# Patient Record
Sex: Male | Born: 1970 | Hispanic: No | Marital: Married | Smoking: Never smoker
Health system: Southern US, Community
[De-identification: ages and names within clinical notes are randomized; demographics above are authoritative.]

## PROBLEM LIST (undated history)

## (undated) DIAGNOSIS — I219 Acute myocardial infarction, unspecified: Secondary | ICD-10-CM

## (undated) DIAGNOSIS — E785 Hyperlipidemia, unspecified: Secondary | ICD-10-CM

## (undated) DIAGNOSIS — Z9289 Personal history of other medical treatment: Secondary | ICD-10-CM

## (undated) DIAGNOSIS — S93699A Other sprain of unspecified foot, initial encounter: Secondary | ICD-10-CM

## (undated) DIAGNOSIS — I10 Essential (primary) hypertension: Secondary | ICD-10-CM

## (undated) DIAGNOSIS — M109 Gout, unspecified: Secondary | ICD-10-CM

## (undated) DIAGNOSIS — I2510 Atherosclerotic heart disease of native coronary artery without angina pectoris: Secondary | ICD-10-CM

## (undated) DIAGNOSIS — Z720 Tobacco use: Secondary | ICD-10-CM

## (undated) HISTORY — PX: ORIF ELBOW FRACTURE: SUR928

## (undated) HISTORY — DX: Hyperlipidemia, unspecified: E78.5

## (undated) HISTORY — DX: Essential (primary) hypertension: I10

## (undated) HISTORY — PX: REPLACEMENT TOTAL KNEE: SUR1224

## (undated) HISTORY — DX: Atherosclerotic heart disease of native coronary artery without angina pectoris: I25.10

## (undated) HISTORY — PX: KNEE ARTHROSCOPY: SUR90

## (undated) HISTORY — DX: Personal history of other medical treatment: Z92.89

## (undated) HISTORY — PX: ORIF FEMUR FRACTURE: SHX2119

## (undated) HISTORY — DX: Other sprain of unspecified foot, initial encounter: S93.699A

## (undated) HISTORY — DX: Acute myocardial infarction, unspecified: I21.9

---

## 2001-01-26 HISTORY — PX: SHOULDER SURGERY: SHX246

## 2004-06-21 ENCOUNTER — Emergency Department: Payer: Self-pay | Admitting: Emergency Medicine

## 2004-07-10 ENCOUNTER — Ambulatory Visit: Payer: Self-pay | Admitting: Family Medicine

## 2004-09-16 ENCOUNTER — Inpatient Hospital Stay (HOSPITAL_COMMUNITY): Admission: RE | Admit: 2004-09-16 | Discharge: 2004-09-22 | Payer: Self-pay | Admitting: Specialist

## 2004-09-16 ENCOUNTER — Ambulatory Visit: Payer: Self-pay | Admitting: Internal Medicine

## 2005-11-08 ENCOUNTER — Emergency Department: Payer: Self-pay | Admitting: Emergency Medicine

## 2005-11-18 ENCOUNTER — Ambulatory Visit: Payer: Self-pay | Admitting: Family Medicine

## 2005-12-26 DIAGNOSIS — I10 Essential (primary) hypertension: Secondary | ICD-10-CM

## 2005-12-26 HISTORY — DX: Essential (primary) hypertension: I10

## 2006-01-11 ENCOUNTER — Ambulatory Visit: Payer: Self-pay | Admitting: Family Medicine

## 2006-02-15 ENCOUNTER — Ambulatory Visit: Payer: Self-pay | Admitting: Family Medicine

## 2006-02-15 LAB — CONVERTED CEMR LAB
BUN: 9 mg/dL (ref 6–23)
CO2: 28 meq/L (ref 19–32)
Calcium: 9.5 mg/dL (ref 8.4–10.5)
Chloride: 106 meq/L (ref 96–112)
Creatinine, Ser: 1.1 mg/dL (ref 0.4–1.5)
GFR calc Af Amer: 97 mL/min
GFR calc non Af Amer: 80 mL/min
Glucose, Bld: 103 mg/dL — ABNORMAL HIGH (ref 70–99)
Potassium: 4.9 meq/L (ref 3.5–5.1)
Sodium: 141 meq/L (ref 135–145)

## 2006-02-22 ENCOUNTER — Ambulatory Visit: Payer: Self-pay | Admitting: Family Medicine

## 2006-05-06 ENCOUNTER — Encounter: Payer: Self-pay | Admitting: Family Medicine

## 2006-05-06 DIAGNOSIS — R7303 Prediabetes: Secondary | ICD-10-CM | POA: Insufficient documentation

## 2006-05-06 DIAGNOSIS — I10 Essential (primary) hypertension: Secondary | ICD-10-CM | POA: Insufficient documentation

## 2006-05-17 ENCOUNTER — Ambulatory Visit: Payer: Self-pay | Admitting: Family Medicine

## 2006-05-17 LAB — CONVERTED CEMR LAB
Glucose, Bld: 90 mg/dL (ref 70–99)
Potassium: 4.3 meq/L (ref 3.5–5.1)

## 2006-05-25 ENCOUNTER — Ambulatory Visit: Payer: Self-pay | Admitting: Family Medicine

## 2006-06-01 ENCOUNTER — Telehealth (INDEPENDENT_AMBULATORY_CARE_PROVIDER_SITE_OTHER): Payer: Self-pay | Admitting: *Deleted

## 2006-06-11 ENCOUNTER — Inpatient Hospital Stay (HOSPITAL_COMMUNITY): Admission: RE | Admit: 2006-06-11 | Discharge: 2006-06-15 | Payer: Self-pay | Admitting: Specialist

## 2006-07-07 ENCOUNTER — Encounter: Payer: Self-pay | Admitting: Specialist

## 2006-07-27 ENCOUNTER — Encounter: Payer: Self-pay | Admitting: Specialist
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
## 2008-03-17 ENCOUNTER — Emergency Department: Payer: Self-pay | Admitting: Emergency Medicine

## 2008-03-17 ENCOUNTER — Encounter: Payer: Self-pay | Admitting: Family Medicine

## 2008-03-21 ENCOUNTER — Ambulatory Visit: Payer: Self-pay | Admitting: Family Medicine

## 2008-03-21 DIAGNOSIS — K219 Gastro-esophageal reflux disease without esophagitis: Secondary | ICD-10-CM | POA: Insufficient documentation

## 2008-03-29 IMAGING — CR DG CHEST 2V
2 series · 2 of 2 positions shown · non-contrast
Comparison: 09/11/04.

CLINICAL DATA: Knee osteoarthritis.  Hypertension.   Preoperative respiratory exam for knee joint replacement. 
 CHEST - 2 VIEW:

[view not recorded (1 of 2)]
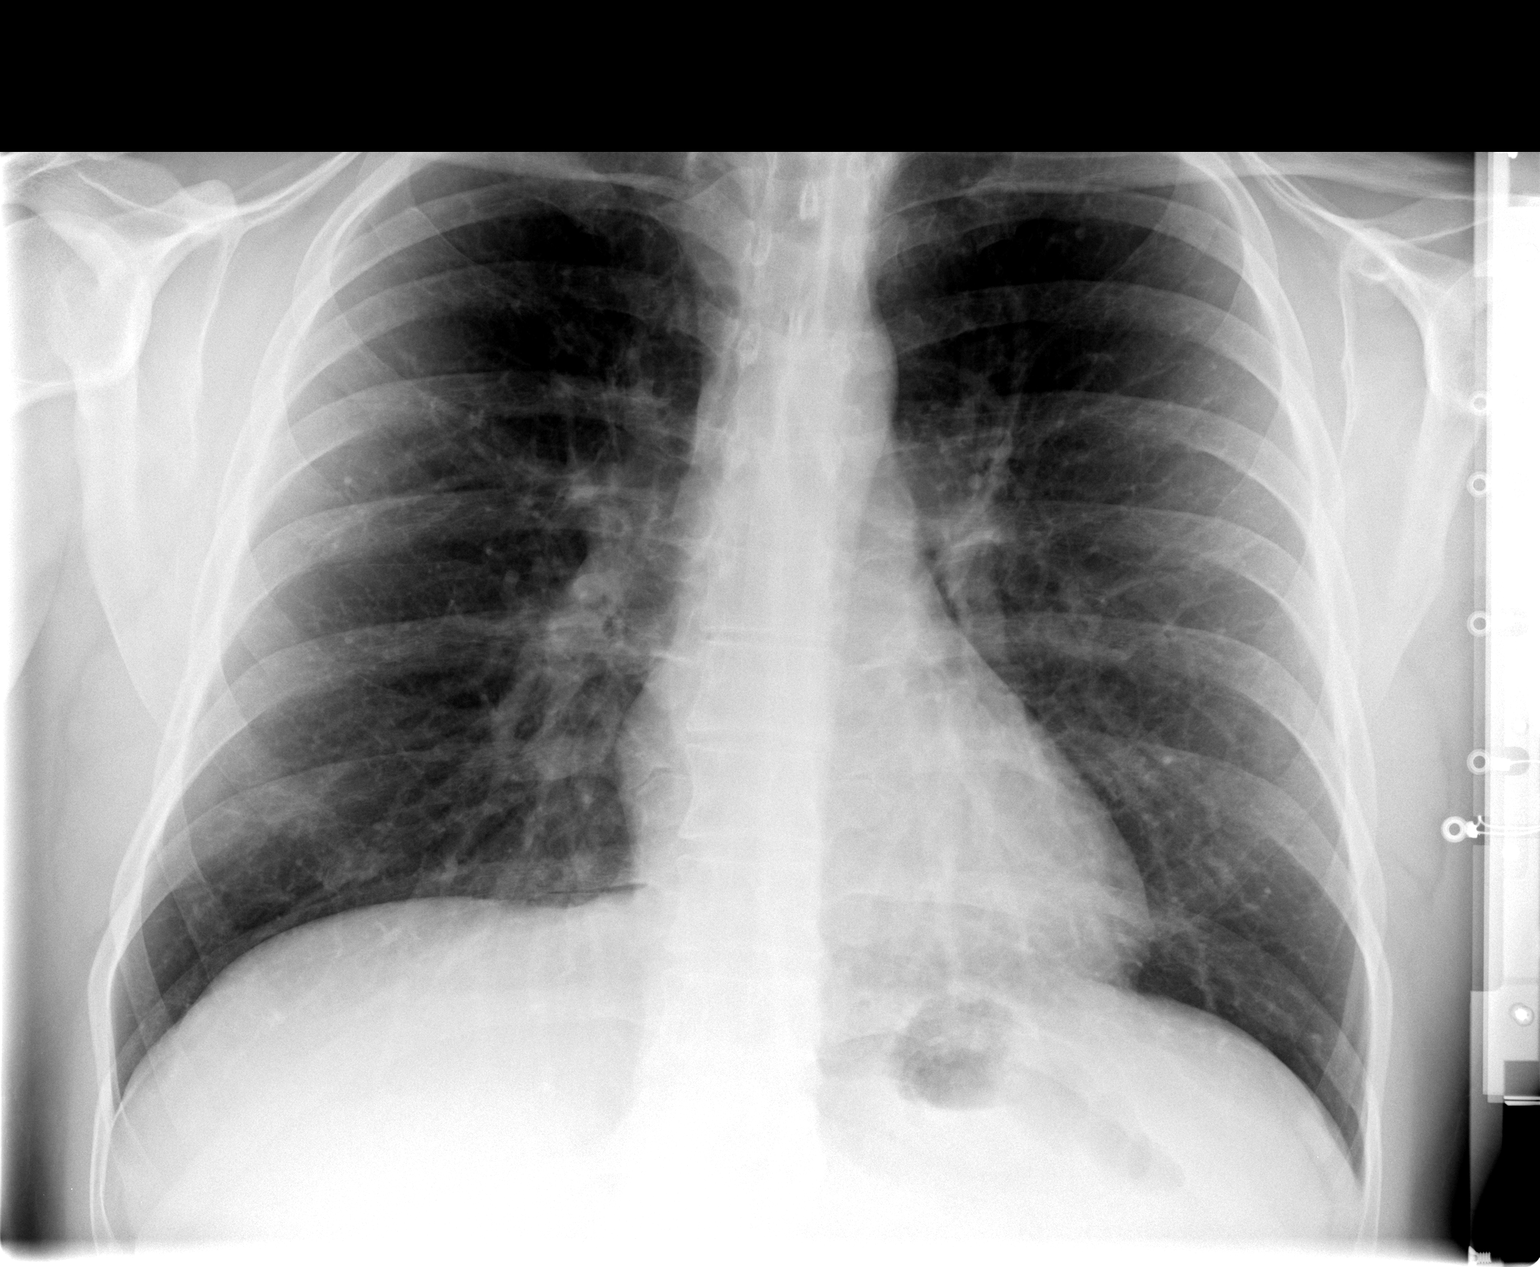

[view not recorded (2 of 2)]
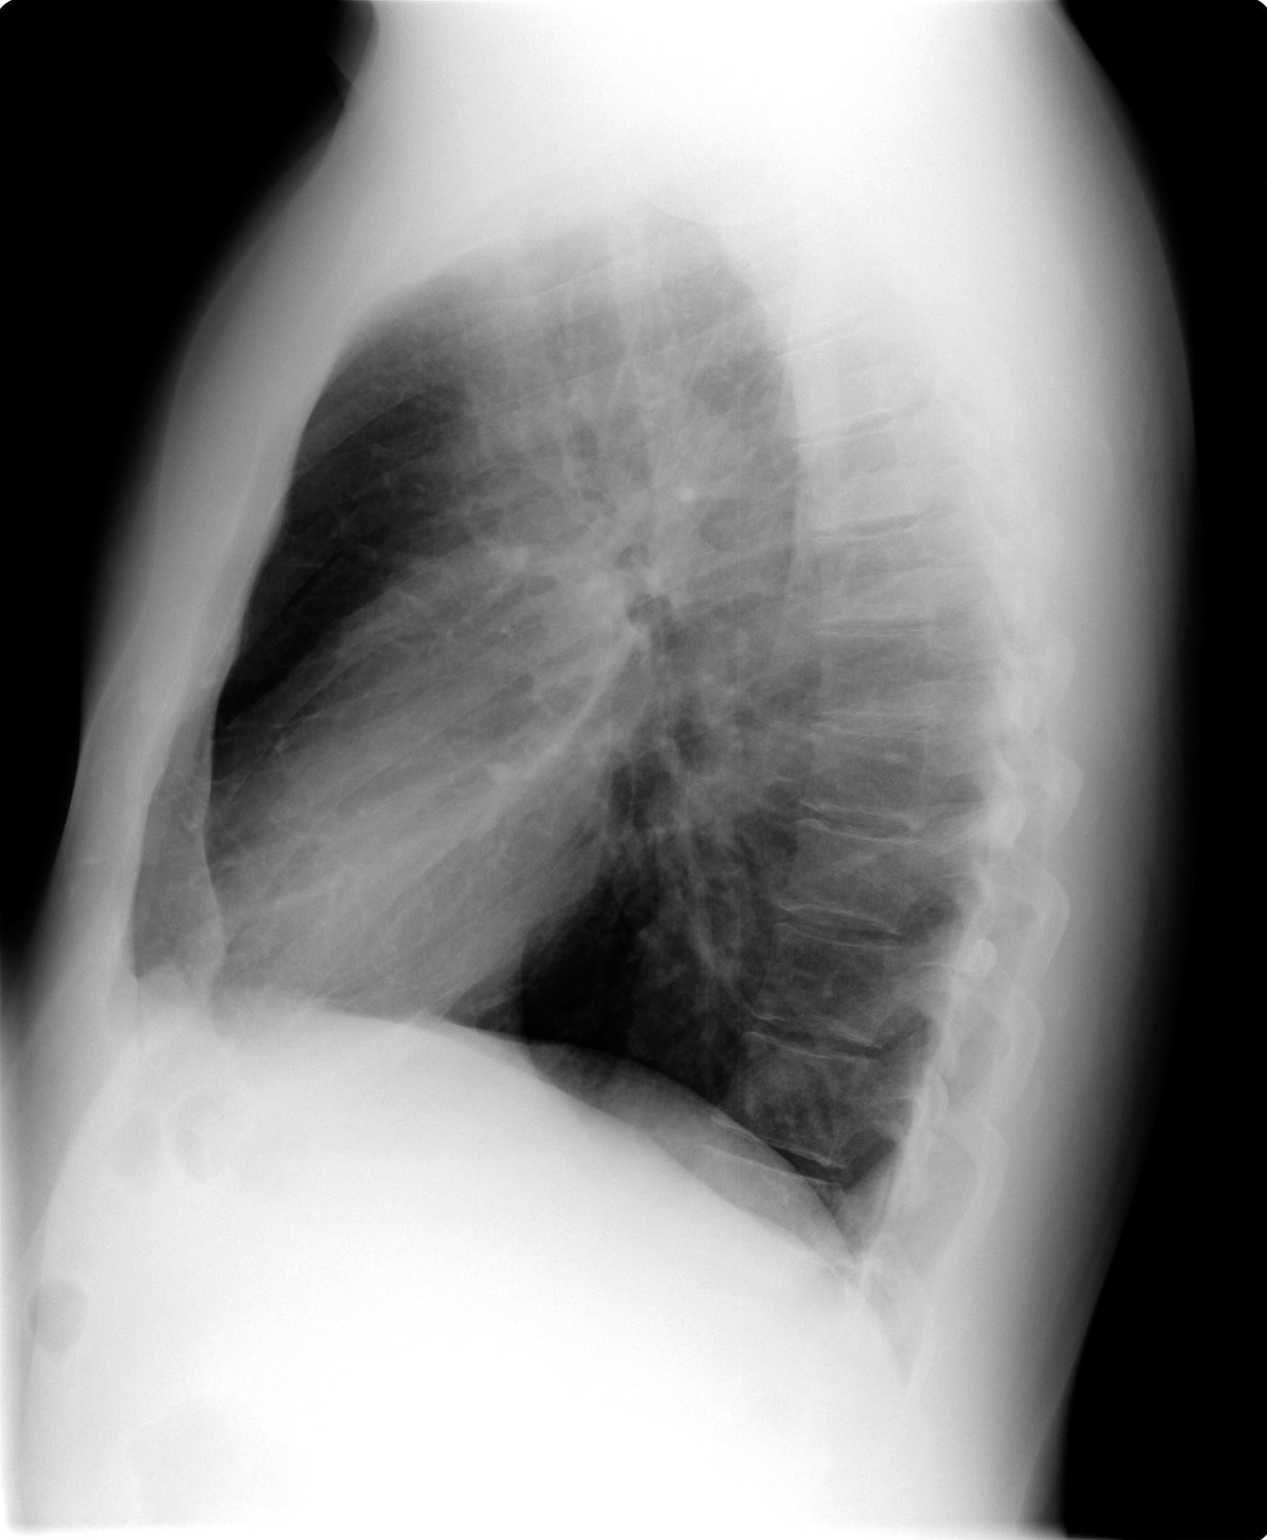

[2 of 2 positions shown; findings below may reference images not displayed]

FINDINGS: The heart size and mediastinal contours are within normal limits.  Both lungs are clear.  The visualized skeletal structures are unremarkable.
IMPRESSION: No active cardiopulmonary disease.

## 2008-12-31 ENCOUNTER — Ambulatory Visit: Payer: Self-pay | Admitting: Family Medicine

## 2009-07-01 ENCOUNTER — Ambulatory Visit: Payer: Self-pay | Admitting: Family Medicine

## 2009-07-01 LAB — CONVERTED CEMR LAB
ALT: 29 units/L (ref 0–53)
AST: 32 units/L (ref 0–37)
Albumin: 4.1 g/dL (ref 3.5–5.2)
Alkaline Phosphatase: 72 units/L (ref 39–117)
BUN: 14 mg/dL (ref 6–23)
Bilirubin, Direct: 0.1 mg/dL (ref 0.0–0.3)
CO2: 30 meq/L (ref 19–32)
Calcium: 9.1 mg/dL (ref 8.4–10.5)
Chloride: 106 meq/L (ref 96–112)
Cholesterol: 168 mg/dL (ref 0–200)
Creatinine, Ser: 0.9 mg/dL (ref 0.4–1.5)
GFR calc non Af Amer: 97.63 mL/min (ref >60)
Glucose, Bld: 87 mg/dL (ref 70–99)
HDL: 37.9 mg/dL — ABNORMAL LOW (ref >39.00)
LDL Cholesterol: 103 mg/dL — ABNORMAL HIGH (ref 0–99)
Potassium: 4.8 meq/L (ref 3.5–5.1)
Sodium: 141 meq/L (ref 135–145)
TSH: 1.4 microintl units/mL (ref 0.35–5.50)
Total Bilirubin: 0.6 mg/dL (ref 0.3–1.2)
Total CHOL/HDL Ratio: 4
Total Protein: 6.9 g/dL (ref 6.0–8.3)
Triglycerides: 138 mg/dL (ref 0.0–149.0)
VLDL: 27.6 mg/dL (ref 0.0–40.0)

## 2009-07-04 ENCOUNTER — Ambulatory Visit: Payer: Self-pay | Admitting: Family Medicine

## 2009-07-12 ENCOUNTER — Encounter: Payer: Self-pay | Admitting: Family Medicine

## 2009-08-28 ENCOUNTER — Encounter (INDEPENDENT_AMBULATORY_CARE_PROVIDER_SITE_OTHER): Payer: Self-pay | Admitting: *Deleted

## 2009-09-17 ENCOUNTER — Encounter: Payer: Self-pay | Admitting: Family Medicine

## 2009-11-12 ENCOUNTER — Encounter: Payer: Self-pay | Admitting: Family Medicine
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
## 2010-02-25 NOTE — Letter (Signed)
Summary: [facility]  [facility]   Imported By: Lanelle Bal 07/30/2009 12:12:05  _____________________________________________________________________  External Attachment:    Type:   Image     Comment:   External Document  Appended Document: [facility] Plantar fasciitis per [facility]

## 2010-02-25 NOTE — Letter (Signed)
Summary: [facility]  [facility]   Imported By: Lanelle Bal 11/25/2009 11:18:10  _____________________________________________________________________  External Attachment:    Type:   Image     Comment:   External Document
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
## 2010-02-25 NOTE — Letter (Signed)
Summary: Nadara Eaton letter  [facility]  [address] [place], [place] [address]   Phone: [phone]  Fax: [phone]       08/28/2009 MRN: [id number]  EDNA GROVER [address] [place], [place]  [address]  Dear Mr. [facility] [place], and [place] announce the retirement of Arta Silence, M.D., from full-time practice at the [facility] office effective July 25, 2009 and his plans of returning part-time.  It is important to Dr. Hetty Ely and to our practice that you understand that [facility] has seven physicians in our office for your health care needs.  We will continue to offer the same exceptional care that you have today.    Dr. Hetty Ely has spoken to many of you about his plans for retirement and returning part-time in the fall.   We will continue to work with you through the transition to schedule appointments for you in the office and meet the high standards that [place] is committed to.   Again, it is with great pleasure that we share the news that Dr. Hetty Ely will return to [facility] at [facility] in October of 2011 with a reduced schedule.    If you have any questions, or would like to request an appointment with one of our physicians, please call us at [phone] and press the option for Scheduling an appointment.  We take pleasure in providing you with excellent patient care and look forward to seeing you at your next office visit.  Our [facility] Physicians are:  Tillman Abide, M.D. Laurita Quint, M.D. Roxy Manns, M.D. Kerby Nora, M.D. Hannah Beat, M.D. Ruthe Mannan, M.D. We proudly welcomed Raechel Ache, M.D. and Eustaquio Boyden, M.D. to the practice in July/August 2011.  Sincerely,  [place] [facility]

## 2010-02-25 NOTE — Assessment & Plan Note (Signed)
Summary: EAR/CLE   Vital Signs:  Patient profile:   40 year old male Height:      69 inches Weight:      215.75 pounds BMI:     31.98 Temp:     97.8 degrees F oral Pulse rate:   76 / minute Pulse rhythm:   regular BP sitting:   140 / 90  (left arm) Cuff size:   large  Vitals Entered By: Sydell Axon LPN (December 31, 2008 2:46 PM)  History of Present Illness: Pt here for ears feeling congested and popping. He is gettting irritated with it and wants something to help. He wennt to [facility] 2 mos ago for right ear pain that was diagnosed with MJ on the right and left ear infection for which he got Abs. He denies fever or chills, headache, ear pain but is sounds occas like in a tunnel, lloud noise bothers him. He has minimal rhinitis, no ST, and no cough, no SOB, no N/V. He has taken nothing.  Problems Prior to Update: 1)  Gerd  (ICD-530.81) 2)  Chest Pain, Atypical  (ICD-786.59) 3)  Abnormal Glucose Nec  (ICD-790.29) 4)  Hypertension  (ICD-401.9)  Medications Prior to Update: 1)  None  Allergies: No Known Drug Allergies  Physical Exam  General:  Well-developed,well-nourished,in no acute distress; alert,appropriate and cooperative throughout examination, nontoxic appearing. Head:  Normocephalic and atraumatic without obvious abnormalities. No apparent alopecia or balding. Sinuses NT. Eyes:  Conjunctiva clear bilaterally.  Ears:  External ear exam shows no significant lesions or deformities.  Otoscopic examination reveals clear canals, tympanic membranes are intact bilaterally without bulging, retraction, inflammation or discharge. Hearing is grossly normal bilaterally. L TM mildly dull, mobility decreased on right. Nose:  External nasal examination shows no deformity or inflammation. Nasal mucosa are pink and moist without lesions or exudates. Mouth:  Oral mucosa and oropharynx without lesions or exudates.  Teeth in good repair. Mild white PND. Neck:  No deformities, masses,  or tenderness noted. Lungs:  Normal respiratory effort, chest expands symmetrically. Lungs are clear to auscultation, no crackles or wheezes. Heart:  Normal rate and regular rhythm. S1 and S2 normal without gallop, murmur, click, rub or other extra sounds.   Impression & Recommendations:  Problem # 1:  URI (ICD-465.9) Assessment New See instructions.  Problem # 2:  DYSFUNCTION OF EUSTACHIAN TUBE (ICD-381.81) Assessment: New See instructions.  Patient Instructions: 1)  Take Guaifenesin by going to [facility], [facility] or [facility] and getting MUCOUS RELIEF EXPECTORANT (400mg ), take 11/2 tabs by mouth AM and NOON. 2)  Drink lots of fluids anytime taking Guaifenesin.  3)  Gargle  4)  Take IBP 600mg  three times a day after meals. 5)  Afrin 3 squirts one minute apart three times a day for three days.  Prior Medications (reviewed today): None Current Allergies (reviewed today): No known allergies   Appended Document: EAR/CLE Make appt for Comp Exam in 6 mos, labs prior.
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
## 2010-02-25 NOTE — Letter (Signed)
Summary: [facility]  [facility]   Imported By: Lanelle Bal 10/09/2009 08:47:07  _____________________________________________________________________  External Attachment:    Type:   Image     Comment:   External Document

## 2010-06-13 NOTE — Discharge Summary (Signed)
Eric Huff, Eric Huff             ACCOUNT NO.:  [id number]   MEDICAL RECORD NO.:  [id number]          PATIENT TYPE:  INP   LOCATION:  1605                         FACILITY:  [facility]   PHYSICIAN:  Erasmo Leventhal, M.D.DATE OF BIRTH:  [birth year]   DATE OF ADMISSION:  06/11/2006  DATE OF DISCHARGE:  06/15/2006                               DISCHARGE SUMMARY   ADMISSION DIAGNOSIS:  End-stage osteoarthritis, right knee.   DISCHARGE DIAGNOSIS:  End-stage osteoarthritis, right knee.   OPERATION/PROCEDURE:  Total knee arthroplasty, right knee.   BRIEF HISTORY:  This is a 40 year old gentleman with a history of  traumatic arthritis of the right knee.  Has failed to respond to  conservative measures to alleviate his pain despite his young age.  He  is now a candidate for total knee arthroplasty as he has failed all  other treatment options.  We discussed treatment options, benefits and  risks with the patient extensively.  Questions were invited and  answered.  Surgery to go ahead as scheduled.  He has had a problem with  staph infections in the past and will cover with vancomycin both  preoperatively and postoperatively.   LABORATORY VALUES:  Admission CBC within normal limits.  He ran mildly  elevated white count at admission high of 13.1, back down to 11.7 at  discharge.  Hemoglobin/hematocrit reached a low of 11.5 and 33.3 on May  19,  were up to 11.06 and 34.1 on the May 20.  PT/PTT within normal  limits.  Admission CMET within normal limits with the exception of  slightly elevated glucose at 109.  He had mildly low sodium on the May  17 and May 18 at 134 and 133; was back up to normal on the May 19.  Ran  mildly high glucose through the admission in the 130-140 range.  Urinalysis within normal limits.   COURSE IN THE HOSPITAL:  The patient tolerated the operative seizure  well.  First postoperative day, vital signs stable.  Neurovascular  status intact.  Hemovac drain was  removed.  Dressing was dry and the  patient was started on total knee protocol.  Second postoperative day,  vital signs stable, afebrile.  Hemoglobin 12.1.  Dressing was changed.  Wound was healing well without redness.  No calf tenderness, cords or  signs of DVT and he was continued in total knee protocol.  Third  postoperative day, vital signs stable.  He was afebrile.  Temperature  max the night before 101.3.  WBC 13.1, hemoglobin 11.5, hematocrit 33.3.  BMET within normal limits with the exception of glucose high at 141.  Lungs clear with decreased sounds at the right base.  Bowel sounds  active.  Heart sounds normal.  Dressing changed.  Wound benign.  Calves  negative.  He was started on incentive spirometry.  Cough and deep  breathe q. 30 minutes and CBC was rechecked in the a.m..  And on the  fourth postoperative day, vital signs were stable.  Temperature 99.2,  max 100 the previous evening.  Oxygen saturation 97% on room air.  White  count down to 11.7,  hematocrit and hemoglobin stable.  Lungs clear with  good sounds in all fields.  Bowel sounds active.  Heart sounds normal.  Calves negative.  Wound benign.  The patient was subsequently discharged  home for followup in the office.   CONDITION ON DISCHARGE:  Improved.   DISCHARGE MEDICATIONS:  1. Percocet 5/325 one to two q.6 h p.r.n. pain.  2. Robaxin 500 one p.o. daily p.r.n. spasm.  3. Lovenox 30 mg one shot each day at 6 a.m. and 6 p.m.  4. Trinsicon one twice a day for anemia.  5. Bactrim DS 1 pill twice a day as prophylaxis against infection.  6. Lisinopril 5 mg daily.   DISCHARGE INSTRUCTIONS:  He will return to the office in two weeks.  He  is instructed to do his home exercise.  Keep his wound clean and dry and  call if any problems or questions arise.      Jaquelyn Bitter. Chabon, P.A.    ______________________________  Erasmo Leventhal, M.D.    SJC/MEDQ  D:  07/20/2006  T:  07/21/2006  Job:  [id number]

## 2010-06-13 NOTE — Op Note (Signed)
NAMEJAWANZA, Eric Huff             ACCOUNT NO.:  [id number]   MEDICAL RECORD NO.:  [id number]          PATIENT TYPE:  INP   LOCATION:  0007                         FACILITY:  [facility]   PHYSICIAN:  Erasmo Leventhal, M.D.DATE OF BIRTH:  1970/08/15   DATE OF PROCEDURE:  09/16/2004  DATE OF DISCHARGE:                                 OPERATIVE REPORT   PREOPERATIVE DIAGNOSES:  Severe left knee post-traumatic arthritis with  valgus malalignment and retained hardware deep, tibia and femur.   POSTOPERATIVE DIAGNOSES:  Severe left knee post-traumatic arthritis with  valgus malalignment and retained hardware deep, tibia and femur.   PROCEDURE:  Left total knee arthroplasty, hardware removal of fibula and  femur, hardware removal of tibia, both deep.   SURGEON:  Erasmo Leventhal, M.D.   FIRST ASSISTANT:  Madlyn Frankel. Charlann Boxer, M.D.   SECOND ASSISTANT:  Jaquelyn Bitter. Chabon, P.A.-C.   COMPLICATIONS:  Nerve block with general.   ESTIMATED BLOOD LOSS:  Approximately 200 mL.   DRAINS:  Two medium Hemovac.   COMPLICATIONS:  None.   TOURNIQUET TIME:  Initial tourniquet time was 2 hours at 350 mmHg. The  tourniquet was then deflated, lower extremity was reperfused for 12 minutes  and then the tourniquet was reinflated for another 30 minutes.   COMPLICATIONS:  None.   DISPOSITION:  To PACU stable.   OPERATIVE IMPLANTS:  All components were cemented. DePuy, [employer]. PC3 size 4 femur, 8 mm lateral augmentation, size 4 MTB tibia, 4 x  12.5 mm rotating platform tibial insert. 35 mm all polyethylene patella.   DESCRIPTION OF PROCEDURE:  The patient was counseled in the holding area,  correct side was identified, IV started, antibiotics were given. Femoral  nerve block was administered. He was then taken to the operating room,  placed in supine position, general anesthesia, Foley catheter was placed  utilizing sterile technique by the OR circulating nurse. All extremities  were  well padded and bumped, left knee was examined. He had 5 degrees of  recurvatum. He seemed to be stable to varus stress and full extension and 30  degrees of flexion with slight laxity to valgus stress at full extension, 30  degrees of flexion. However, felt there was no significant posterolateral  instability and he had good collateral ligament stability. Old scars were  identified and marked. The lower extremity was elevated, exsanguinated with  esmarch, extremity was inflated to 350 mmHg. An old anterior midline  incision made through the skin and subcutaneous tissue keeping the skin  flaps well vascularized. Medial and lateral soft tissue flaps were  developed. The patient had a 21 degree valgus malalignment preoperatively.  Based upon that, we then did a lateral approach with a lateral parapatellar  arthrotomy releasing the soft tissue subperiosteally laterally including the  IT band and the lateral capsule. At this point in time as one would  anticipate, we encountered quite a stiff knee, we are doing soft tissue  releases there periodically. The patella was then displaced medially, the  knee was flexed, end-stage arthritic change, large osteophytes present.  There was no ACL  graft visible.   The tibial screw was then identified and it was then removed with a  screwdriver.   The femoral screw was now identified and it was removed with a screwdriver.   The posterior cruciate ligament was resected. A starting hold made in the  distal femur, canal was irrigated and intramedullary rod was gently placed.  He had a degenerative worn out lateral femoral condyle. With a 5 degree  valgus cut, we took a minimal resection off the medial side which was 4 mm  and then on the lateral side we noted that we had a deficient lateral  femoral condyle and ended up going back to take a skim cut at 8 mm with  lateral augmentation wedge. Anterior and posterior rotation marks were made  for 30 degrees of  external rotation of the femoral component and anterior  and posterior cuts were made in the distal femur.   Attention was directed to the proximal tibia, again quite a bit of soft  tissue work was done, medial and lateral meniscal remnants were removed.  Posterior neurovascular structures were thought of and protected throughout  the entire case. The popliteus was found to be extremely tight and was  released from the lateral side. The fibular collateral ligament was palpated  and protected throughout the entire case as was the medial collateral  ligament. Posteromedial and posterior femoral osteophytes were removed under  direct visualization to fit the posterior capsule. Tibial eminence and  spines were markedly hypoplastic and this was resected. He was found to have  a more deficient lateral side. At this point, we took an 8 mm cut based upon  the medial side which was least deficient. After marking the center of the  tibia, making a starting hole with an intramedullary rod in place. This was  done in a zero degree slope. At this time, we then checked with balancing  blocks and noted that we were tight in extension and took another 2 mm off  the proximal tibia. At this time, we had a 12.5 insert and we felt we had  good balance and extension.   Again the tibia was found to be a size #4. The box cut was then made as was  anterior and posterior chamfers. We initially felt we were going to have to  stem the implant, we reamed up and then after several manipulations opted to  go to a TC3 femoral component with an 8 mm lateral wedge realizing excellent  stability and fixation and did not require a femoral stem. At this time with  a size 4 femur with a 12.5 insert, we had good range of motion and soft  tissue balance, patella tracking was anatomic. Rotation marks were made, the  knee was then flexed and the tibia was profoundly prepared with a reamer and a punch. The patella was resected to  the appropriate level leaving 15 mm of  thickness found to be a size 35 and the insertion holes were then made. At  this point in time, we were two hours into the tourniquet, tourniquet was  deflated, we allowed the lower extremity to reperfuse for the next 12  minutes and as hemostasis was obtained also soft tissue was irrigated.  During this time, the components were set up on the back table and cement  was mixed appropriately utilizing modern cement technique. After 12 minutes  of reperfusion, the lower extremity was elevated, re-exsanguinated with an  Esmarch, and re-irrigated. At this  time, we cemented in all components  utilizing modern cement technique. The size #4 tibia, size 4 TC3 femoral  component with an 8 mm lateral augmentation wedge and with a 12.5 tibial  insert. We had excellent valgus alignment, we had excellent balance and  flexion extension with varus and valgus stability and flexion extension gaps  well balanced. After the cement had cured, we removed the 12.5 trial, excess  cement was removed, hemostasis obtained and a size 4, 12.5 mm tibial insert  was implanted. The knee was again checked with excellent alignment in the AP  planes, varus and valgus stress was found to be excellent as was flexion  extension gaps. The wounds were copiously irrigated. Two medium Hemovac  drains were placed. The knee was irrigated during the closure with normal  saline. Sequential closure of layers was done, the lateral arthrotomy closed  with Vicryl.  Again, anatomic patellofemoral tracking well balanced medial  and lateral. The subcu closed with Vicryl. Due to the fact that he had old  scars, we went ahead and used staples on the skin, sterile dressing applied,  drains hooked to suction. Another gram of Ancef given intravenously at the  end of the case. Excellent circulation in the foot and ankle at the end of  the case. A sterile dressing was applied to the knee, lower extremity was   elevated. Ice pack and knee immobilizer applied in full extension. The  patient was then awakened. Sponge and needle count were correct. There were  no complications or problems. Following the surgical procedure when the  patient was awake enough, his lower extremity neurovascular examination was  checked making sure that we had dorsiflexion of the foot and ankle  confirming perineal nerve was functioning and it was. Following this, the  patient was recuperated. Sponge and needle counts correct, no complications  or problems.   To decrease surgical time and help with decision making throughout the  entire difficult procedure, Dr. Molli Hazard Olin's assistance was needed as was  the assistance of Mr. Leilani Able, P.A.-C.           ______________________________  Erasmo Leventhal, M.D.     RAC/MEDQ  D:  09/16/2004  T:  09/16/2004  Job:  [id number]

## 2010-06-13 NOTE — Op Note (Signed)
NAMETRUTH, BAROT             ACCOUNT NO.:  [id number]   MEDICAL RECORD NO.:  [id number]          PATIENT TYPE:  INP   LOCATION:  X005                         [facility]ACILITY:  [facility]   PHYSICIAN:  Erasmo Leventhal, M.D.DATE OF BIRTH:  1970-11-29   DATE OF PROCEDURE:  06/11/2006  DATE OF DISCHARGE:                               OPERATIVE REPORT   PREOPERATIVE DIAGNOSIS:  Right knee retained hardware, end-stage  arthritis.   POSTOPERATIVE DIAGNOSIS:  Right knee retained hardware, end-stage  arthritis.   PROCEDURE:  Right knee removal of hardware, total knee arthroplasty.   SURGEON:  Erasmo Leventhal, M.D.   ASSISTANT:  Jaquelyn Bitter. Chabon, PA-C.   ANESTHESIA:  Spinal with monitored anesthesia care.   ESTIMATED BLOOD LOSS:  Less than 50 cc.   DRAINS:  Hemovac x1.   COMPLICATIONS:  None.   TOURNIQUET TIME:  One hour and 50 minutes with 300 mmHg.   DISPOSITION:  To PACU stable.   OPERATIVE INDICATIONS:  DePuy [employer], size 4 TC3 femur, size  4 MBT revision tray, size 4 TC3 17.5 mm insert, a 38 mm patella, all  cemented.   OPERATIVE DETAILS:  Patient counseled in the holding area.  The correct  side is identified.  An IV started.  Taken to the operating room with IV  vancomycin administered due to history of previous staph infections in  the past.  Spinal anesthetic was administered.  A Port-A-Cath was  placed, utilizing a sterile technique by the OR circulating nurse.  All  extremities were well padded and bumped.  The right knee was examined.  He had full extension.  He had 5 degrees of recurvatum.  He was stable  to varus stress from 0-30 degrees of flexion.  With the valgus stress,  he had 2+ opening at full extension and 3+ at 30 degrees of flexion.  He  was elevated, prepped with DuraPrep, and draped in a sterile fashion.  Exsanguinated with Esmarch.  The tourniquet was inflated to 300 mmHg.  He was prepped in the midline.  An incision was made  through the skin  and subcutaneous tissue, incorporating one of his previous incisions.  Mediolateral soft tissue flaps were developed at the appropriate level.  A medial parapatellar arthrotomy was performed.  A proximal medial soft  tissue release was done due to his varus malalignment.  Large  osteophytes were removed from the proximal medial tibia.  He had end-  stage arthritis changes, bone against bone.  The previous table staple  was removed from the lateral aspect of the distal femur.   The previous interference screw was removed from the proximal tibia.  A  starting hole was made in the distal femur.  The canal was irrigated.  The effluent was clear.  Intramedullary rod was gently placed.  I chose  a 5 degree valgus cut and took a 10 mm cut off the distal femur.  The  distal femur was found to be a size #5.  Rotation marks were set and cut  to fit a size #5.  The mediolateral menisci removed.  Geniculate vessels  were coagulated.  Posterior neurovascular structures were tied off and  protected throughout the entire case.  The tibial eminence was resected.  The proximal tibia was found to be a size 4.  The central aspect was  noted.  Reamer, step reamer, and irrigated.  The intramedullary rod was  gently placed.  I chose a 10 mm cut off the least deficient side, which  was the lateral side at a 0 degree slope.   Posterior medial and posterolateral femoral osteophytes were removed  under direct visualization.  At this time, with flexion and extension  blocks and full extension, we had varus opening in full extension with a  15 mm insert, consistent with chronic LCL deficiency.   Based upon this, it was opted to go to an MTB revision tray and a TC3  femur.   We were tight in flexion but okay in extension.  Therefore, the femur  was now downsized to a size #4.  We did prepare the proximal tibia for a  size #4 MTB revision with a reamer, followed by a punch.   The femoral box  cut was then prepared in a standard fashion for a TC3  femur.  At this time, with a size 4 MBT revision tray, size 4 TC3 femur,  15 insert, we had a good range of motion with soft tissue balance.  The  patella was found to be a size 38. The appropriate amount of bones were  set, the locking holes were made.  At this point in time, all components  were cemented into place after pulsatile lavage.  Incised, utilizing  modern cement technique.  A size #4 MTB revision tibial tray, size #4  TC3 femur with a 38 patella.  After the cement had cured, we did trials,  and we initially felt a 15 mm insert was appropriate.  The 15 mm insert  was implanted.  At this time, though, he had 5 degrees of recurvatum,  flexion to 120, limited by the drapes.  Varus full extension.  He was  stable at 30 degrees of flexion.  At 60 degrees of flexion, he had trace  to 1+ opening.  At this time, I felt it would be best to upgrade him to  a 17.5 insert.  At this point in time, the tibial tray was removed.  The  knee was copiously irrigated.  All excess cement was removed, and a  final 7.5 TC3 insert was implanted.  We now had full extension and  flexion __________ .  He was stable to varus and valgus stress at 30,  60, and 90 degrees of flexion.  Patellofemoral tracking was anatomic.  The knee was then copiously irrigated clear.  A medium Hemovac drain was  placed.  Sequential closure of the layers was done.  The arthrotomy was  closed at 90 degrees of flexion with subcu Vicryl.  The skin was closed  with staples due to the fact that he had multiple surgical procedures in  the past.  Sterile dressing was applied to the knee.  The drain was  hooked to suction.  The tourniquet was deflated.  He had normal  circulation of the foot and the ankle at the end of the case.  He was  awakened.  He was taken to the recovery room and PACU in stable condition.  Also noted throughout the entire case, as we were checking  him for  varus instability, we were very cautious of his common peroneal  nerve and made sure it was not over-stressed.   To help with surgical technique and decision-making, Mr. Leilani Able,  PA-C's, assistance, was needed throughout the entire case.   Sponge and needle count was correct.  Taken from the operating room to  the PACU in stable condition.           ______________________________  Erasmo Leventhal, M.D.     RAC/MEDQ  D:  06/11/2006  T:  06/11/2006  Job:  [id number]
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
## 2010-06-13 NOTE — H&P (Signed)
Eric Huff, Eric Huff             ACCOUNT NO.:  [id number]   MEDICAL RECORD NO.:  [id number]           PATIENT TYPE:   LOCATION:                                 FACILITY:   PHYSICIAN:  Erasmo Leventhal, M.D.DATE OF BIRTH:  [birth year]   DATE OF ADMISSION:  DATE OF DISCHARGE:                              HISTORY & PHYSICAL   DATE OF SURGERY:  Jun 11, 2006   CHIEF COMPLAINT:  End-stage osteoarthritis right knee.   BRIEF HISTORY:  This is a 40 year old gentleman with a history of  traumatic arthritis of his right knee that has failed to respond to  conservative measures to alleviate his pain.  After discussion of  treatment benefits, risks and options the patient now scheduled for  total knee arthroplasty of his right knee.  He has had a problem with  staph infections in the past and we will cover him with vancomycin  preoperatively and postoperatively.  Today on his H&P he is noted to  have some cellulitis and possible ingrown hair on his right forearm.  This is unroofed.  There is no abscess.  Cultures are obtained and he is  placed on Septra DS one b.i.d.  We will check him again next Wednesday  prior to the surgery, and there is any question surgery will be  postponed until this is clear up, but if it is cleared up then we will  proceed with surgery by covering with the vancomycin preoperatively and  postoperatively.  Otherwise, surgery to go ahead as scheduled.  He has  received medical clearance from his medical doctor, who is at [facility]  [facility], Dr. Laurita Quint.   PAST MEDICAL HISTORY:  Drug allergies:  None.   Current medications:  1. Etodolac 400 mg once a day.  2. Lisinopril 5 mg once a day.   Previous surgery:  1. Left and right ACL reconstruction.  2. Open reduction internal fixation left femur and left elbow.  3. Left total knee arthroplasty.  4. Left shoulder surgery.   Physical exam positive for hypertension.   FAMILY HISTORY:   Negative.   SOCIAL HISTORY:  The patient is married.  He works.  He does not smoke,  does not drink.   REVIEW OF SYSTEMS:  CENTRAL NERVOUS SYSTEM:  Negative for headache,  blurred vision or dizziness.  PULMONARY:  Negative for shortness breath,  PND, orthopnea.  CARDIOVASCULAR:  Positive for hypertension.  Negative  for chest pain or palpitation.  GI:  Negative for ulcers, hepatitis.  GU:  Negative for urinary tract difficulty.  MUSCULOSKELETAL:  Positive  as in HPI.   PHYSICAL EXAMINATION:  VITAL SIGNS:  BP 130/90, respirations 16, pulse  72 and regular.  GENERAL APPEARANCE:  This is a well-nourished gentleman in no acute  distress.  HEENT:  Head normocephalic.  Nose patent.  Ears patent.  Pupils equal,  round and react to light.  Throat without injection.  NECK:  Supple without adenopathy.  Carotids 2+ without bruit.  CHEST:  Clear to auscultation.  No rales or rhonchi.  Respirations 16.  HEART:  Regular rate and  rhythm at 72 beats per minute without murmur.  ABDOMEN:  Soft with active bowel sounds.  No masses or organomegaly.  NEUROLOGIC:  The patient alert and oriented to time, place and person.  Cranial nerves II-XII grossly intact.  EXTREMITIES:  Shows the right knee with a varus deformity, 0-135 degree  range of motion.  Neurovascular status is intact.  Dorsalis pedis and  posterior tibialis pulses are 2+.  His right forearm shows cellulitis,  no abscess.  Cultures are obtained.  He is placed on Bactrim DS and will  see him back next week prior to his surgery to make sure that this is  resolved.   X-rays show end-stage osteoarthritis right knee.   IMPRESSION:  End-stage osteoarthritis right knee.   PLAN:  Total knee arthroplasty right knee.      Jaquelyn Bitter. Chabon, P.A.    ______________________________  Erasmo Leventhal, M.D.    SJC/MEDQ  D:  06/02/2006  T:  06/02/2006  Job:  [id number]

## 2010-08-28 ENCOUNTER — Ambulatory Visit (INDEPENDENT_AMBULATORY_CARE_PROVIDER_SITE_OTHER): Payer: PRIVATE HEALTH INSURANCE | Admitting: Family Medicine

## 2010-08-28 ENCOUNTER — Encounter: Payer: Self-pay | Admitting: Family Medicine

## 2010-08-28 DIAGNOSIS — M629 Disorder of muscle, unspecified: Secondary | ICD-10-CM

## 2010-08-28 DIAGNOSIS — S93699A Other sprain of unspecified foot, initial encounter: Secondary | ICD-10-CM

## 2010-08-28 MED ORDER — TRAMADOL HCL 50 MG PO TABS: 50.0000 mg | ORAL_TABLET | Freq: Four times a day (QID) | ORAL | Status: DC | PRN

## 2010-08-28 NOTE — Patient Instructions (Signed)
F/u 3 weeks with Dr. Regan Llorente 

## 2010-08-28 NOTE — Progress Notes (Signed)
  Subjective:    Patient ID: Eric Huff, male    DOB: Jun 06, 1970, 40 y.o.   MRN: [id number]  HPI  Pleasant gentleman who was having some heel pain on the RIGHT previously, and last Sunday was jumping up and down and running in place, and subsequently later that evening had some significant amount of swelling on the plantar aspect of that foot and primarily along the plantar fascial tissue plane in the midfoot region. He also had relief of his symptoms and his heel. Right now he is significantly limping, having to walk on the outside of his foot. He was able to work for 2 days, but was not able to work today. 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  Has had some PF in the left before, then on Sunday night, started to jog in place.   The PMH, PSH, Social History, Family History, Medications, and allergies have been reviewed in [facility], and have been updated if relevant.  Review of Systems REVIEW OF SYSTEMS  GEN: No fevers, chills. Nontoxic. Primarily MSK c/o today. MSK: Detailed in the HPI GI: tolerating PO intake without difficulty Neuro: No numbness, parasthesias, or tingling associated. Otherwise the pertinent positives of the ROS are noted above.      Objective:   Physical Exam   Physical Exam  Blood pressure 140/80, pulse 59, temperature 98.5 F (36.9 C), temperature source Oral, height 5\' 9"  (1.753 m), weight 233 lb 12.8 oz (106.051 kg), SpO2 99.00%.  GEN: WDWN, NAD, Non-toxic, A & O x 3 HEENT: Atraumatic, Normocephalic. Neck supple. No masses, No LAD. Ears and Nose: No external deformity. EXTR: No c/c/e NEURO Normal gait.  PSYCH: Normally interactive. Conversant. Not depressed or anxious appearing.  Calm demeanor.   The colon RIGHT colon nontender at the calcaneal insertion of the plantar fascia. Nontender calcaneal squeeze. Nontender at the malleoli. Nontender at the peroneal or posterior tibialis tendons. Nontender at the Achilles. Nontender throughout the bony  anatomy of the forefoot and midfoot. There is some significant swelling around the medial aspect of the foot and tenderness notably along the midportion of the plantar fascia.       Assessment & Plan:   1. Plantar fascia rupture     Incomplete rupture of the medial aspect of the plantar fascia. Significant swelling. I am placing the patient in a short Cam Walker boot, and going to make him partial nonweightbearing for the next few weeks. He has crutches at home. Am also going to restrict his driving, and restriction to light duty only without the ability to use or drive his truck at work. Recheck in 3 weeks.

## 2010-08-29 ENCOUNTER — Telehealth: Payer: Self-pay | Admitting: *Deleted

## 2010-08-29 DIAGNOSIS — S93699A Other sprain of unspecified foot, initial encounter: Secondary | ICD-10-CM | POA: Insufficient documentation

## 2010-08-29 HISTORY — DX: Other sprain of unspecified foot, initial encounter: S93.699A

## 2010-08-29 NOTE — Telephone Encounter (Signed)
Pt has dropped off FMLA forms and insurance forms for completion. Forms are on your desk.

## 2010-08-31 NOTE — Telephone Encounter (Signed)
Noted ;will complete

## 2010-09-01 DIAGNOSIS — Z0279 Encounter for issue of other medical certificate: Secondary | ICD-10-CM

## 2010-09-22 ENCOUNTER — Encounter: Payer: Self-pay | Admitting: *Deleted

## 2010-09-22 ENCOUNTER — Telehealth: Payer: Self-pay | Admitting: *Deleted

## 2010-09-22 ENCOUNTER — Encounter: Payer: Self-pay | Admitting: Family Medicine

## 2010-09-22 ENCOUNTER — Ambulatory Visit (INDEPENDENT_AMBULATORY_CARE_PROVIDER_SITE_OTHER): Payer: PRIVATE HEALTH INSURANCE | Admitting: Family Medicine

## 2010-09-22 DIAGNOSIS — S93699A Other sprain of unspecified foot, initial encounter: Secondary | ICD-10-CM

## 2010-09-22 DIAGNOSIS — M629 Disorder of muscle, unspecified: Secondary | ICD-10-CM

## 2010-09-22 NOTE — Progress Notes (Signed)
  Subjective:    Patient ID: Eric Huff, male    DOB: [birth year], 40 y.o.   MRN: [id number]  HPI  Eric Huff, a 40 y.o. male presents today in the office for the following:    F/u incomplete PF rupture:  3 weeks in CAM walker, short, has been off of his foot. 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 Foot feels much better. Walking now without a limp. A little bit of discomfort with on it for a long time out of CAM   Review of Systems REVIEW OF SYSTEMS  GEN: No fevers, chills. Nontoxic. Primarily MSK c/o today. MSK: Detailed in the HPI GI: tolerating PO intake without difficulty Neuro: No numbness, parasthesias, or tingling associated. Otherwise the pertinent positives of the ROS are noted above.      Objective:   Physical Exam   Physical Exam  Blood pressure 150/88, pulse 89, temperature 97.8 F (36.6 C), temperature source Oral, height 5\' 9"  (1.753 m), weight 242 lb (109.77 kg), SpO2 98.00%.  GEN: Well-developed,well-nourished,in no acute distress; alert,appropriate and cooperative throughout examination HEENT: Normocephalic and atraumatic without obvious abnormalities. Ears, externally no deformities PULM: Breathing comfortably in no respiratory distress EXT: No clubbing, cyanosis, or edema PSYCH: Normally interactive. Cooperative during the interview. Pleasant. Friendly and conversant. Not anxious or depressed appearing. Normal, full affect.  Echymosis: no Edema: no ROM: full LE B Gait: heel toe, non-antalgic MT pain: no Callus pattern: none Lateral Mall: NT Medial Mall: NT Talus: NT Navicular: NT Calcaneous: NT Metatarsals: NT 5th MT: NT Phalanges: NT Achilles: NT Plantar Fascia: tender, medial along PF, mild Fat Pad: NT Peroneals: NT Post Tib: NT Great Toe: Nml motion Ant Drawer: neg Sensation: intact       Assessment & Plan:   1. Plantar fascia rupture    Mostly heeled, transition out of CAM CTW Arch strap with  work  Refer to the patient instructions sections for details of plan shared with patient.

## 2010-09-22 NOTE — Patient Instructions (Signed)
Please read handouts on Plantar Fascitis.  STRETCHING and Strengthening program critically important. -start gentle right now  Strengthening on foot and calf muscles as seen in handout. Calf raises, 2 legged, then 1 legged. Foot massage with tennis ball. Ice massage.  Towel Scrunches: get a towel or hand towel, use toes to pick up and scrunch up the towel.  Marble pick-ups, practice picking up marbles with toes and placing into a cup  NEEDS TO BE DONE EVERY DAY  Wear your custom orthotics No easily bendable shoes.

## 2010-09-22 NOTE — Telephone Encounter (Signed)
Jasmine (Conservation officer, nature) with [employer] is doing a return to work physical on the patient today. Jasmine needs a note stating that patient is able to return to work on 09/21/08 without restrictions. Leavy Cella would like this faxed to her attention to [phone]. Call if you have any questions.

## 2010-09-22 NOTE — Telephone Encounter (Signed)
Letter faxed to [facility] for patient

## 2012-12-28 ENCOUNTER — Ambulatory Visit (INDEPENDENT_AMBULATORY_CARE_PROVIDER_SITE_OTHER): Payer: PRIVATE HEALTH INSURANCE | Admitting: Family Medicine

## 2012-12-28 ENCOUNTER — Encounter: Payer: Self-pay | Admitting: Family Medicine

## 2012-12-28 VITALS — BP 158/100 | HR 74 | Temp 98.6°F | Wt 241.5 lb

## 2012-12-28 DIAGNOSIS — J020 Streptococcal pharyngitis: Secondary | ICD-10-CM

## 2012-12-28 DIAGNOSIS — J029 Acute pharyngitis, unspecified: Secondary | ICD-10-CM

## 2012-12-28 LAB — POCT RAPID STREP A (OFFICE): Rapid Strep A Screen: POSITIVE — AB

## 2012-12-28 MED ORDER — AMOXICILLIN 875 MG PO TABS: 875.0000 mg | ORAL_TABLET | Freq: Two times a day (BID) | ORAL | Status: AC

## 2012-12-28 NOTE — Assessment & Plan Note (Addendum)
With ETD.  Anatomy d/w pt.   Gently try to pop ears and use nasal saline.  Start amoxil, supportive tx o/w. Nontoxic. F/u prn.

## 2012-12-28 NOTE — Patient Instructions (Addendum)
If your pressure stays >140/>90 then let us know.  Use nasal saline and gently try to pop your ears.  Start the amoxil today.  Drink plenty of fluids, take tylenol as needed, and gargle with warm salt water for your throat.  This should gradually improve.  Take care.  Let us know if you have other concerns.

## 2012-12-28 NOTE — Progress Notes (Signed)
Pre-visit discussion using our clinic review tool. No additional management support is needed unless otherwise documented below in the visit note.  Sx started with R ear pain, for a few weeks.  Yesterday started with much more ST and R ear pain.  Pain with swallowing.  Can still swallow but with pain.  No FNAVD.  Some chills yesterday.  Not much cough.  No L ear pain usually.    Meds, vitals, and allergies reviewed.   ROS: See HPI.  Otherwise, noncontributory.  GEN: nad, alert and oriented HEENT: mucous membranes moist, tm w/o erythema, nasal exam w/o erythema, clear discharge noted,  OP red but w/o exudates, ETD noted.  Sinuses not ttp NECK: supple w/o LA CV: rrr.   PULM: ctab, no inc wob EXT: no edema SKIN: no acute rash  RST positive.

## 2014-03-22 LAB — LIPID PANEL
Cholesterol: 155 mg/dL (ref 0–200)
HDL: 19 mg/dL — AB (ref 35–70)
LDL Cholesterol: 106 mg/dL
Triglycerides: 146 mg/dL (ref 40–160)

## 2014-03-22 LAB — BASIC METABOLIC PANEL: Glucose: 89 mg/dL

## 2014-03-23 ENCOUNTER — Encounter: Payer: Self-pay | Admitting: Family Medicine

## 2014-03-23 ENCOUNTER — Ambulatory Visit (INDEPENDENT_AMBULATORY_CARE_PROVIDER_SITE_OTHER): Payer: PRIVATE HEALTH INSURANCE | Admitting: Family Medicine

## 2014-03-23 VITALS — BP 170/110 | HR 61 | Temp 97.9°F | Ht 68.0 in | Wt 233.0 lb

## 2014-03-23 DIAGNOSIS — R21 Rash and other nonspecific skin eruption: Secondary | ICD-10-CM

## 2014-03-23 DIAGNOSIS — I10 Essential (primary) hypertension: Secondary | ICD-10-CM

## 2014-03-23 DIAGNOSIS — N529 Male erectile dysfunction, unspecified: Secondary | ICD-10-CM | POA: Insufficient documentation

## 2014-03-23 DIAGNOSIS — E669 Obesity, unspecified: Secondary | ICD-10-CM

## 2014-03-23 LAB — BASIC METABOLIC PANEL
BUN: 12 mg/dL (ref 6–23)
CO2: 29 mEq/L (ref 19–32)
Calcium: 9.2 mg/dL (ref 8.4–10.5)
Chloride: 104 mEq/L (ref 96–112)
Creatinine, Ser: 1.06 mg/dL (ref 0.40–1.50)
GFR: 80.07 mL/min (ref >60.00)
Glucose, Bld: 98 mg/dL (ref 70–99)
Potassium: 3.8 mEq/L (ref 3.5–5.1)
Sodium: 138 mEq/L (ref 135–145)

## 2014-03-23 LAB — TSH: TSH: 1.19 u[IU]/mL (ref 0.35–4.50)

## 2014-03-23 MED ORDER — LISINOPRIL 20 MG PO TABS: 20.0000 mg | ORAL_TABLET | Freq: Every day | ORAL | Status: DC

## 2014-03-23 NOTE — Progress Notes (Signed)
BP 170/110 mmHg  Pulse 61  Temp(Src) 97.9 F (36.6 C) (Oral)  Ht  (1.727 m)  Wt 233 lb (105.688 kg)  BMI 35.44 kg/m2  SpO2 98%   CC: check blood pressure  Subjective:    Patient ID: Eric Huff, male    DOB: 10-18-1970, 44 y.o.   MRN: [id number]  HPI: Eric Huff is a 44 y.o. male presenting on 03/23/2014 for Follow-up   Elevated bp readings at work recently. At bioscreen 198/110 yesterday. Has had off and on elevated bp in the past, never significant issue. Occasional "fuzzy head feeling". No HA, vision changes, CP/tightness, SOB, leg swelling.   Prior on bp med but didn't feel well on this. Doesn't remember this.  Trying to eat healthier - nutrabullet with Kale and spinach. Decreased activity at work, but also less stress.   Quit smoking 5-6 yrs ago. No EtOH.  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  BP Readings from Last 3 Encounters:  03/23/14 170/110  12/28/12 158/100  09/22/10 150/88    Noticing decreased erections.   Noticing rash under arms axillary region bilaterally. Regularly uses old spice deodorant. Tends to be worse in summer.  Relevant past medical, surgical, family and social history reviewed and updated as indicated. Interim medical history since our last visit reviewed. Allergies and medications reviewed and updated. Current Outpatient Prescriptions on File Prior to Visit  Medication Sig  . loratadine (CLARITIN) 10 MG tablet Take 10 mg by mouth daily.  . Naproxen Sodium (ALEVE) 220 MG CAPS Take by mouth as needed.   No current facility-administered medications on file prior to visit.   Past Medical History  Diagnosis Date  . Hypertension 12-2005    Review of Systems Per HPI unless specifically indicated above     Objective:    BP 170/110 mmHg  Pulse 61  Temp(Src) 97.9 F (36.6 C) (Oral)  Ht  (1.727 m)  Wt 233 lb (105.688 kg)  BMI 35.44 kg/m2  SpO2 98%  Wt Readings from Last 3 Encounters:    03/23/14 233 lb (105.688 kg)  12/28/12 241 lb 8 oz (109.544 kg)  09/22/10 242 lb (109.77 kg)    Physical Exam  Constitutional: He appears well-developed and well-nourished. No distress.  HENT:  Mouth/Throat: Oropharynx is clear and moist. No oropharyngeal exudate.  Neck: Normal range of motion. Neck supple. No thyromegaly present.  Cardiovascular: Normal rate, regular rhythm, normal heart sounds and intact distal pulses.   No murmur heard. Pulmonary/Chest: Effort normal and breath sounds normal. No respiratory distress. He has no wheezes. He has no rales.  Musculoskeletal: He exhibits no edema.  Skin: Skin is warm and dry. Rash noted.  Erythematous rash axillary region  Psychiatric: He has a normal mood and affect.  Nursing note and vitals reviewed.       Assessment & Plan:   Problem List Items Addressed This Visit    Skin rash    ?candidal rash - discussed OTC lotrimin use. Update if more irritation or more bothersome.      Obesity    Reviewed with patient healthy diet and lifestyle changes to affect sustainable weight loss. Already has noted some weight loss with healthy changes.      Essential hypertension - Primary    Uncontrolled hypertension today - check baseline EKG again, check BMP and TSH today. Discussed healthy diet and lifestyle changes to affect improvement in bp but he already does many of these.  Provided with mediterranean diet handout. labwork today. Start lisinopril 10mg  daily for 4 days then increase to 20mg  daily. Return 10d after starting med for Cr check EKG today - NSR rate 60, slightly LAD, normal intervals, no acute ST/T changes, ?early repolarization anteriorly      Relevant Medications   lisinopril (PRINIVIL,ZESTRIL) tablet   Other Relevant Orders   EKG 12-Lead (Completed)   TSH   Basic metabolic panel   Creatinine, serum   Erectile dysfunction       Follow up plan: Return in about 4 weeks (around 04/20/2014), or as needed, for follow up  visit.

## 2014-03-23 NOTE — Patient Instructions (Signed)
EKG and blood work today. Start lisinopril 1/2 tablet daily ( ) for 4 days then increase to 1 tablet daily ( ). Sent to pharmacy. Return in 10 days for lab visit only to recheck kidney function. Return in 3-4 weeks for follow up.  Your goal blood pressure is <140/90. Work on low salt/sodium diet - goal <1.5gm (1,500mg ) per day. Eat a diet high in fruits/vegetables and whole grains.  Look into mediterranean and DASH diets. Goal activity is 195min/wk of moderate intensity exercise.  This can be split into 30 minute chunks.  If you are not at this level, you can start with smaller 10-15 min increments and slowly build up activity. - but wait until better control of blood pressure achieved Look at www.heart.org for more resources      Mediterranean Diet  Why follow it? Research shows. . Those who follow the Mediterranean diet have a reduced risk of heart disease  . The diet is associated with a reduced incidence of Parkinson's and Alzheimer's diseases . People following the diet may have longer life expectancies and lower rates of chronic diseases  . The Dietary Guidelines for Americans recommends the Mediterranean diet as an eating plan to promote health and prevent disease  What Is the Mediterranean Diet?  . Healthy eating plan based on typical foods and recipes of Mediterranean-style cooking . The diet is primarily a plant based diet; these foods should make up a majority of meals   Starches - Plant based foods should make up a majority of meals - They are an important sources of vitamins, minerals, energy, antioxidants, and fiber - Choose whole grains, foods high in fiber and minimally processed items  - Typical grain sources include wheat, oats, barley, corn, brown rice, bulgar, farro, millet, polenta, couscous  - Various types of beans include chickpeas, lentils, fava beans, black beans, white beans   Fruits  Veggies - Large quantities of antioxidant rich fruits & veggies; 6 or  more servings  - Vegetables can be eaten raw or lightly drizzled with oil and cooked  - Vegetables common to the traditional Mediterranean Diet include: artichokes, arugula, beets, broccoli, brussel sprouts, cabbage, carrots, celery, collard greens, cucumbers, eggplant, kale, leeks, lemons, lettuce, mushrooms, okra, onions, peas, peppers, potatoes, pumpkin, radishes, rutabaga, shallots, spinach, sweet potatoes, turnips, zucchini - Fruits common to the Mediterranean Diet include: apples, apricots, avocados, cherries, clementines, dates, figs, grapefruits, grapes, melons, nectarines, oranges, peaches, pears, pomegranates, strawberries, tangerines  Fats - Replace butter and margarine with healthy oils, such as olive oil, canola oil, and tahini  - Limit nuts to no more than a handful a day  - Nuts include walnuts, almonds, pecans, pistachios, pine nuts  - Limit or avoid candied, honey roasted or heavily salted nuts - Olives are central to the [employer] - can be eaten whole or used in a variety of dishes   Meats Protein - Limiting red meat: no more than a few times a month - When eating red meat: choose lean cuts and keep the portion to the size of deck of cards - Eggs: approx. 0 to 4 times a week  - Fish and lean poultry: at least 2 a week  - Healthy protein sources include, chicken, [place], lean beef, lamb - Increase intake of seafood such as tuna, salmon, trout, mackerel, shrimp, scallops - Avoid or limit high fat processed meats such as sausage and bacon  Dairy - Include moderate amounts of low fat dairy products  - Focus on healthy dairy  such as fat free yogurt, skim milk, low or reduced fat cheese - Limit dairy products higher in fat such as whole or 2% milk, cheese, ice cream  Alcohol - Moderate amounts of red wine is ok  - No more than 5 oz daily for women (all ages) and men older than age 46  - No more than 10 oz of wine daily for men younger than 3565  Other - Limit sweets and other  desserts  - Use herbs and spices instead of salt to flavor foods  - Herbs and spices common to the traditional Mediterranean Diet include: basil, bay leaves, chives, cloves, cumin, fennel, garlic, lavender, marjoram, mint, oregano, parsley, pepper, rosemary, sage, savory, sumac, tarragon, thyme   It's not just a diet, it's a lifestyle:  . The Mediterranean diet includes lifestyle factors typical of those in the region  . Foods, drinks and meals are best eaten with others and savored . Daily physical activity is important for overall good health . This could be strenuous exercise like running and aerobics . This could also be more leisurely activities such as walking, housework, yard-work, or taking the stairs . Moderation is the key; a balanced and healthy diet accommodates most foods and drinks . Consider portion sizes and frequency of consumption of certain foods   Meal Ideas & Options:  . Breakfast:  o Whole wheat toast or whole wheat English muffins with peanut butter & hard boiled egg o Steel cut oats topped with apples & cinnamon and skim milk  o Fresh fruit: banana, strawberries, melon, berries, peaches  o Smoothies: strawberries, bananas, greek yogurt, peanut butter o Low fat greek yogurt with blueberries and granola  o Egg white omelet with spinach and mushrooms o Breakfast couscous: whole wheat couscous, apricots, skim milk, cranberries  . Sandwiches:  o Hummus and grilled vegetables (peppers, zucchini, squash) on whole wheat bread   o Grilled chicken on whole wheat pita with lettuce, tomatoes, cucumbers or tzatziki  o Tuna salad on whole wheat bread: tuna salad made with greek yogurt, olives, red peppers, capers, green onions o Garlic rosemary lamb pita: lamb sauted with garlic, rosemary, salt & pepper; add lettuce, cucumber, greek yogurt to pita - flavor with lemon juice and black pepper  . Seafood:  o Mediterranean grilled salmon, seasoned with garlic, basil, parsley, lemon  juice and black pepper o Shrimp, lemon, and spinach whole-grain pasta salad made with low fat greek yogurt  o Seared scallops with lemon orzo  o Seared tuna steaks seasoned salt, pepper, coriander topped with tomato mixture of olives, tomatoes, olive oil, minced garlic, parsley, green onions and cappers  . Meats:  o Herbed greek chicken salad with kalamata olives, cucumber, feta  o Red bell peppers stuffed with spinach, bulgur, lean ground beef (or lentils) & topped with feta   o Kebabs: skewers of chicken, tomatoes, onions, zucchini, squash  o [place] burgers: made with red onions, mint, dill, lemon juice, feta cheese topped with roasted red peppers . Vegetarian o Cucumber salad: cucumbers, artichoke hearts, celery, red onion, feta cheese, tossed in olive oil & lemon juice  o Hummus and whole grain pita points with a greek salad (lettuce, tomato, feta, olives, cucumbers, red onion) o Lentil soup with celery, carrots made with vegetable broth, garlic, salt and pepper  o Tabouli salad: parsley, bulgur, mint, scallions, cucumbers, tomato, radishes, lemon juice, olive oil, salt and pepper.

## 2014-03-23 NOTE — Assessment & Plan Note (Signed)
Reviewed with patient healthy diet and lifestyle changes to affect sustainable weight loss. Already has noted some weight loss with healthy changes.

## 2014-03-23 NOTE — Assessment & Plan Note (Signed)
?  candidal rash - discussed OTC lotrimin use. Update if more irritation or more bothersome.

## 2014-03-23 NOTE — Progress Notes (Signed)
Pre visit review using our clinic review tool, if applicable. No additional management support is needed unless otherwise documented below in the visit note. 

## 2014-03-23 NOTE — Assessment & Plan Note (Addendum)
Uncontrolled hypertension today - check baseline EKG again, check BMP and TSH today. Discussed healthy diet and lifestyle changes to affect improvement in bp but he already does many of these. Provided with mediterranean diet handout. labwork today. Start lisinopril 10mg  daily for 4 days then increase to 20mg  daily. Return 10d after starting med for Cr check EKG today - NSR rate 60, slightly LAD, normal intervals, no acute ST/T changes, ?early repolarization anteriorly

## 2014-03-26 ENCOUNTER — Encounter: Payer: Self-pay | Admitting: *Deleted

## 2014-03-27 ENCOUNTER — Telehealth: Payer: Self-pay | Admitting: Family Medicine

## 2014-03-27 NOTE — Telephone Encounter (Signed)
emmi mailed  °

## 2014-04-02 ENCOUNTER — Other Ambulatory Visit (INDEPENDENT_AMBULATORY_CARE_PROVIDER_SITE_OTHER): Payer: PRIVATE HEALTH INSURANCE

## 2014-04-02 ENCOUNTER — Other Ambulatory Visit: Payer: PRIVATE HEALTH INSURANCE

## 2014-04-02 DIAGNOSIS — I10 Essential (primary) hypertension: Secondary | ICD-10-CM

## 2014-04-02 LAB — CREATININE, SERUM: Creatinine, Ser: 1.11 mg/dL (ref 0.40–1.50)

## 2014-04-04 ENCOUNTER — Encounter: Payer: Self-pay | Admitting: *Deleted

## 2014-04-13 ENCOUNTER — Encounter: Payer: Self-pay | Admitting: Family Medicine

## 2014-04-13 ENCOUNTER — Ambulatory Visit (INDEPENDENT_AMBULATORY_CARE_PROVIDER_SITE_OTHER): Payer: PRIVATE HEALTH INSURANCE | Admitting: Family Medicine

## 2014-04-13 VITALS — BP 136/84 | HR 72 | Temp 98.1°F | Wt 233.8 lb

## 2014-04-13 DIAGNOSIS — N529 Male erectile dysfunction, unspecified: Secondary | ICD-10-CM

## 2014-04-13 DIAGNOSIS — I10 Essential (primary) hypertension: Secondary | ICD-10-CM

## 2014-04-13 MED ORDER — SILDENAFIL CITRATE 20 MG PO TABS: 60.0000 mg | ORAL_TABLET | Freq: Every day | ORAL | Status: DC | PRN

## 2014-04-13 NOTE — Patient Instructions (Addendum)
Blood pressure is looking much better. Continue lisinoril 20mg  daily. May try sildenafil for relations 3-4 pills as needed. Start aspirin 81mg  three times a week. Return in 6 mo for fasting labs and after follow up visit/physical

## 2014-04-13 NOTE — Assessment & Plan Note (Addendum)
Marked improvement based on log he brings and in office BP. Tolerating lisinopril, continue 20mg  daily. Start aspirin 81mg  MWF.

## 2014-04-13 NOTE — Progress Notes (Signed)
Pre visit review using our clinic review tool, if applicable. No additional management support is needed unless otherwise documented below in the visit note. 

## 2014-04-13 NOTE — Progress Notes (Signed)
   BP 136/84 mmHg  Pulse 72  Temp(Src) 98.1 F (36.7 C) (Oral)  Wt 233 lb 12 oz (106.028 kg)   CC: 4 wk f/u visit  Subjective:    Patient ID: Eric Huff, male    DOB: 07/16/1970, 44 y.o.   MRN: [id number]  HPI: Eric ScarletJoseph W Stalling is a 44 y.o. male presenting on 04/13/2014 for Follow-up   1 month f/u visit. Last visit noted elevated bp 170-190/100-110. Started on lisinopril 20mg  daily. Also recommended mediterranean diet. Cr stable after starting lisinopril. Brings log of blood pressures showing better control over last several weeks - this week bp 120-140/80-90s. Tolerating lisinopril well. No HA, vision changes, CP/tightness, SOB, leg swelling. Staying well hydrated with water.   Lab Results  Component Value Date   CREATININE 1.11 04/02/2014    ED - wakes up with erection, but notices more trouble with maintaining erection. No chest pain or dyspnea with sex.   Relevant past medical, surgical, family and social history reviewed and updated as indicated. Interim medical history since our last visit reviewed. Allergies and medications reviewed and updated. Current Outpatient Prescriptions on File Prior to Visit  Medication Sig  . lisinopril (PRINIVIL,ZESTRIL) 20 MG tablet Take 1 tablet (20 mg total) by mouth daily. (take 1/2 tablet for first 4 days)  . loratadine (CLARITIN) 10 MG tablet Take 10 mg by mouth daily.  . Naproxen Sodium (ALEVE) 220 MG CAPS Take by mouth as needed.   No current facility-administered medications on file prior to visit.    Review of Systems Per HPI unless specifically indicated above     Objective:    BP 136/84 mmHg  Pulse 72  Temp(Src) 98.1 F (36.7 C) (Oral)  Wt 233 lb 12 oz (106.028 kg)  Wt Readings from Last 3 Encounters:  04/13/14 233 lb 12 oz (106.028 kg)  03/23/14 233 lb (105.688 kg)  12/28/12 241 lb 8 oz (109.544 kg)   Body mass index is 35.55 kg/(m^2). Physical Exam  Constitutional: He appears well-developed and well-nourished.  No distress.  HENT:  Mouth/Throat: Oropharynx is clear and moist. No oropharyngeal exudate.  Cardiovascular: Normal rate, regular rhythm, normal heart sounds and intact distal pulses.   No murmur heard. Pulmonary/Chest: Effort normal and breath sounds normal. No respiratory distress. He has no wheezes. He has no rales.  Musculoskeletal: He exhibits no edema.  Skin: Skin is warm and dry. No rash noted.  Psychiatric: He has a normal mood and affect.  Nursing note and vitals reviewed.  Results for orders placed or performed in visit on 04/02/14  Creatinine, serum  Result Value Ref Range   Creatinine, Ser 1.11 0.40 - 1.50 mg/dL      Assessment & Plan:   Problem List Items Addressed This Visit    Essential hypertension - Primary    Marked improvement based on log he brings and in office BP. Tolerating lisinopril, continue 20mg  daily. Start aspirin 81mg  MWF.      Relevant Medications   aspirin EC 81 MG tablet   sildenafil (REVATIO) 20 MG tablet   Erectile dysfunction    Discussed risks/side effects of PDE5 inhibitors including headache, priapism and avoiding nitrates. Start sildenafil 60-80mg  prn relations. Update if not effective for brand name PDE5 inhibitor.          Follow up plan: Return in about 6 months (around 10/14/2014), or if symptoms worsen or fail to improve, for annual exam, prior fasting for blood work.

## 2014-04-13 NOTE — Assessment & Plan Note (Signed)
Discussed risks/side effects of PDE5 inhibitors including headache, priapism and avoiding nitrates. Start sildenafil 60-80mg  prn relations. Update if not effective for brand name PDE5 inhibitor.

## 2014-04-16 ENCOUNTER — Telehealth: Payer: Self-pay | Admitting: *Deleted

## 2014-04-16 DIAGNOSIS — E785 Hyperlipidemia, unspecified: Secondary | ICD-10-CM

## 2014-04-16 NOTE — Telephone Encounter (Signed)
PA for Revatio in your IN box for completion.

## 2014-04-17 DIAGNOSIS — E785 Hyperlipidemia, unspecified: Secondary | ICD-10-CM | POA: Insufficient documentation

## 2014-04-17 NOTE — Telephone Encounter (Signed)
filled and in [employer] box. If not covered (I anticipate won't be) pt will need to pay out of pocket.

## 2014-04-18 NOTE — Telephone Encounter (Signed)
Form faxed. Will await determination. 

## 2014-04-25 NOTE — Telephone Encounter (Signed)
PA denied.

## 2014-10-10 ENCOUNTER — Other Ambulatory Visit: Payer: Self-pay | Admitting: Family Medicine

## 2014-10-10 DIAGNOSIS — I10 Essential (primary) hypertension: Secondary | ICD-10-CM

## 2014-10-10 DIAGNOSIS — E785 Hyperlipidemia, unspecified: Secondary | ICD-10-CM

## 2014-10-12 ENCOUNTER — Other Ambulatory Visit (INDEPENDENT_AMBULATORY_CARE_PROVIDER_SITE_OTHER): Payer: PRIVATE HEALTH INSURANCE

## 2014-10-12 DIAGNOSIS — E785 Hyperlipidemia, unspecified: Secondary | ICD-10-CM | POA: Diagnosis not present

## 2014-10-12 DIAGNOSIS — I10 Essential (primary) hypertension: Secondary | ICD-10-CM

## 2014-10-12 LAB — LIPID PANEL
Cholesterol: 176 mg/dL (ref 0–200)
HDL: 35.4 mg/dL — ABNORMAL LOW (ref >39.00)
LDL Cholesterol: 107 mg/dL — ABNORMAL HIGH (ref 0–99)
NonHDL: 140.25
Total CHOL/HDL Ratio: 5
Triglycerides: 166 mg/dL — ABNORMAL HIGH (ref 0.0–149.0)
VLDL: 33.2 mg/dL (ref 0.0–40.0)

## 2014-10-12 LAB — BASIC METABOLIC PANEL
BUN: 18 mg/dL (ref 6–23)
CO2: 27 mEq/L (ref 19–32)
Calcium: 9.2 mg/dL (ref 8.4–10.5)
Chloride: 101 mEq/L (ref 96–112)
Creatinine, Ser: 1.16 mg/dL (ref 0.40–1.50)
GFR: 71.99 mL/min (ref >60.00)
Glucose, Bld: 100 mg/dL — ABNORMAL HIGH (ref 70–99)
Potassium: 4.5 mEq/L (ref 3.5–5.1)
Sodium: 134 mEq/L — ABNORMAL LOW (ref 135–145)

## 2014-10-18 ENCOUNTER — Other Ambulatory Visit: Payer: Self-pay | Admitting: Family Medicine

## 2014-10-19 ENCOUNTER — Ambulatory Visit (INDEPENDENT_AMBULATORY_CARE_PROVIDER_SITE_OTHER): Payer: PRIVATE HEALTH INSURANCE | Admitting: Family Medicine

## 2014-10-19 ENCOUNTER — Encounter: Payer: Self-pay | Admitting: Family Medicine

## 2014-10-19 VITALS — BP 110/70 | HR 67 | Temp 98.5°F | Resp 16 | Ht 69.0 in | Wt 238.8 lb

## 2014-10-19 DIAGNOSIS — I10 Essential (primary) hypertension: Secondary | ICD-10-CM

## 2014-10-19 DIAGNOSIS — Z0000 Encounter for general adult medical examination without abnormal findings: Secondary | ICD-10-CM | POA: Diagnosis not present

## 2014-10-19 DIAGNOSIS — E785 Hyperlipidemia, unspecified: Secondary | ICD-10-CM

## 2014-10-19 DIAGNOSIS — IMO0001 Reserved for inherently not codable concepts without codable children: Secondary | ICD-10-CM

## 2014-10-19 DIAGNOSIS — R739 Hyperglycemia, unspecified: Secondary | ICD-10-CM

## 2014-10-19 DIAGNOSIS — Z0001 Encounter for general adult medical examination with abnormal findings: Secondary | ICD-10-CM | POA: Insufficient documentation

## 2014-10-19 NOTE — Assessment & Plan Note (Addendum)
Discussed healthy diet and lifestyle changes to affect sustainable weight loss. Encouraged he incorporate aerobic exercise into routine.

## 2014-10-19 NOTE — Assessment & Plan Note (Signed)
Preventative protocols reviewed and updated unless pt declined. Discussed healthy diet and lifestyle.  

## 2014-10-19 NOTE — Patient Instructions (Addendum)
You are doing well today Return as needed or in 1 year for next physical.      Mediterranean Diet  Why follow it? Research shows. . Those who follow the Mediterranean diet have a reduced risk of heart disease  . The diet is associated with a reduced incidence of Parkinson's and Alzheimer's diseases . People following the diet may have longer life expectancies and lower rates of chronic diseases  . The Dietary Guidelines for Americans recommends the Mediterranean diet as an eating plan to promote health and prevent disease  What Is the Mediterranean Diet?  . Healthy eating plan based on typical foods and recipes of Mediterranean-style cooking . The diet is primarily a plant based diet; these foods should make up a majority of meals   Starches - Plant based foods should make up a majority of meals - They are an important sources of vitamins, minerals, energy, antioxidants, and fiber - Choose whole grains, foods high in fiber and minimally processed items  - Typical grain sources include wheat, oats, barley, corn, brown rice, bulgar, farro, millet, polenta, couscous  - Various types of beans include chickpeas, lentils, fava beans, black beans, white beans   Fruits  Veggies - Large quantities of antioxidant rich fruits & veggies; 6 or more servings  - Vegetables can be eaten raw or lightly drizzled with oil and cooked  - Vegetables common to the traditional Mediterranean Diet include: artichokes, arugula, beets, broccoli, brussel sprouts, cabbage, carrots, celery, collard greens, cucumbers, eggplant, kale, leeks, lemons, lettuce, mushrooms, okra, onions, peas, peppers, potatoes, pumpkin, radishes, rutabaga, shallots, spinach, sweet potatoes, turnips, zucchini - Fruits common to the Mediterranean Diet include: apples, apricots, avocados, cherries, clementines, dates, figs, grapefruits, grapes, melons, nectarines, oranges, peaches, pears, pomegranates, strawberries, tangerines  Fats - Replace  butter and margarine with healthy oils, such as olive oil, canola oil, and tahini  - Limit nuts to no more than a handful a day  - Nuts include walnuts, almonds, pecans, pistachios, pine nuts  - Limit or avoid candied, honey roasted or heavily salted nuts - Olives are central to the Mediterranean diet - can be eaten whole or used in a variety of dishes   Meats Protein - Limiting red meat: no more than a few times a month - When eating red meat: choose lean cuts and keep the portion to the size of deck of cards - Eggs: approx. 0 to 4 times a week  - Fish and lean poultry: at least 2 a week  - Healthy protein sources include, chicken, turkey, lean beef, lamb - Increase intake of seafood such as tuna, salmon, trout, mackerel, shrimp, scallops - Avoid or limit high fat processed meats such as sausage and bacon  Dairy - Include moderate amounts of low fat dairy products  - Focus on healthy dairy such as fat free yogurt, skim milk, low or reduced fat cheese - Limit dairy products higher in fat such as whole or 2% milk, cheese, ice cream  Alcohol - Moderate amounts of red wine is ok  - No more than 5 oz daily for women (all ages) and men older than age 65  - No more than 10 oz of wine daily for men younger than 65  Other - Limit sweets and other desserts  - Use herbs and spices instead of salt to flavor foods  - Herbs and spices common to the traditional Mediterranean Diet include: basil, bay leaves, chives, cloves, cumin, fennel, garlic, lavender, marjoram, mint, oregano, parsley,   pepper, rosemary, sage, savory, sumac, tarragon, thyme   It's not just a diet, it's a lifestyle:  . The Mediterranean diet includes lifestyle factors typical of those in the region  . Foods, drinks and meals are best eaten with others and savored . Daily physical activity is important for overall good health . This could be strenuous exercise like running and aerobics . This could also be more leisurely activities such  as walking, housework, yard-work, or taking the stairs . Moderation is the key; a balanced and healthy diet accommodates most foods and drinks . Consider portion sizes and frequency of consumption of certain foods   Meal Ideas & Options:  . Breakfast:  o Whole wheat toast or whole wheat English muffins with peanut butter & hard boiled egg o Steel cut oats topped with apples & cinnamon and skim milk  o Fresh fruit: banana, strawberries, melon, berries, peaches  o Smoothies: strawberries, bananas, greek yogurt, peanut butter o Low fat greek yogurt with blueberries and granola  o Egg white omelet with spinach and mushrooms o Breakfast couscous: whole wheat couscous, apricots, skim milk, cranberries  . Sandwiches:  o Hummus and grilled vegetables (peppers, zucchini, squash) on whole wheat bread   o Grilled chicken on whole wheat pita with lettuce, tomatoes, cucumbers or tzatziki  o Tuna salad on whole wheat bread: tuna salad made with greek yogurt, olives, red peppers, capers, green onions o Garlic rosemary lamb pita: lamb sauted with garlic, rosemary, salt & pepper; add lettuce, cucumber, greek yogurt to pita - flavor with lemon juice and black pepper  . Seafood:  o Mediterranean grilled salmon, seasoned with garlic, basil, parsley, lemon juice and black pepper o Shrimp, lemon, and spinach whole-grain pasta salad made with low fat greek yogurt  o Seared scallops with lemon orzo  o Seared tuna steaks seasoned salt, pepper, coriander topped with tomato mixture of olives, tomatoes, olive oil, minced garlic, parsley, green onions and cappers  . Meats:  o Herbed greek chicken salad with kalamata olives, cucumber, feta  o Red bell peppers stuffed with spinach, bulgur, lean ground beef (or lentils) & topped with feta   o Kebabs: skewers of chicken, tomatoes, onions, zucchini, squash  o Turkey burgers: made with red onions, mint, dill, lemon juice, feta cheese topped with roasted red  peppers . Vegetarian o Cucumber salad: cucumbers, artichoke hearts, celery, red onion, feta cheese, tossed in olive oil & lemon juice  o Hummus and whole grain pita points with a greek salad (lettuce, tomato, feta, olives, cucumbers, red onion) o Lentil soup with celery, carrots made with vegetable broth, garlic, salt and pepper  o Tabouli salad: parsley, bulgur, mint, scallions, cucumbers, tomato, radishes, lemon juice, olive oil, salt and pepper.  

## 2014-10-19 NOTE — Assessment & Plan Note (Addendum)
Chronic, stable. Continue current regimen. Again encouraged mediterranean diet.

## 2014-10-19 NOTE — Progress Notes (Signed)
BP 110/70 mmHg  Pulse 67  Temp(Src) 98.5 F (36.9 C)  Resp 16  Ht  (1.753 m)  Wt 238 lb 12 oz (108.296 kg)  BMI 35.24 kg/m2  SpO2 97%   CC: CPE  Subjective:    Patient ID: Eric Huff, male    DOB: [birth year], 44 y.o.   MRN: [id number]  HPI: Eric Huff is a 44 y.o. male presenting on 10/19/2014 for CPE   HTN - compliant with lisinopril  daily. No significant aleve use. Takes aspirin daily. fmhx CAD - grandfathers with MI/CABG). Rare aleve for body aches. ED - revatio not covered by insurance. Not currently taking anything.  Preventative: Flu shot - declines  Tetanus shot - 2005. Declines today. Seat belt use discussed  Sunscreen use discussed, no changing moles on skin.  Lives with wife, youngest daughter, 2 dogs, bearded dragon Occupation: Works as a Transport planner, second shift Activity: no regular exercise Diet: good water, fruits/vegetables daily  Relevant past medical, surgical, family and social history reviewed and updated as indicated. Interim medical history since our last visit reviewed. Allergies and medications reviewed and updated. Current Outpatient Prescriptions on File Prior to Visit  Medication Sig  . aspirin EC 81 MG tablet Take 81 mg by mouth daily.   Marland Kitchen lisinopril (PRINIVIL,ZESTRIL) 20 MG tablet Take 1 tablet (20 mg total) by mouth daily.  Marland Kitchen loratadine (CLARITIN) 10 MG tablet Take 10 mg by mouth daily.  . Naproxen Sodium (ALEVE) 220 MG CAPS Take by mouth as needed.   No current facility-administered medications on file prior to visit.    Review of Systems  Constitutional: Negative for fever, chills, activity change, appetite change, fatigue and unexpected weight change.  HENT: Negative for hearing loss.   Eyes: Negative for visual disturbance.  Respiratory: Negative for cough, chest tightness, shortness of breath and wheezing.   Cardiovascular: Negative for chest pain, palpitations and leg swelling.  Gastrointestinal: Negative  for nausea, vomiting, abdominal pain, diarrhea, constipation, blood in stool and abdominal distention.  Genitourinary: Negative for hematuria and difficulty urinating.  Musculoskeletal: Negative for myalgias, arthralgias and neck pain.  Skin: Negative for rash.  Neurological: Negative for dizziness, seizures, syncope and headaches.  Hematological: Negative for adenopathy. Does not bruise/bleed easily.  Psychiatric/Behavioral: Negative for dysphoric mood. The patient is not nervous/anxious.    Per HPI unless specifically indicated above     Objective:    BP 110/70 mmHg  Pulse 67  Temp(Src) 98.5 F (36.9 C)  Resp 16  Ht  (1.753 m)  Wt 238 lb 12 oz (108.296 kg)  BMI 35.24 kg/m2  SpO2 97%  Wt Readings from Last 3 Encounters:  10/19/14 238 lb 12 oz (108.296 kg)  04/13/14 233 lb 12 oz (106.028 kg)  03/23/14 233 lb (105.688 kg)    Physical Exam  Constitutional: He is oriented to person, place, and time. He appears well-developed and well-nourished. No distress.  HENT:  Head: Normocephalic and atraumatic.  Right Ear: Hearing, tympanic membrane, external ear and ear canal normal.  Left Ear: Hearing, tympanic membrane, external ear and ear canal normal.  Nose: Nose normal.  Mouth/Throat: Uvula is midline, oropharynx is clear and moist and mucous membranes are normal. No oropharyngeal exudate, posterior oropharyngeal edema or posterior oropharyngeal erythema.  Eyes: Conjunctivae and EOM are normal. Pupils are equal, round, and reactive to light. No scleral icterus.  Neck: Normal range of motion. Neck supple. No thyromegaly present.  Cardiovascular: Normal rate, regular rhythm, normal  heart sounds and intact distal pulses.   No murmur heard. Pulses:      Radial pulses are 2+ on the right side, and 2+ on the left side.  Pulmonary/Chest: Effort normal and breath sounds normal. No respiratory distress. He has no wheezes. He has no rales.  Abdominal: Soft. Bowel sounds are normal. He  exhibits no distension and no mass. There is no tenderness. There is no rebound and no guarding.  Musculoskeletal: Normal range of motion. He exhibits no edema.  Lymphadenopathy:    He has no cervical adenopathy.  Neurological: He is alert and oriented to person, place, and time.  CN grossly intact, station and gait intact  Skin: Skin is warm and dry. No rash noted.  Psychiatric: He has a normal mood and affect. His behavior is normal. Judgment and thought content normal.  Nursing note and vitals reviewed.  Results for orders placed or performed in visit on 10/12/14  Lipid panel  Result Value Ref Range   Cholesterol 176 0 - 200 mg/dL   Triglycerides 161.0 (H) 0.0 - 149.0 mg/dL   HDL 96.04 (L) >54.09 mg/dL   VLDL 81.1 0.0 - 91.4 mg/dL   LDL Cholesterol 782 (H) 0 - 99 mg/dL   Total CHOL/HDL Ratio 5    NonHDL 140.25   Basic metabolic panel  Result Value Ref Range   Sodium 134 (L) 135 - 145 mEq/L   Potassium 4.5 3.5 - 5.1 mEq/L   Chloride 101 96 - 112 mEq/L   CO2 27 19 - 32 mEq/L   Glucose, Bld 100 (H) 70 - 99 mg/dL   BUN 18 6 - 23 mg/dL   Creatinine, Ser 9.56 0.40 - 1.50 mg/dL   Calcium 9.2 8.4 - 21.3 mg/dL   GFR 08.65 >78.46 mL/min      Assessment & Plan:   Problem List Items Addressed This Visit    Obesity, Class II, BMI 35-39.9, with comorbidity    Discussed healthy diet and lifestyle changes to affect sustainable weight loss. Encouraged he incorporate aerobic exercise into routine.      Hyperglycemia    Minimal. conitnue to monitor.      Health maintenance examination - Primary    Preventative protocols reviewed and updated unless pt declined. Discussed healthy diet and lifestyle.       Essential hypertension    Chronic, stable. Continue current regimen. Again encouraged mediterranean diet.      Dyslipidemia    Reviewed #s with patient.          Follow up plan: Return in about 1 year (around 10/19/2015), or as needed, for annual exam, prior fasting for  blood work.

## 2014-10-19 NOTE — Assessment & Plan Note (Signed)
Reviewed #s with patient. 

## 2014-10-19 NOTE — Assessment & Plan Note (Signed)
Minimal. conitnue to monitor.

## 2015-10-17 ENCOUNTER — Encounter: Payer: PRIVATE HEALTH INSURANCE | Admitting: Family Medicine

## 2015-10-19 ENCOUNTER — Other Ambulatory Visit: Payer: Self-pay | Admitting: Family Medicine

## 2015-10-30 ENCOUNTER — Ambulatory Visit (INDEPENDENT_AMBULATORY_CARE_PROVIDER_SITE_OTHER): Payer: PRIVATE HEALTH INSURANCE | Admitting: Family Medicine

## 2015-10-30 ENCOUNTER — Encounter: Payer: Self-pay | Admitting: Family Medicine

## 2015-10-30 VITALS — BP 128/84 | HR 64 | Temp 98.1°F | Ht 69.0 in | Wt 231.5 lb

## 2015-10-30 DIAGNOSIS — Z0000 Encounter for general adult medical examination without abnormal findings: Secondary | ICD-10-CM | POA: Diagnosis not present

## 2015-10-30 DIAGNOSIS — R739 Hyperglycemia, unspecified: Secondary | ICD-10-CM

## 2015-10-30 DIAGNOSIS — E669 Obesity, unspecified: Secondary | ICD-10-CM

## 2015-10-30 DIAGNOSIS — I10 Essential (primary) hypertension: Secondary | ICD-10-CM | POA: Diagnosis not present

## 2015-10-30 DIAGNOSIS — IMO0001 Reserved for inherently not codable concepts without codable children: Secondary | ICD-10-CM

## 2015-10-30 DIAGNOSIS — E785 Hyperlipidemia, unspecified: Secondary | ICD-10-CM

## 2015-10-30 MED ORDER — LISINOPRIL 20 MG PO TABS: 20.0000 mg | ORAL_TABLET | Freq: Every day | ORAL | 3 refills | Status: DC

## 2015-10-30 NOTE — Assessment & Plan Note (Signed)
Discussed healthy diet and lifestyle changes to affect sustainable weight loss. Pt has already implemented healthy diet changes (smaller portions, healthy choices).

## 2015-10-30 NOTE — Assessment & Plan Note (Signed)
Preventative protocols reviewed and updated unless pt declined. Discussed healthy diet and lifestyle.  

## 2015-10-30 NOTE — Patient Instructions (Addendum)
You are doing well today Return as needed or in 1 year for next physical.  Health Maintenance, Male A healthy lifestyle and preventative care can promote health and wellness.  Maintain regular health, dental, and eye exams.  Eat a healthy diet. Foods like vegetables, fruits, whole grains, low-fat dairy products, and lean protein foods contain the nutrients you need and are low in calories. Decrease your intake of foods high in solid fats, added sugars, and salt. Get information about a proper diet from your health care provider, if necessary.  Regular physical exercise is one of the most important things you can do for your health. Most adults should get at least 150 minutes of moderate-intensity exercise (any activity that increases your heart rate and causes you to sweat) each week. In addition, most adults need muscle-strengthening exercises on 2 or more days a week.   Maintain a healthy weight. The body mass index (BMI) is a screening tool to identify possible weight problems. It provides an estimate of body fat based on height and weight. Your health care provider can find your BMI and can help you achieve or maintain a healthy weight. For males 20 years and older:  A BMI below 18.5 is considered underweight.  A BMI of 18.5 to 24.9 is normal.  A BMI of 25 to 29.9 is considered overweight.  A BMI of 30 and above is considered obese.  Maintain normal blood lipids and cholesterol by exercising and minimizing your intake of saturated fat. Eat a balanced diet with plenty of fruits and vegetables. Blood tests for lipids and cholesterol should begin at age 20 and be repeated every 5 years. If your lipid or cholesterol levels are high, you are over age 50, or you are at high risk for heart disease, you may need your cholesterol levels checked more frequently.Ongoing high lipid and cholesterol levels should be treated with medicines if diet and exercise are not working.  If you smoke, find out  from your health care provider how to quit. If you do not use tobacco, do not start.  Lung cancer screening is recommended for adults aged 55-80 years who are at high risk for developing lung cancer because of a history of smoking. A yearly low-dose CT scan of the lungs is recommended for people who have at least a 30-pack-year history of smoking and are current smokers or have quit within the past 15 years. A pack year of smoking is smoking an average of 1 pack of cigarettes a day for 1 year (for example, a 30-pack-year history of smoking could mean smoking 1 pack a day for 30 years or 2 packs a day for 15 years). Yearly screening should continue until the smoker has stopped smoking for at least 15 years. Yearly screening should be stopped for people who develop a health problem that would prevent them from having lung cancer treatment.  If you choose to drink alcohol, do not have more than 2 drinks per day. One drink is considered to be 12 oz (360 mL) of beer, 5 oz (150 mL) of wine, or 1.5 oz (45 mL) of liquor.  Avoid the use of street drugs. Do not share needles with anyone. Ask for help if you need support or instructions about stopping the use of drugs.  High blood pressure causes heart disease and increases the risk of stroke. High blood pressure is more likely to develop in:  People who have blood pressure in the end of the normal range (100-139/85-89   mm Hg).  People who are overweight or obese.  People who are African American.  If you are 18-39 years of age, have your blood pressure checked every 3-5 years. If you are 40 years of age or older, have your blood pressure checked every year. You should have your blood pressure measured twice--once when you are at a hospital or clinic, and once when you are not at a hospital or clinic. Record the average of the two measurements. To check your blood pressure when you are not at a hospital or clinic, you can use:  An automated blood pressure  machine at a pharmacy.  A home blood pressure monitor.  If you are 45-79 years old, ask your health care provider if you should take aspirin to prevent heart disease.  Diabetes screening involves taking a blood sample to check your fasting blood sugar level. This should be done once every 3 years after age 45 if you are at a normal weight and without risk factors for diabetes. Testing should be considered at a younger age or be carried out more frequently if you are overweight and have at least 1 risk factor for diabetes.  Colorectal cancer can be detected and often prevented. Most routine colorectal cancer screening begins at the age of 50 and continues through age 75. However, your health care provider may recommend screening at an earlier age if you have risk factors for colon cancer. On a yearly basis, your health care provider may provide home test kits to check for hidden blood in the stool. A small camera at the end of a tube may be used to directly examine the colon (sigmoidoscopy or colonoscopy) to detect the earliest forms of colorectal cancer. Talk to your health care provider about this at age 50 when routine screening begins. A direct exam of the colon should be repeated every 5-10 years through age 75, unless early forms of precancerous polyps or small growths are found.  People who are at an increased risk for hepatitis B should be screened for this virus. You are considered at high risk for hepatitis B if:  You were born in a country where hepatitis B occurs often. Talk with your health care provider about which countries are considered high risk.  Your parents were born in a high-risk country and you have not received a shot to protect against hepatitis B (hepatitis B vaccine).  You have HIV or AIDS.  You use needles to inject street drugs.  You live with, or have sex with, someone who has hepatitis B.  You are a man who has sex with other men (MSM).  You get hemodialysis  treatment.  You take certain medicines for conditions like cancer, organ transplantation, and autoimmune conditions.  Hepatitis C blood testing is recommended for all people born from 1945 through 1965 and any individual with known risk factors for hepatitis C.  Healthy men should no longer receive prostate-specific antigen (PSA) blood tests as part of routine cancer screening. Talk to your health care provider about prostate cancer screening.  Testicular cancer screening is not recommended for adolescents or adult males who have no symptoms. Screening includes self-exam, a health care provider exam, and other screening tests. Consult with your health care provider about any symptoms you have or any concerns you have about testicular cancer.  Practice safe sex. Use condoms and avoid high-risk sexual practices to reduce the spread of sexually transmitted infections (STIs).  You should be screened for STIs, including gonorrhea   and chlamydia if:  You are sexually active and are younger than 24 years.  You are older than 24 years, and your health care provider tells you that you are at risk for this type of infection.  Your sexual activity has changed since you were last screened, and you are at an increased risk for chlamydia or gonorrhea. Ask your health care provider if you are at risk.  If you are at risk of being infected with HIV, it is recommended that you take a prescription medicine daily to prevent HIV infection. This is called pre-exposure prophylaxis (PrEP). You are considered at risk if:  You are a man who has sex with other men (MSM).  You are a heterosexual man who is sexually active with multiple partners.  You take drugs by injection.  You are sexually active with a partner who has HIV.  Talk with your health care provider about whether you are at high risk of being infected with HIV. If you choose to begin PrEP, you should first be tested for HIV. You should then be tested  every 3 months for as long as you are taking PrEP.  Use sunscreen. Apply sunscreen liberally and repeatedly throughout the day. You should seek shade when your shadow is shorter than you. Protect yourself by wearing long sleeves, pants, a wide-brimmed hat, and sunglasses year round whenever you are outdoors.  Tell your health care provider of new moles or changes in moles, especially if there is a change in shape or color. Also, tell your health care provider if a mole is larger than the size of a pencil eraser.  A one-time screening for abdominal aortic aneurysm (AAA) and surgical repair of large AAAs by ultrasound is recommended for men aged 65-75 years who are current or former smokers.  Stay current with your vaccines (immunizations).   This information is not intended to replace advice given to you by your health care provider. Make sure you discuss any questions you have with your health care provider.   Document Released: 07/11/2007 Document Revised: 02/02/2014 Document Reviewed: 06/09/2010 Elsevier Interactive Patient Education 2016 [employer]  

## 2015-10-30 NOTE — Assessment & Plan Note (Signed)
Update FLP when returns fasting.  

## 2015-10-30 NOTE — Progress Notes (Signed)
Pre visit review using our clinic review tool, if applicable. No additional management support is needed unless otherwise documented below in the visit note. 

## 2015-10-30 NOTE — Progress Notes (Signed)
BP 128/84   Pulse 64   Temp 98.1 F (36.7 C) (Oral)   Ht 5\' 9"  (1.753 m)   Wt 231 lb 8 oz (105 kg)   BMI 34.19 kg/m    CC: CPE Subjective:    Patient ID: Eric Huff, male    DOB: 1970-09-30, 45 y.o.   MRN: [id number]  HPI: Eric Huff is a 45 y.o. male presenting on 10/30/2015 for Annual Exam   HTN - Compliant with current antihypertensive regimen of lisinopril 20mg  daily. Does not check blood pressures at home but has cuff. No low blood pressure readings or symptoms of dizziness/syncope.  Denies HA, vision changes, CP/tightness, SOB, leg swelling.   Preventative: Flu shot - declines  Tetanus shot - 2005. Declines today. Seat belt use discussed  Sunscreen use discussed, no changing moles on skin. Ex smoker Alcohol - none  Lives with wife, youngest daughter, 2 dogs, bearded dragon Occupation: Works as a Transport planner, second shift Activity: no regular exercise, some yardwork  Diet: good water, fruits/vegetables daily, smaller portions more frequency  Relevant past medical, surgical, family and social history reviewed and updated as indicated. Interim medical history since our last visit reviewed. Allergies and medications reviewed and updated. Current Outpatient Prescriptions on File Prior to Visit  Medication Sig  . aspirin EC 81 MG tablet Take 81 mg by mouth daily.   . Naproxen Sodium (ALEVE) 220 MG CAPS Take by mouth as needed.  . loratadine (CLARITIN) 10 MG tablet Take 10 mg by mouth daily.   No current facility-administered medications on file prior to visit.     Review of Systems  Constitutional: Negative for activity change, appetite change, chills, fatigue, fever and unexpected weight change.  HENT: Negative for hearing loss.   Eyes: Negative for visual disturbance.  Respiratory: Negative for cough, chest tightness, shortness of breath and wheezing.   Cardiovascular: Negative for chest pain, palpitations and leg swelling.  Gastrointestinal:  Negative for abdominal distention, abdominal pain, blood in stool, constipation, diarrhea, nausea and vomiting.  Genitourinary: Negative for difficulty urinating and hematuria.  Musculoskeletal: Negative for arthralgias, myalgias and neck pain.  Skin: Negative for rash.  Neurological: Negative for dizziness, seizures, syncope and headaches.  Hematological: Negative for adenopathy. Does not bruise/bleed easily.  Psychiatric/Behavioral: Negative for dysphoric mood. The patient is not nervous/anxious.    Per HPI unless specifically indicated in ROS section     Objective:    BP 128/84   Pulse 64   Temp 98.1 F (36.7 C) (Oral)   Ht 5\' 9"  (1.753 m)   Wt 231 lb 8 oz (105 kg)   BMI 34.19 kg/m   Wt Readings from Last 3 Encounters:  10/30/15 231 lb 8 oz (105 kg)  10/19/14 238 lb 12 oz (108.3 kg)  04/13/14 233 lb 12 oz (106 kg)    Physical Exam  Constitutional: He is oriented to person, place, and time. He appears well-developed and well-nourished. No distress.  HENT:  Head: Normocephalic and atraumatic.  Right Ear: Hearing, tympanic membrane, external ear and ear canal normal.  Left Ear: Hearing, tympanic membrane, external ear and ear canal normal.  Nose: Nose normal.  Mouth/Throat: Uvula is midline, oropharynx is clear and moist and mucous membranes are normal. No oropharyngeal exudate, posterior oropharyngeal edema or posterior oropharyngeal erythema.  Eyes: Conjunctivae and EOM are normal. Pupils are equal, round, and reactive to light. No scleral icterus.  Neck: Normal range of motion. Neck supple. No thyromegaly present.  Cardiovascular:  Normal rate, regular rhythm, normal heart sounds and intact distal pulses.   No murmur heard. Pulses:      Radial pulses are 2+ on the right side, and 2+ on the left side.  Pulmonary/Chest: Effort normal and breath sounds normal. No respiratory distress. He has no wheezes. He has no rales.  Abdominal: Soft. Bowel sounds are normal. He exhibits no  distension and no mass. There is no tenderness. There is no rebound and no guarding.  Musculoskeletal: Normal range of motion. He exhibits no edema.  Lymphadenopathy:    He has no cervical adenopathy.  Neurological: He is alert and oriented to person, place, and time.  CN grossly intact, station and gait intact  Skin: Skin is warm and dry. No rash noted.  Psychiatric: He has a normal mood and affect. His behavior is normal. Judgment and thought content normal.  Nursing note and vitals reviewed.  Results for orders placed or performed in visit on 10/12/14  Lipid panel  Result Value Ref Range   Cholesterol 176 0 - 200 mg/dL   Triglycerides 409.8166.0 (H) 0.0 - 149.0 mg/dL   HDL 11.9135.40 (L) >47.82>39.00 mg/dL   VLDL 95.633.2 0.0 - 21.340.0 mg/dL   LDL Cholesterol 086107 (H) 0 - 99 mg/dL   Total CHOL/HDL Ratio 5    NonHDL 140.25   Basic metabolic panel  Result Value Ref Range   Sodium 134 (L) 135 - 145 mEq/L   Potassium 4.5 3.5 - 5.1 mEq/L   Chloride 101 96 - 112 mEq/L   CO2 27 19 - 32 mEq/L   Glucose, Bld 100 (H) 70 - 99 mg/dL   BUN 18 6 - 23 mg/dL   Creatinine, Ser 5.781.16 0.40 - 1.50 mg/dL   Calcium 9.2 8.4 - 46.910.5 mg/dL   GFR 62.9571.99 >28.41>60.00 mL/min      Assessment & Plan:   Problem List Items Addressed This Visit    Dyslipidemia    Update FLP when returns fasting.       Relevant Orders   Lipid panel   Comprehensive metabolic panel   Essential hypertension    Chronic, stable. Continue current regimen.       Relevant Medications   lisinopril (PRINIVIL,ZESTRIL) 20 MG tablet   Other Relevant Orders   Comprehensive metabolic panel   Health maintenance examination - Primary    Preventative protocols reviewed and updated unless pt declined. Discussed healthy diet and lifestyle.       Hyperglycemia    Mild. Check cbg next fasting labs.       Obesity, Class II, BMI 35-39.9, with comorbidity    Discussed healthy diet and lifestyle changes to affect sustainable weight loss. Pt has already  implemented healthy diet changes (smaller portions, healthy choices).        Other Visit Diagnoses   None.      Follow up plan: Return in about 1 year (around 10/29/2016) for annual exam, prior fasting for blood work.  Eric BoydenJavier Valeda Corzine, MD

## 2015-10-30 NOTE — Assessment & Plan Note (Signed)
Mild. Check cbg next fasting labs.

## 2015-10-30 NOTE — Assessment & Plan Note (Signed)
Chronic, stable. Continue current regimen. 

## 2015-10-31 ENCOUNTER — Other Ambulatory Visit (INDEPENDENT_AMBULATORY_CARE_PROVIDER_SITE_OTHER): Payer: PRIVATE HEALTH INSURANCE

## 2015-10-31 ENCOUNTER — Other Ambulatory Visit: Payer: PRIVATE HEALTH INSURANCE

## 2015-10-31 DIAGNOSIS — I10 Essential (primary) hypertension: Secondary | ICD-10-CM

## 2015-10-31 DIAGNOSIS — E785 Hyperlipidemia, unspecified: Secondary | ICD-10-CM

## 2015-10-31 LAB — COMPREHENSIVE METABOLIC PANEL
ALT: 26 U/L (ref 0–53)
AST: 21 U/L (ref 0–37)
Albumin: 4.1 g/dL (ref 3.5–5.2)
Alkaline Phosphatase: 69 U/L (ref 39–117)
BUN: 14 mg/dL (ref 6–23)
CO2: 28 mEq/L (ref 19–32)
Calcium: 8.9 mg/dL (ref 8.4–10.5)
Chloride: 104 mEq/L (ref 96–112)
Creatinine, Ser: 1.07 mg/dL (ref 0.40–1.50)
GFR: 78.66 mL/min (ref >60.00)
Glucose, Bld: 103 mg/dL — ABNORMAL HIGH (ref 70–99)
Potassium: 4.5 mEq/L (ref 3.5–5.1)
Sodium: 138 mEq/L (ref 135–145)
Total Bilirubin: 0.6 mg/dL (ref 0.2–1.2)
Total Protein: 7.2 g/dL (ref 6.0–8.3)

## 2015-10-31 LAB — LIPID PANEL
Cholesterol: 180 mg/dL (ref 0–200)
HDL: 36.2 mg/dL — ABNORMAL LOW (ref >39.00)
LDL Cholesterol: 119 mg/dL — ABNORMAL HIGH (ref 0–99)
NonHDL: 144.14
Total CHOL/HDL Ratio: 5
Triglycerides: 127 mg/dL (ref 0.0–149.0)
VLDL: 25.4 mg/dL (ref 0.0–40.0)

## 2016-10-28 ENCOUNTER — Other Ambulatory Visit: Payer: Self-pay | Admitting: Family Medicine

## 2016-10-28 DIAGNOSIS — E785 Hyperlipidemia, unspecified: Secondary | ICD-10-CM

## 2016-10-29 ENCOUNTER — Other Ambulatory Visit (INDEPENDENT_AMBULATORY_CARE_PROVIDER_SITE_OTHER): Payer: PRIVATE HEALTH INSURANCE

## 2016-10-29 DIAGNOSIS — E785 Hyperlipidemia, unspecified: Secondary | ICD-10-CM | POA: Diagnosis not present

## 2016-10-29 LAB — BASIC METABOLIC PANEL
BUN: 18 mg/dL (ref 6–23)
CO2: 25 mEq/L (ref 19–32)
Calcium: 9 mg/dL (ref 8.4–10.5)
Chloride: 104 mEq/L (ref 96–112)
Creatinine, Ser: 1.04 mg/dL (ref 0.40–1.50)
GFR: 80.94 mL/min (ref >60.00)
Glucose, Bld: 100 mg/dL — ABNORMAL HIGH (ref 70–99)
Potassium: 4.4 mEq/L (ref 3.5–5.1)
Sodium: 135 mEq/L (ref 135–145)

## 2016-10-29 LAB — LIPID PANEL
Cholesterol: 201 mg/dL — ABNORMAL HIGH (ref 0–200)
HDL: 33.1 mg/dL — ABNORMAL LOW (ref >39.00)
LDL Cholesterol: 133 mg/dL — ABNORMAL HIGH (ref 0–99)
NonHDL: 167.93
Total CHOL/HDL Ratio: 6
Triglycerides: 177 mg/dL — ABNORMAL HIGH (ref 0.0–149.0)
VLDL: 35.4 mg/dL (ref 0.0–40.0)

## 2016-10-29 LAB — TSH: TSH: 1.2 u[IU]/mL (ref 0.35–4.50)

## 2016-11-02 ENCOUNTER — Encounter: Payer: Self-pay | Admitting: Family Medicine

## 2016-11-02 ENCOUNTER — Ambulatory Visit (INDEPENDENT_AMBULATORY_CARE_PROVIDER_SITE_OTHER): Payer: PRIVATE HEALTH INSURANCE | Admitting: Family Medicine

## 2016-11-02 VITALS — BP 134/80 | HR 63 | Temp 98.1°F | Ht 69.0 in | Wt 258.2 lb

## 2016-11-02 DIAGNOSIS — N529 Male erectile dysfunction, unspecified: Secondary | ICD-10-CM

## 2016-11-02 DIAGNOSIS — E785 Hyperlipidemia, unspecified: Secondary | ICD-10-CM

## 2016-11-02 DIAGNOSIS — I10 Essential (primary) hypertension: Secondary | ICD-10-CM | POA: Diagnosis not present

## 2016-11-02 DIAGNOSIS — K219 Gastro-esophageal reflux disease without esophagitis: Secondary | ICD-10-CM

## 2016-11-02 DIAGNOSIS — Z0000 Encounter for general adult medical examination without abnormal findings: Secondary | ICD-10-CM

## 2016-11-02 DIAGNOSIS — E349 Endocrine disorder, unspecified: Secondary | ICD-10-CM | POA: Insufficient documentation

## 2016-11-02 DIAGNOSIS — R6882 Decreased libido: Secondary | ICD-10-CM

## 2016-11-02 MED ORDER — LISINOPRIL 20 MG PO TABS: 20.0000 mg | ORAL_TABLET | Freq: Every day | ORAL | 3 refills | Status: DC

## 2016-11-02 NOTE — Assessment & Plan Note (Signed)
Preventative protocols reviewed and updated unless pt declined. Discussed healthy diet and lifestyle.  

## 2016-11-02 NOTE — Progress Notes (Signed)
BP 134/80 (BP Location: Left Arm, Patient Position: Sitting, Cuff Size: Large)   Pulse 63   Temp 98.1 F (36.7 C) (Oral)   Ht  (1.753 m)   Wt 258 lb 4 oz (117.1 kg)   SpO2 97%   BMI 38.14 kg/m    CC: CVPE Subjective:    Patient ID: Eric Huff, male    DOB: Nov 22, 1970, 46 y.o.   MRN: [id number]  HPI: Eric Huff is a 46 y.o. male presenting on 11/02/2016 for Annual Exam   Weight gain noted over the last year. No change in diet - avoids carbs. Has started backing down on sodas. No regular exercise regimen.  Quit chewing tobacco 6 months ago.  Decreased sex drive. Some trouble with energy/motivation. Found decrease in muscle strength. Mood ok - denies depression.  Ongoing ED.   Preventative: Flu shot - declines  Tetanus shot - 2005. Declines today.  Seat belt use discussed  Sunscreen use discussed, no changing moles on skin.  Ex smoker, quit chewing as well.  Alcohol - none  Lives with wife, youngest daughter, 2 dogs, bearded dragon Occupation: Works as a Transport planner, second shift Activity: no regular exercise, some yardwork  Diet: good water, fruits/vegetables daily, backing off sweetened beverages  Relevant past medical, surgical, family and social history reviewed and updated as indicated. Interim medical history since our last visit reviewed. Allergies and medications reviewed and updated. Outpatient Medications Prior to Visit  Medication Sig Dispense Refill  . aspirin EC 81 MG tablet Take 81 mg by mouth daily.     Marland Kitchen loratadine (CLARITIN) 10 MG tablet Take 10 mg by mouth daily.    . Naproxen Sodium (ALEVE) 220 MG CAPS Take by mouth as needed.    Marland Kitchen lisinopril (PRINIVIL,ZESTRIL) 20 MG tablet Take 1 tablet (20 mg total) by mouth daily. 90 tablet 3   No facility-administered medications prior to visit.      Per HPI unless specifically indicated in ROS section below Review of Systems  Constitutional: Negative for activity change, appetite change,  chills, fatigue, fever and unexpected weight change.  HENT: Negative for hearing loss.   Eyes: Negative for visual disturbance.  Respiratory: Negative for cough, chest tightness, shortness of breath and wheezing.   Cardiovascular: Negative for chest pain, palpitations and leg swelling.  Gastrointestinal: Negative for abdominal distention, abdominal pain, blood in stool, constipation, diarrhea, nausea and vomiting.  Genitourinary: Negative for difficulty urinating and hematuria.  Musculoskeletal: Negative for arthralgias, myalgias and neck pain.  Skin: Negative for rash.  Neurological: Negative for dizziness, seizures, syncope and headaches.  Hematological: Negative for adenopathy. Does not bruise/bleed easily.  Psychiatric/Behavioral: Negative for dysphoric mood. The patient is not nervous/anxious.        Objective:    BP 134/80 (BP Location: Left Arm, Patient Position: Sitting, Cuff Size: Large)   Pulse 63   Temp 98.1 F (36.7 C) (Oral)   Ht  (1.753 m)   Wt 258 lb 4 oz (117.1 kg)   SpO2 97%   BMI 38.14 kg/m   Wt Readings from Last 3 Encounters:  11/02/16 258 lb 4 oz (117.1 kg)  10/30/15 231 lb 8 oz (105 kg)  10/19/14 238 lb 12 oz (108.3 kg)    Physical Exam  Constitutional: He is oriented to person, place, and time. He appears well-developed and well-nourished. No distress.  HENT:  Head: Normocephalic and atraumatic.  Right Ear: Hearing, tympanic membrane, external ear and ear canal normal.  Left Ear: Hearing, tympanic membrane, external ear and ear canal normal.  Nose: Nose normal.  Mouth/Throat: Uvula is midline, oropharynx is clear and moist and mucous membranes are normal. No oropharyngeal exudate, posterior oropharyngeal edema or posterior oropharyngeal erythema.  Eyes: Pupils are equal, round, and reactive to light. Conjunctivae and EOM are normal. No scleral icterus.  Neck: Normal range of motion. Neck supple. No thyromegaly present.  Cardiovascular: Normal rate,  regular rhythm, normal heart sounds and intact distal pulses.   No murmur heard. Pulses:      Radial pulses are 2+ on the right side, and 2+ on the left side.  Pulmonary/Chest: Effort normal and breath sounds normal. No respiratory distress. He has no wheezes. He has no rales.  Abdominal: Soft. Bowel sounds are normal. He exhibits no distension and no mass. There is no tenderness. There is no rebound and no guarding.  Musculoskeletal: Normal range of motion. He exhibits no edema.  Lymphadenopathy:    He has no cervical adenopathy.  Neurological: He is alert and oriented to person, place, and time.  CN grossly intact, station and gait intact  Skin: Skin is warm and dry. No rash noted.  Psychiatric: He has a normal mood and affect. His behavior is normal. Judgment and thought content normal.  Nursing note and vitals reviewed.  Results for orders placed or performed in visit on 10/29/16  Lipid panel  Result Value Ref Range   Cholesterol 201 (H) 0 - 200 mg/dL   Triglycerides 161.0 (H) 0.0 - 149.0 mg/dL   HDL 96.04 (L) >54.09 mg/dL   VLDL 81.1 0.0 - 91.4 mg/dL   LDL Cholesterol 782 (H) 0 - 99 mg/dL   Total CHOL/HDL Ratio 6    NonHDL 167.93   TSH  Result Value Ref Range   TSH 1.20 0.35 - 4.50 uIU/mL  Basic metabolic panel  Result Value Ref Range   Sodium 135 135 - 145 mEq/L   Potassium 4.4 3.5 - 5.1 mEq/L   Chloride 104 96 - 112 mEq/L   CO2 25 19 - 32 mEq/L   Glucose, Bld 100 (H) 70 - 99 mg/dL   BUN 18 6 - 23 mg/dL   Creatinine, Ser 9.56 0.40 - 1.50 mg/dL   Calcium 9.0 8.4 - 21.3 mg/dL   GFR 08.65 >78.46 mL/min      Assessment & Plan:   Problem List Items Addressed This Visit    Decreased libido    Requests testosterone check. Discussed diagnosis and management. Return for 8am testosterone, if low will do confirmatory testing.      Relevant Orders   Testosterone   Dyslipidemia    Chronic, deteriorated. Not on medication.  The 10-year ASCVD risk score Denman George DC Montez Hageman., et  al., 2013) is: 5.7%   Values used to calculate the score:     Age: 5 years     Sex: Male     Is Non-Hispanic African American: No     Diabetic: No     Tobacco smoker: No     Systolic Blood Pressure: 134 mmHg     Is BP treated: Yes     HDL Cholesterol: 33.1 mg/dL     Total Cholesterol: 201 mg/dL       Erectile dysfunction    Ongoing trouble.       Essential hypertension    Chronic, stable. Continue current regimen.       Relevant Medications   lisinopril (PRINIVIL,ZESTRIL) 20 MG tablet   GERD  Worsening with weight gain. Managed currently with PRN tums.      Health maintenance examination - Primary    Preventative protocols reviewed and updated unless pt declined. Discussed healthy diet and lifestyle.       Severe obesity (BMI 35.0-39.9) with comorbidity (HCC)    Chronic, reviewed 20+ lb weight gain over the past year. Reviewed healthy diet and lifestyle changes to affect sustainable weight loss.           Follow up plan: Return in about 1 year (around 11/02/2017) for annual exam, prior fasting for blood work.  Eustaquio Boyden, MD

## 2016-11-02 NOTE — Assessment & Plan Note (Signed)
Worsening with weight gain. Managed currently with PRN tums.

## 2016-11-02 NOTE — Assessment & Plan Note (Signed)
Chronic, deteriorated. Not on medication.  The 10-year ASCVD risk score Denman George DC Montez Hageman., et al., 2013) is: 5.7%   Values used to calculate the score:     Age: 46 years     Sex: Male     Is Non-Hispanic African American: No     Diabetic: No     Tobacco smoker: No     Systolic Blood Pressure: 134 mmHg     Is BP treated: Yes     HDL Cholesterol: 33.1 mg/dL     Total Cholesterol: 201 mg/dL

## 2016-11-02 NOTE — Assessment & Plan Note (Signed)
Chronic, reviewed 20+ lb weight gain over the past year. Reviewed healthy diet and lifestyle changes to affect sustainable weight loss.

## 2016-11-02 NOTE — Assessment & Plan Note (Signed)
Requests testosterone check. Discussed diagnosis and management. Return for 8am testosterone, if low will do confirmatory testing.

## 2016-11-02 NOTE — Assessment & Plan Note (Signed)
Chronic, stable. Continue current regimen. 

## 2016-11-02 NOTE — Patient Instructions (Addendum)
Return one morning for am testosterone check (lab visit only).  Work on [employer] changes. Back off of sweetened beverages. Work on incorporating exercise into routine (start walking regularly).  Return as needed or in 1 year for next physical.  Health Maintenance, Male A healthy lifestyle and preventive care is important for your health and wellness. Ask your health care provider about what schedule of regular examinations is right for you. What should I know about weight and diet? Eat a Healthy Diet  Eat plenty of vegetables, fruits, whole grains, low-fat dairy products, and lean protein.  Do not eat a lot of foods high in solid fats, added sugars, or salt.  Maintain a Healthy Weight Regular exercise can help you achieve or maintain a healthy weight. You should:  Do at least 150 minutes of exercise each week. The exercise should increase your heart rate and make you sweat (moderate-intensity exercise).  Do strength-training exercises at least twice a week.  Watch Your Levels of Cholesterol and Blood Lipids  Have your blood tested for lipids and cholesterol every 5 years starting at 48 years of age. If you are at high risk for heart disease, you should start having your blood tested when you are 48 years old. You may need to have your cholesterol levels checked more often if: ? Your lipid or cholesterol levels are high. ? You are older than 48 years of age. ? You are at high risk for heart disease.  What should I know about cancer screening? Many types of cancers can be detected early and may often be prevented. Lung Cancer  You should be screened every year for lung cancer if: ? You are a current smoker who has smoked for at least 30 years. ? You are a former smoker who has quit within the past 15 years.  Talk to your health care provider about your screening options, when you should start screening, and how often you should be screened.  Colorectal Cancer  Routine  colorectal cancer screening usually begins at 48 years of age and should be repeated every 5-10 years until you are 48 years old. You may need to be screened more often if early forms of precancerous polyps or small growths are found. Your health care provider may recommend screening at an earlier age if you have risk factors for colon cancer.  Your health care provider may recommend using home test kits to check for hidden blood in the stool.  A small camera at the end of a tube can be used to examine your colon (sigmoidoscopy or colonoscopy). This checks for the earliest forms of colorectal cancer.  Prostate and Testicular Cancer  Depending on your age and overall health, your health care provider may do certain tests to screen for prostate and testicular cancer.  Talk to your health care provider about any symptoms or concerns you have about testicular or prostate cancer.  Skin Cancer  Check your skin from head to toe regularly.  Tell your health care provider about any new moles or changes in moles, especially if: ? There is a change in a mole's size, shape, or color. ? You have a mole that is larger than a pencil eraser.  Always use sunscreen. Apply sunscreen liberally and repeat throughout the day.  Protect yourself by wearing long sleeves, pants, a wide-brimmed hat, and sunglasses when outside.  What should I know about heart disease, diabetes, and high blood pressure?  If you are 44-31 years of age, have  your blood pressure checked every 3-5 years. If you are 20 years of age or older, have your blood pressure checked every year. You should have your blood pressure measured twice-once when you are at a hospital or clinic, and once when you are not at a hospital or clinic. Record the average of the two measurements. To check your blood pressure when you are not at a hospital or clinic, you can use: ? An automated blood pressure machine at a pharmacy. ? A home blood pressure  monitor.  Talk to your health care provider about your target blood pressure.  If you are between 4-39 years old, ask your health care provider if you should take aspirin to prevent heart disease.  Have regular diabetes screenings by checking your fasting blood sugar level. ? If you are at a normal weight and have a low risk for diabetes, have this test once every three years after the age of 63. ? If you are overweight and have a high risk for diabetes, consider being tested at a younger age or more often.  A one-time screening for abdominal aortic aneurysm (AAA) by ultrasound is recommended for men aged 54-75 years who are current or former smokers. What should I know about preventing infection? Hepatitis B If you have a higher risk for hepatitis B, you should be screened for this virus. Talk with your health care provider to find out if you are at risk for hepatitis B infection. Hepatitis C Blood testing is recommended for:  Everyone born from [birth year] through 1967.  Anyone with known risk factors for hepatitis C.  Sexually Transmitted Diseases (STDs)  You should be screened each year for STDs including gonorrhea and chlamydia if: ? You are sexually active and are younger than 48 years of age. ? You are older than 48 years of age and your health care provider tells you that you are at risk for this type of infection. ? Your sexual activity has changed since you were last screened and you are at an increased risk for chlamydia or gonorrhea. Ask your health care provider if you are at risk.  Talk with your health care provider about whether you are at high risk of being infected with HIV. Your health care provider may recommend a prescription medicine to help prevent HIV infection.  What else can I do?  Schedule regular health, dental, and eye exams.  Stay current with your vaccines (immunizations).  Do not use any tobacco products, such as cigarettes, chewing tobacco, and  e-cigarettes. If you need help quitting, ask your health care provider.  Limit alcohol intake to no more than 2 drinks per day. One drink equals 12 ounces of beer, 5 ounces of wine, or 1 ounces of hard liquor.  Do not use street drugs.  Do not share needles.  Ask your health care provider for help if you need support or information about quitting drugs.  Tell your health care provider if you often feel depressed.  Tell your health care provider if you have ever been abused or do not feel safe at home. This information is not intended to replace advice given to you by your health care provider. Make sure you discuss any questions you have with your health care provider. Document Released: 07/11/2007 Document Revised: 09/11/2015 Document Reviewed: 10/16/2014 Elsevier Interactive Patient Education  [employer].

## 2016-11-02 NOTE — Assessment & Plan Note (Signed)
Ongoing trouble.

## 2016-11-09 ENCOUNTER — Other Ambulatory Visit: Payer: Self-pay | Admitting: Family Medicine

## 2016-11-09 ENCOUNTER — Other Ambulatory Visit (INDEPENDENT_AMBULATORY_CARE_PROVIDER_SITE_OTHER): Payer: PRIVATE HEALTH INSURANCE

## 2016-11-09 DIAGNOSIS — R6882 Decreased libido: Secondary | ICD-10-CM

## 2016-11-09 DIAGNOSIS — E349 Endocrine disorder, unspecified: Secondary | ICD-10-CM

## 2016-11-09 LAB — TESTOSTERONE: Testosterone: 176.59 ng/dL — ABNORMAL LOW (ref 300.00–890.00)

## 2016-11-11 ENCOUNTER — Other Ambulatory Visit (INDEPENDENT_AMBULATORY_CARE_PROVIDER_SITE_OTHER): Payer: PRIVATE HEALTH INSURANCE

## 2016-11-11 DIAGNOSIS — E349 Endocrine disorder, unspecified: Secondary | ICD-10-CM | POA: Diagnosis not present

## 2016-11-11 LAB — CBC WITH DIFFERENTIAL/PLATELET
Basophils Absolute: 0.1 10*3/uL (ref 0.0–0.1)
Basophils Relative: 1.1 % (ref 0.0–3.0)
Eosinophils Absolute: 0.3 10*3/uL (ref 0.0–0.7)
Eosinophils Relative: 4.4 % (ref 0.0–5.0)
HCT: 42.2 % (ref 39.0–52.0)
Hemoglobin: 14 g/dL (ref 13.0–17.0)
Lymphocytes Relative: 36.5 % (ref 12.0–46.0)
Lymphs Abs: 2.2 10*3/uL (ref 0.7–4.0)
MCHC: 33.2 g/dL (ref 30.0–36.0)
MCV: 86.6 fl (ref 78.0–100.0)
Monocytes Absolute: 0.5 10*3/uL (ref 0.1–1.0)
Monocytes Relative: 8.3 % (ref 3.0–12.0)
Neutro Abs: 3 10*3/uL (ref 1.4–7.7)
Neutrophils Relative %: 49.7 % (ref 43.0–77.0)
Platelets: 246 10*3/uL (ref 150.0–400.0)
RBC: 4.88 Mil/uL (ref 4.22–5.81)
RDW: 14 % (ref 11.5–15.5)
WBC: 6.1 10*3/uL (ref 4.0–10.5)

## 2016-11-11 LAB — HEPATIC FUNCTION PANEL
ALT: 38 U/L (ref 0–53)
AST: 24 U/L (ref 0–37)
Albumin: 3.9 g/dL (ref 3.5–5.2)
Alkaline Phosphatase: 63 U/L (ref 39–117)
Bilirubin, Direct: 0.1 mg/dL (ref 0.0–0.3)
Total Bilirubin: 0.4 mg/dL (ref 0.2–1.2)
Total Protein: 6.5 g/dL (ref 6.0–8.3)

## 2016-11-11 LAB — LUTEINIZING HORMONE: LH: 4.5 m[IU]/mL (ref 1.50–9.30)

## 2016-11-11 LAB — FOLLICLE STIMULATING HORMONE: FSH: 7.1 m[IU]/mL (ref 1.4–18.1)

## 2016-11-11 LAB — PSA: PSA: 0.34 ng/mL (ref 0.10–4.00)

## 2016-11-15 ENCOUNTER — Other Ambulatory Visit: Payer: Self-pay | Admitting: Family Medicine

## 2016-11-16 ENCOUNTER — Other Ambulatory Visit: Payer: PRIVATE HEALTH INSURANCE

## 2016-11-17 LAB — TESTOS,TOTAL,FREE AND SHBG (FEMALE)
Free Testosterone: 75.1 pg/mL (ref 35.0–155.0)
Sex Hormone Binding: 17 nmol/L (ref 10–50)
Testosterone, Total, LC-MS-MS: 328 ng/dL (ref 250–1100)

## 2016-11-18 ENCOUNTER — Other Ambulatory Visit: Payer: Self-pay | Admitting: Family Medicine

## 2016-11-18 ENCOUNTER — Encounter: Payer: Self-pay | Admitting: Family Medicine

## 2016-11-29 ENCOUNTER — Encounter: Payer: Self-pay | Admitting: Family Medicine

## 2016-12-14 ENCOUNTER — Encounter: Payer: Self-pay | Admitting: Family Medicine

## 2017-10-22 ENCOUNTER — Other Ambulatory Visit: Payer: Self-pay | Admitting: Family Medicine

## 2017-10-31 ENCOUNTER — Other Ambulatory Visit: Payer: Self-pay | Admitting: Family Medicine

## 2017-10-31 DIAGNOSIS — E785 Hyperlipidemia, unspecified: Secondary | ICD-10-CM

## 2017-10-31 DIAGNOSIS — R739 Hyperglycemia, unspecified: Secondary | ICD-10-CM

## 2017-10-31 DIAGNOSIS — E349 Endocrine disorder, unspecified: Secondary | ICD-10-CM

## 2017-11-01 ENCOUNTER — Other Ambulatory Visit: Payer: PRIVATE HEALTH INSURANCE

## 2017-11-01 ENCOUNTER — Other Ambulatory Visit (INDEPENDENT_AMBULATORY_CARE_PROVIDER_SITE_OTHER): Payer: PRIVATE HEALTH INSURANCE

## 2017-11-01 DIAGNOSIS — R739 Hyperglycemia, unspecified: Secondary | ICD-10-CM

## 2017-11-01 DIAGNOSIS — E785 Hyperlipidemia, unspecified: Secondary | ICD-10-CM | POA: Diagnosis not present

## 2017-11-01 DIAGNOSIS — E349 Endocrine disorder, unspecified: Secondary | ICD-10-CM | POA: Diagnosis not present

## 2017-11-01 LAB — PSA: PSA: 0.68 ng/mL (ref 0.10–4.00)

## 2017-11-01 LAB — COMPREHENSIVE METABOLIC PANEL
ALT: 25 U/L (ref 0–53)
AST: 24 U/L (ref 0–37)
Albumin: 4.2 g/dL (ref 3.5–5.2)
Alkaline Phosphatase: 78 U/L (ref 39–117)
BUN: 20 mg/dL (ref 6–23)
CO2: 29 mEq/L (ref 19–32)
Calcium: 9.5 mg/dL (ref 8.4–10.5)
Chloride: 102 mEq/L (ref 96–112)
Creatinine, Ser: 1.03 mg/dL (ref 0.40–1.50)
GFR: 81.5 mL/min (ref >60.00)
Glucose, Bld: 95 mg/dL (ref 70–99)
Potassium: 4.9 mEq/L (ref 3.5–5.1)
Sodium: 137 mEq/L (ref 135–145)
Total Bilirubin: 0.7 mg/dL (ref 0.2–1.2)
Total Protein: 6.9 g/dL (ref 6.0–8.3)

## 2017-11-01 LAB — LIPID PANEL
Cholesterol: 197 mg/dL (ref 0–200)
HDL: 37.5 mg/dL — ABNORMAL LOW (ref >39.00)
LDL Cholesterol: 129 mg/dL — ABNORMAL HIGH (ref 0–99)
NonHDL: 159.79
Total CHOL/HDL Ratio: 5
Triglycerides: 154 mg/dL — ABNORMAL HIGH (ref 0.0–149.0)
VLDL: 30.8 mg/dL (ref 0.0–40.0)

## 2017-11-01 LAB — HEMOGLOBIN A1C: Hgb A1c MFr Bld: 5.7 % (ref 4.6–6.5)

## 2017-11-02 ENCOUNTER — Encounter: Payer: Self-pay | Admitting: Family Medicine

## 2017-11-02 NOTE — Assessment & Plan Note (Signed)
Preventative protocols reviewed and updated unless pt declined. Discussed healthy diet and lifestyle.  

## 2017-11-02 NOTE — Progress Notes (Signed)
BP 134/82 (BP Location: Left Arm, Patient Position: Sitting, Cuff Size: Large)   Pulse (!) 54   Temp 97.8 F (36.6 C) (Oral)   Ht 5\' 8"  (1.727 m)   Wt 238 lb 8 oz (108.2 kg)   SpO2 98%   BMI 36.26 kg/m    CC: CPE Subjective:    Patient ID: Eric Huff, male    DOB: 15-Jan-1971, 47 y.o.   MRN: [id number]  HPI: Eric Huff is a 47 y.o. male presenting on 11/03/2017 for Annual Exam   Endorses 30 lb weight loss over the last year!  Decreased added sugars in diet. Intermittent fasting.  Staying well hydrated. Drinking plenty of water.  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  Gets DOT physicals yearly.   Ongoing low sex drive. ED - ongoing trouble. Worried about low T as cause. Last year initial test returned low, confirmatory testing returned WNL.   Preventative: Flu shot - declines  Tetanus shot - 2005. Declines today.  Seat belt use discussed  Sunscreen use discussed, no changing moles on skin.  Ex smoker, quit chewing as well.  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 Interim medical history since our last visit reviewed. Allergies and medications reviewed and updated. Outpatient Medications Prior to Visit  Medication Sig Dispense Refill  . lisinopril (PRINIVIL,ZESTRIL) 20 MG tablet TAKE 1 TABLET BY MOUTH DAILY 90 tablet 0  . loratadine (CLARITIN) 10 MG tablet Take 10 mg by mouth daily.    . Naproxen Sodium (ALEVE) 220 MG CAPS Take by mouth as needed.    Marland Kitchen aspirin EC 81 MG tablet Take 81 mg by mouth daily.      No facility-administered medications prior to visit.      Per HPI unless specifically indicated in ROS section  below Review of Systems  Constitutional: Negative for activity change, appetite change, chills, fatigue, fever and unexpected weight change.  HENT: Negative for hearing loss.   Eyes: Negative for visual disturbance.  Respiratory: Negative for cough, chest tightness, shortness of breath and wheezing.   Cardiovascular: Negative for chest pain, palpitations and leg swelling.  Gastrointestinal: Negative for abdominal distention, abdominal pain, blood in stool, constipation, diarrhea, nausea and vomiting.  Genitourinary: Negative for difficulty urinating and hematuria.  Musculoskeletal: Negative for arthralgias, myalgias and neck pain.  Skin: Negative for rash.  Neurological: Negative for dizziness, seizures, syncope and headaches.  Hematological: Negative for adenopathy. Does not bruise/bleed easily.  Psychiatric/Behavioral: Negative for dysphoric mood. The patient is not nervous/anxious.        Objective:    BP 134/82 (BP Location: Left Arm, Patient Position: Sitting, Cuff Size: Large)   Pulse (!) 54   Temp 97.8 F (36.6 C) (Oral)   Ht 5\' 8"  (1.727 m)   Wt 238 lb 8 oz (108.2 kg)   SpO2 98%   BMI 36.26 kg/m   Wt Readings from Last 3 Encounters:  11/03/17 238 lb 8 oz (108.2 kg)  11/02/16 258 lb 4 oz (117.1 kg)  10/30/15 231 lb 8 oz (105 kg)    Physical Exam  Constitutional: He is oriented to person, place, and time. He appears well-developed and well-nourished. No distress.  HENT:  Head: Normocephalic and atraumatic.  Right Ear: Hearing, tympanic membrane, external ear and ear canal normal.  Left Ear: Hearing, tympanic membrane, external ear and ear canal normal.  Nose: Nose normal.  Mouth/Throat: Uvula is midline, oropharynx is clear and moist and mucous membranes are normal. No oropharyngeal exudate, posterior oropharyngeal edema or posterior oropharyngeal erythema.  Eyes: Pupils are equal, round, and reactive to light. Conjunctivae and EOM are normal. No scleral icterus.   Neck: Normal range of motion. Neck supple.  Cardiovascular: Normal rate, regular rhythm, normal heart sounds and intact distal pulses.  No murmur heard. Pulses:      Radial pulses are 2+ on the right side, and 2+ on the left side.  Pulmonary/Chest: Effort normal and breath sounds normal. No respiratory distress. He has no wheezes. He has no rales.  Abdominal: Soft. Bowel sounds are normal. He exhibits no distension and no mass. There is no tenderness. There is no rebound and no guarding.  Musculoskeletal: Normal range of motion. He exhibits no edema.  Lymphadenopathy:    He has no cervical adenopathy.  Neurological: He is alert and oriented to person, place, and time.  CN grossly intact, station and gait intact  Skin: Skin is warm and dry. No rash noted.  Psychiatric: He has a normal mood and affect. His behavior is normal. Judgment and thought content normal.  Nursing note and vitals reviewed.  Results for orders placed or performed in visit on 11/01/17  Hemoglobin A1c  Result Value Ref Range   Hgb A1c MFr Bld 5.7 4.6 - 6.5 %  PSA  Result Value Ref Range   PSA 0.68 0.10 - 4.00 ng/mL  Comprehensive metabolic panel  Result Value Ref Range   Sodium 137 135 - 145 mEq/L   Potassium 4.9 3.5 - 5.1 mEq/L   Chloride 102 96 - 112 mEq/L   CO2 29 19 - 32 mEq/L   Glucose, Bld 95 70 - 99 mg/dL   BUN 20 6 - 23 mg/dL   Creatinine, Ser 4.09 0.40 - 1.50 mg/dL   Total Bilirubin 0.7 0.2 - 1.2 mg/dL   Alkaline Phosphatase 78 39 - 117 U/L   AST 24 0 - 37 U/L   ALT 25 0 - 53 U/L   Total Protein 6.9 6.0 - 8.3 g/dL   Albumin 4.2 3.5 - 5.2 g/dL   Calcium 9.5 8.4 - 81.1 mg/dL   GFR 91.47 >82.95 mL/min  Lipid panel  Result Value Ref Range   Cholesterol 197 0 - 200 mg/dL   Triglycerides 621.3 (H) 0.0 - 149.0 mg/dL   HDL 08.65 (L) >78.46 mg/dL   VLDL 96.2 0.0 - 95.2 mg/dL   LDL Cholesterol 841 (H) 0 - 99 mg/dL   Total CHOL/HDL Ratio 5    NonHDL 159.79       Assessment & Plan:   Problem List  Items Addressed This Visit    Severe obesity (BMI 35.0-39.9) with comorbidity (HCC)    Congratulated on weight loss to date - pt motivated to continue healthy diet changes. Encouraged he incorporate regular exercise into routine.      Prediabetes    Reviewed A1c with patient as well as implications. Continue to avoid added sugars in diet.       Hypotestosteronism    Ongoing low libido. Energy levels have improved with weight loss. Will await repeat testing this year - he did not come at 8am for recent fasting labs.       Health maintenance  examination - Primary    Preventative protocols reviewed and updated unless pt declined. Discussed healthy diet and lifestyle.       Essential hypertension    Chronic, stable. Continue current regimen.       Relevant Medications   sildenafil (REVATIO) 20 MG tablet   Erectile dysfunction    Ongoing trouble.  Discussed with patient as well as treatment options. Will price out generic sildenafil 20mg  dose, aware it will not be covered by insurance. Reviewed common side effects as well as adverse events to watch for including headache, priapism. To let us know if any chest pain with sex. Aware must avoid mixing with nitrate.  Requests sildenafil sent to different [facility].       Dyslipidemia    Chronic, improved readings with healthy diet changes. Not on medication. The 10-year ASCVD risk score Denman George DC Montez Hageman., et al., 2013) is: 5.3%   Values used to calculate the score:     Age: 33 years     Sex: Male     Is Non-Hispanic African American: No     Diabetic: No     Tobacco smoker: No     Systolic Blood Pressure: 134 mmHg     Is BP treated: Yes     HDL Cholesterol: 37.5 mg/dL     Total Cholesterol: 197 mg/dL           Meds ordered this encounter  Medications  . sildenafil (REVATIO) 20 MG tablet    Sig: Take 2-5 tablets (40-100 mg total) by mouth daily as needed (relations).    Dispense:  30 tablet     Refill:  0   No orders of the defined types were placed in this encounter.   Follow up plan: Return in about 1 year (around 11/04/2018) for annual exam, prior fasting for blood work.  Eustaquio Boyden, MD

## 2017-11-03 ENCOUNTER — Ambulatory Visit (INDEPENDENT_AMBULATORY_CARE_PROVIDER_SITE_OTHER): Payer: PRIVATE HEALTH INSURANCE | Admitting: Family Medicine

## 2017-11-03 ENCOUNTER — Encounter: Payer: Self-pay | Admitting: Family Medicine

## 2017-11-03 VITALS — BP 134/82 | HR 54 | Temp 97.8°F | Ht 68.0 in | Wt 238.5 lb

## 2017-11-03 DIAGNOSIS — R7303 Prediabetes: Secondary | ICD-10-CM | POA: Diagnosis not present

## 2017-11-03 DIAGNOSIS — E349 Endocrine disorder, unspecified: Secondary | ICD-10-CM

## 2017-11-03 DIAGNOSIS — Z0000 Encounter for general adult medical examination without abnormal findings: Secondary | ICD-10-CM

## 2017-11-03 DIAGNOSIS — N529 Male erectile dysfunction, unspecified: Secondary | ICD-10-CM

## 2017-11-03 DIAGNOSIS — E785 Hyperlipidemia, unspecified: Secondary | ICD-10-CM

## 2017-11-03 DIAGNOSIS — I10 Essential (primary) hypertension: Secondary | ICD-10-CM

## 2017-11-03 MED ORDER — SILDENAFIL CITRATE 20 MG PO TABS: 40.0000 mg | ORAL_TABLET | Freq: Every day | ORAL | 0 refills | Status: DC | PRN

## 2017-11-03 NOTE — Assessment & Plan Note (Signed)
Congratulated on weight loss to date - pt motivated to continue healthy diet changes. Encouraged he incorporate regular exercise into routine.

## 2017-11-03 NOTE — Assessment & Plan Note (Signed)
Chronic, stable. Continue current regimen. 

## 2017-11-03 NOTE — Assessment & Plan Note (Signed)
Reviewed A1c with patient as well as implications. Continue to avoid added sugars in diet.

## 2017-11-03 NOTE — Assessment & Plan Note (Addendum)
Ongoing low libido. Energy levels have improved with weight loss. Will await repeat testing this year - he did not come at 8am for recent fasting labs.

## 2017-11-03 NOTE — Assessment & Plan Note (Addendum)
Chronic, improved readings with healthy diet changes. Not on medication. The 10-year ASCVD risk score Denman George DC Montez Hageman., et al., 2013) is: 5.3%   Values used to calculate the score:     Age: 47 years     Sex: Male     Is Non-Hispanic African American: No     Diabetic: No     Tobacco smoker: No     Systolic Blood Pressure: 134 mmHg     Is BP treated: Yes     HDL Cholesterol: 37.5 mg/dL     Total Cholesterol: 197 mg/dL

## 2017-11-03 NOTE — Patient Instructions (Addendum)
Congratulations on weight loss to date! Keep it up! Price out sildenafil 20mg  dose sent to [facility]. Let us know how this works.  You are doing well today. Return sa needed or in 1 year for next physical.  Health Maintenance, Male A healthy lifestyle and preventive care is important for your health and wellness. Ask your health care provider about what schedule of regular examinations is right for you. What should I know about weight and diet? Eat a Healthy Diet  Eat plenty of vegetables, fruits, whole grains, low-fat dairy products, and lean protein.  Do not eat a lot of foods high in solid fats, added sugars, or salt.  Maintain a Healthy Weight Regular exercise can help you achieve or maintain a healthy weight. You should:  Do at least 150 minutes of exercise each week. The exercise should increase your heart rate and make you sweat (moderate-intensity exercise).  Do strength-training exercises at least twice a week.  Watch Your Levels of Cholesterol and Blood Lipids  Have your blood tested for lipids and cholesterol every 5 years starting at 49 years of age. If you are at high risk for heart disease, you should start having your blood tested when you are 49 years old. You may need to have your cholesterol levels checked more often if: ? Your lipid or cholesterol levels are high. ? You are older than 49 years of age. ? You are at high risk for heart disease.  What should I know about cancer screening? Many types of cancers can be detected early and may often be prevented. Lung Cancer  You should be screened every year for lung cancer if: ? You are a current smoker who has smoked for at least 30 years. ? You are a former smoker who has quit within the past 15 years.  Talk to your health care provider about your screening options, when you should start screening, and how often you should be screened.  Colorectal Cancer  Routine colorectal cancer screening usually begins at  49 years of age and should be repeated every 5-10 years until you are 49 years old. You may need to be screened more often if early forms of precancerous polyps or small growths are found. Your health care provider may recommend screening at an earlier age if you have risk factors for colon cancer.  Your health care provider may recommend using home test kits to check for hidden blood in the stool.  A small camera at the end of a tube can be used to examine your colon (sigmoidoscopy or colonoscopy). This checks for the earliest forms of colorectal cancer.  Prostate and Testicular Cancer  Depending on your age and overall health, your health care provider may do certain tests to screen for prostate and testicular cancer.  Talk to your health care provider about any symptoms or concerns you have about testicular or prostate cancer.  Skin Cancer  Check your skin from head to toe regularly.  Tell your health care provider about any new moles or changes in moles, especially if: ? There is a change in a mole's size, shape, or color. ? You have a mole that is larger than a pencil eraser.  Always use sunscreen. Apply sunscreen liberally and repeat throughout the day.  Protect yourself by wearing long sleeves, pants, a wide-brimmed hat, and sunglasses when outside.  What should I know about heart disease, diabetes, and high blood pressure?  If you are 80-20 years of age, have your  blood pressure checked every 3-5 years. If you are 37 years of age or older, have your blood pressure checked every year. You should have your blood pressure measured twice-once when you are at a hospital or clinic, and once when you are not at a hospital or clinic. Record the average of the two measurements. To check your blood pressure when you are not at a hospital or clinic, you can use: ? An automated blood pressure machine at a pharmacy. ? A home blood pressure monitor.  Talk to your health care provider about  your target blood pressure.  If you are between 58-75 years old, ask your health care provider if you should take aspirin to prevent heart disease.  Have regular diabetes screenings by checking your fasting blood sugar level. ? If you are at a normal weight and have a low risk for diabetes, have this test once every three years after the age of 51. ? If you are overweight and have a high risk for diabetes, consider being tested at a younger age or more often.  A one-time screening for abdominal aortic aneurysm (AAA) by ultrasound is recommended for men aged 65-75 years who are current or former smokers. What should I know about preventing infection? Hepatitis B If you have a higher risk for hepatitis B, you should be screened for this virus. Talk with your health care provider to find out if you are at risk for hepatitis B infection. Hepatitis C Blood testing is recommended for:  Everyone born from [birth year] through 1965.  Anyone with known risk factors for hepatitis C.  Sexually Transmitted Diseases (STDs)  You should be screened each year for STDs including gonorrhea and chlamydia if: ? You are sexually active and are younger than 49 years of age. ? You are older than 49 years of age and your health care provider tells you that you are at risk for this type of infection. ? Your sexual activity has changed since you were last screened and you are at an increased risk for chlamydia or gonorrhea. Ask your health care provider if you are at risk.  Talk with your health care provider about whether you are at high risk of being infected with HIV. Your health care provider may recommend a prescription medicine to help prevent HIV infection.  What else can I do?  Schedule regular health, dental, and eye exams.  Stay current with your vaccines (immunizations).  Do not use any tobacco products, such as cigarettes, chewing tobacco, and e-cigarettes. If you need help quitting, ask your health care  provider.  Limit alcohol intake to no more than 2 drinks per day. One drink equals 12 ounces of beer, 5 ounces of wine, or 1 ounces of hard liquor.  Do not use street drugs.  Do not share needles.  Ask your health care provider for help if you need support or information about quitting drugs.  Tell your health care provider if you often feel depressed.  Tell your health care provider if you have ever been abused or do not feel safe at home. This information is not intended to replace advice given to you by your health care provider. Make sure you discuss any questions you have with your health care provider. Document Released: 07/11/2007 Document Revised: 09/11/2015 Document Reviewed: 10/16/2014 Elsevier Interactive Patient Education  [employer].

## 2017-11-03 NOTE — Assessment & Plan Note (Addendum)
Ongoing trouble.  Discussed with patient as well as treatment options. Will price out generic sildenafil 20mg  dose, aware it will not be covered by insurance. Reviewed common side effects as well as adverse events to watch for including headache, priapism. To let us know if any chest pain with sex. Aware must avoid mixing with nitrate.  Requests [facility]ildenafil sent [facility]

## 2017-11-22 ENCOUNTER — Other Ambulatory Visit: Payer: Self-pay | Admitting: Family Medicine

## 2018-07-26 ENCOUNTER — Other Ambulatory Visit: Payer: Self-pay | Admitting: *Deleted

## 2018-07-26 DIAGNOSIS — Z20822 Contact with and (suspected) exposure to covid-19: Secondary | ICD-10-CM

## 2018-07-26 NOTE — Progress Notes (Signed)
[id number] 

## 2018-07-31 LAB — NOVEL CORONAVIRUS, NAA: SARS-CoV-2, NAA: NOT DETECTED

## 2018-11-07 ENCOUNTER — Other Ambulatory Visit (INDEPENDENT_AMBULATORY_CARE_PROVIDER_SITE_OTHER): Payer: BC Managed Care – PPO

## 2018-11-07 ENCOUNTER — Other Ambulatory Visit: Payer: Self-pay | Admitting: Family Medicine

## 2018-11-07 DIAGNOSIS — E785 Hyperlipidemia, unspecified: Secondary | ICD-10-CM

## 2018-11-07 DIAGNOSIS — R7303 Prediabetes: Secondary | ICD-10-CM

## 2018-11-07 DIAGNOSIS — Z125 Encounter for screening for malignant neoplasm of prostate: Secondary | ICD-10-CM

## 2018-11-07 DIAGNOSIS — E349 Endocrine disorder, unspecified: Secondary | ICD-10-CM | POA: Diagnosis not present

## 2018-11-07 LAB — COMPREHENSIVE METABOLIC PANEL
ALT: 21 U/L (ref 0–53)
AST: 25 U/L (ref 0–37)
Albumin: 4.4 g/dL (ref 3.5–5.2)
Alkaline Phosphatase: 73 U/L (ref 39–117)
BUN: 22 mg/dL (ref 6–23)
CO2: 29 mEq/L (ref 19–32)
Calcium: 9.8 mg/dL (ref 8.4–10.5)
Chloride: 100 mEq/L (ref 96–112)
Creatinine, Ser: 1.31 mg/dL (ref 0.40–1.50)
GFR: 57.86 mL/min — ABNORMAL LOW (ref >60.00)
Glucose, Bld: 93 mg/dL (ref 70–99)
Potassium: 4.7 mEq/L (ref 3.5–5.1)
Sodium: 138 mEq/L (ref 135–145)
Total Bilirubin: 0.6 mg/dL (ref 0.2–1.2)
Total Protein: 6.9 g/dL (ref 6.0–8.3)

## 2018-11-07 LAB — LIPID PANEL
Cholesterol: 163 mg/dL (ref 0–200)
HDL: 39.6 mg/dL (ref >39.00)
LDL Cholesterol: 96 mg/dL (ref 0–99)
NonHDL: 123.33
Total CHOL/HDL Ratio: 4
Triglycerides: 138 mg/dL (ref 0.0–149.0)
VLDL: 27.6 mg/dL (ref 0.0–40.0)

## 2018-11-07 LAB — HEMOGLOBIN A1C: Hgb A1c MFr Bld: 5.8 % (ref 4.6–6.5)

## 2018-11-07 LAB — PSA: PSA: 0.78 ng/mL (ref 0.10–4.00)

## 2018-11-10 ENCOUNTER — Ambulatory Visit (INDEPENDENT_AMBULATORY_CARE_PROVIDER_SITE_OTHER): Payer: BC Managed Care – PPO | Admitting: Family Medicine

## 2018-11-10 ENCOUNTER — Encounter: Payer: Self-pay | Admitting: Family Medicine

## 2018-11-10 ENCOUNTER — Other Ambulatory Visit: Payer: Self-pay

## 2018-11-10 VITALS — BP 132/82 | HR 60 | Temp 97.8°F | Ht 68.0 in | Wt 219.1 lb

## 2018-11-10 DIAGNOSIS — Z23 Encounter for immunization: Secondary | ICD-10-CM | POA: Diagnosis not present

## 2018-11-10 DIAGNOSIS — Z0000 Encounter for general adult medical examination without abnormal findings: Secondary | ICD-10-CM | POA: Diagnosis not present

## 2018-11-10 DIAGNOSIS — E669 Obesity, unspecified: Secondary | ICD-10-CM

## 2018-11-10 DIAGNOSIS — Z1211 Encounter for screening for malignant neoplasm of colon: Secondary | ICD-10-CM

## 2018-11-10 DIAGNOSIS — I10 Essential (primary) hypertension: Secondary | ICD-10-CM

## 2018-11-10 DIAGNOSIS — E785 Hyperlipidemia, unspecified: Secondary | ICD-10-CM

## 2018-11-10 DIAGNOSIS — R7303 Prediabetes: Secondary | ICD-10-CM

## 2018-11-10 MED ORDER — LISINOPRIL 20 MG PO TABS: 20.0000 mg | ORAL_TABLET | Freq: Every day | ORAL | 3 refills | Status: DC

## 2018-11-10 NOTE — Assessment & Plan Note (Signed)
Chronic, continued improvement noted. Congratulated. The 10-year ASCVD risk score Eric Huff Eric Huff., et al., 2013) is: 4.1%   Values used to calculate the score:     Age: 48 years     Sex: Male     Is Non-Hispanic African American: No     Diabetic: No     Tobacco smoker: No     Systolic Blood Pressure: 716 mmHg     Is BP treated: Yes     HDL Cholesterol: 39.6 mg/dL     Total Cholesterol: 163 mg/dL

## 2018-11-10 NOTE — Assessment & Plan Note (Signed)
Preventative protocols reviewed and updated unless pt declined. Discussed healthy diet and lifestyle.  

## 2018-11-10 NOTE — Patient Instructions (Addendum)
Tdap today.  Pass by lab to pick up stool kit.  Ok to trial 1/2 tablet of lisinopril - monitor BP at home, goal <140/90.  You are doing well today. Congratulations on weight loss! Increase water intake.  Return in 1 year for next physical  Health Maintenance, Male Adopting a healthy lifestyle and getting preventive care are important in promoting health and wellness. Ask your health care provider about:  The right schedule for you to have regular tests and exams.  Things you can do on your own to prevent diseases and keep yourself healthy. What should I know about diet, weight, and exercise? Eat a healthy diet   Eat a diet that includes plenty of vegetables, fruits, low-fat dairy products, and lean protein.  Do not eat a lot of foods that are high in solid fats, added sugars, or sodium. Maintain a healthy weight Body mass index (BMI) is a measurement that can be used to identify possible weight problems. It estimates body fat based on height and weight. Your health care provider can help determine your BMI and help you achieve or maintain a healthy weight. Get regular exercise Get regular exercise. This is one of the most important things you can do for your health. Most adults should:  Exercise for at least 150 minutes each week. The exercise should increase your heart rate and make you sweat (moderate-intensity exercise).  Do strengthening exercises at least twice a week. This is in addition to the moderate-intensity exercise.  Spend less time sitting. Even light physical activity can be beneficial. Watch cholesterol and blood lipids Have your blood tested for lipids and cholesterol at 50 years of age, then have this test every 5 years. You may need to have your cholesterol levels checked more often if:  Your lipid or cholesterol levels are high.  You are older than 50 years of age.  You are at high risk for heart disease. What should I know about cancer screening? Many  types of cancers can be detected early and may often be prevented. Depending on your health history and family history, you may need to have cancer screening at various ages. This may include screening for:  Colorectal cancer.  Prostate cancer.  Skin cancer.  Lung cancer. What should I know about heart disease, diabetes, and high blood pressure? Blood pressure and heart disease  High blood pressure causes heart disease and increases the risk of stroke. This is more likely to develop in people who have high blood pressure readings, are of African descent, or are overweight.  Talk with your health care provider about your target blood pressure readings.  Have your blood pressure checked: ? Every 3-5 years if you are 71-54 years of age. ? Every year if you are 54 years old or older.  If you are between the ages of 35 and 79 and are a current or former smoker, ask your health care provider if you should have a one-time screening for abdominal aortic aneurysm (AAA). Diabetes Have regular diabetes screenings. This checks your fasting blood sugar level. Have the screening done:  Once every three years after age 60 if you are at a normal weight and have a low risk for diabetes.  More often and at a younger age if you are overweight or have a high risk for diabetes. What should I know about preventing infection? Hepatitis B If you have a higher risk for hepatitis B, you should be screened for this virus. Talk with your health  care provider to find out if you are at risk for hepatitis B infection. Hepatitis C Blood testing is recommended for:  Everyone born from [birth year] through 1965.  Anyone with known risk factors for hepatitis C. Sexually transmitted infections (STIs)  You should be screened each year for STIs, including gonorrhea and chlamydia, if: ? You are sexually active and are younger than 50 years of age. ? You are older than 50 years of age and your health care provider tells you  that you are at risk for this type of infection. ? Your sexual activity has changed since you were last screened, and you are at increased risk for chlamydia or gonorrhea. Ask your health care provider if you are at risk.  Ask your health care provider about whether you are at high risk for HIV. Your health care provider may recommend a prescription medicine to help prevent HIV infection. If you choose to take medicine to prevent HIV, you should first get tested for HIV. You should then be tested every 3 months for as long as you are taking the medicine. Follow these instructions at home: Lifestyle  Do not use any products that contain nicotine or tobacco, such as cigarettes, e-cigarettes, and chewing tobacco. If you need help quitting, ask your health care provider.  Do not use street drugs.  Do not share needles.  Ask your health care provider for help if you need support or information about quitting drugs. Alcohol use  Do not drink alcohol if your health care provider tells you not to drink.  If you drink alcohol: ? Limit how much you have to 0-2 drinks a day. ? Be aware of how much alcohol is in your drink. In the [place] one drink equals one 12 oz bottle of beer (355 mL), one 5 oz glass of wine (148 mL), or one 1 oz glass of hard liquor (44 mL). General instructions  Schedule regular health, dental, and eye exams.  Stay current with your vaccines.  Tell your health care provider if: ? You often feel depressed. ? You have ever been abused or do not feel safe at home. Summary  Adopting a healthy lifestyle and getting preventive care are important in promoting health and wellness.  Follow your health care provider's instructions about healthy diet, exercising, and getting tested or screened for diseases.  Follow your health care provider's instructions on monitoring your cholesterol and blood pressure. This information is not intended to replace advice given to you by your  health care provider. Make sure you discuss any questions you have with your health care provider. Document Released: 07/11/2007 Document Revised: 01/05/2018 Document Reviewed: 01/05/2018 Elsevier Patient Education  2020 [employer].

## 2018-11-10 NOTE — Assessment & Plan Note (Signed)
Marked ongoing weight loss, intentional. Congratulated. Pt motivated to continue process. He has been using OTC appetite suppressant RioLean Extreme.

## 2018-11-10 NOTE — Assessment & Plan Note (Addendum)
Chronic, stable on current regimen. With noted weight loss, reasonable to try lower lisinopril dose as long as he monitors bp at home to ensure staying well controlled.  Bump in Cr noted - ?from overtreatment given marked weight loss over last 2 years. Encouraged good hydration status. He declines returning prior to 1 yr for labs.

## 2018-11-10 NOTE — Assessment & Plan Note (Signed)
Mild, stable. Encouraged limiting sugars.

## 2018-11-10 NOTE — Progress Notes (Signed)
This visit was conducted in person.  BP 132/82 (BP Location: Left Arm, Patient Position: Sitting, Cuff Size: Normal)   Pulse 60   Temp 97.8 F (36.6 C) (Temporal)   Ht 5\' 8"  (1.727 m)   Wt 219 lb 1 oz (99.4 kg)   SpO2 98%   BMI 33.31 kg/m    CC: CPE  Subjective:    Patient ID: , male    DOB: Mar 04, 1970, 48 y.o.   MRN: [id number]  HPI: Eric Huff is a 48 y.o. male presenting on 11/10/2018 for Annual Exam   Weight loss over the past several years-  Up to 50 lbs! Has done intermittent fasting. Using 11/12/2018 Extreme OTC appetite suppressant.   Preventative: Colon cancer screening - discussed, would like iFOB. Prostate cancer - discussed, denies prostate symptoms. H/o prostatitis. Would like PSA to start.  Flu shot - declines  Tetanus shot - 2005. Tdap today.  Seat belt use discussed  Sunscreen use discussed, no changing moles on skin.  Ex smoker, quit chewing as well. 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 Interim medical history since our last visit reviewed. Allergies and medications reviewed and updated. Outpatient Medications Prior to Visit  Medication Sig Dispense Refill  . CALCIUM-MAGNESIUM-ZINC PO Take 3 tablets by mouth daily.    [employer] loratadine (CLARITIN) 10 MG tablet Take 10 mg by mouth daily.    Marland Kitchen MISC NATURAL PRODUCTS PO Take by mouth daily. Marland Kitchen Extreme    . MISC NATURAL PRODUCTS PO Take by mouth 4 (four) times daily - after meals and at bedtime. Testosterone Booster    . Naproxen Sodium (ALEVE) 220 MG CAPS Take by mouth as needed.    [employer] lisinopril (ZESTRIL) 20 MG tablet TAKE ONE TABLET EVERY DAY 90 tablet 0  .  sildenafil (REVATIO) 20 MG tablet Take 2-5 tablets (40-100 mg total) by mouth daily as needed (relations). 30 tablet 0   No facility-administered medications prior to visit.      Per HPI unless specifically indicated in ROS section below Review of Systems  Constitutional: Negative for activity change, appetite change, chills, fatigue, fever and unexpected weight change.  HENT: Negative for hearing loss.   Eyes: Negative for visual disturbance.  Respiratory: Negative for cough, chest tightness, shortness of breath and wheezing.   Cardiovascular: Negative for chest pain, palpitations and leg swelling.  Gastrointestinal: Negative for abdominal distention, abdominal pain, blood in stool, constipation, diarrhea, nausea and vomiting.  Genitourinary: Negative for difficulty urinating and hematuria.  Musculoskeletal: Negative for arthralgias, myalgias and neck pain.  Skin: Negative for rash.  Neurological: Negative for dizziness, seizures, syncope and headaches.  Hematological: Negative for adenopathy. Does not bruise/bleed easily.  Psychiatric/Behavioral: Negative for dysphoric mood. The patient is not nervous/anxious.    Objective:    BP 132/82 (BP Location: Left Arm, Patient Position: Sitting, Cuff Size: Normal)   Pulse 60   Temp 97.8 F (36.6 C) (Temporal)   Ht 5\' 8"  (1.727 m)   Wt 219 lb 1 oz (99.4 kg)   SpO2 98%   BMI 33.31 kg/m   Wt Readings from Last 3 Encounters:  11/10/18 219 lb 1 oz (99.4 kg)  11/03/17 238 lb 8 oz (108.2 kg)  11/02/16 258 lb 4 oz (117.1 kg)    Physical Exam Vitals signs and nursing note reviewed.  Constitutional:      General: He is not in acute distress.    Appearance: Normal appearance. He is well-developed. He is not ill-appearing.  HENT:     Head: Normocephalic and atraumatic.     Right Ear: Hearing, tympanic membrane, ear canal and external ear normal.     Left Ear: Hearing, tympanic membrane, ear canal and external ear normal.     Nose: Nose  normal.     Mouth/Throat:     Mouth: Mucous membranes are moist.     Pharynx: Oropharynx is clear. Uvula midline. No posterior oropharyngeal erythema.  Eyes:     General: No scleral icterus.    Extraocular Movements: Extraocular movements intact.     Conjunctiva/sclera: Conjunctivae normal.     Pupils: Pupils are equal, round, and reactive to light.  Neck:     Musculoskeletal: Normal range of motion and neck supple.  Cardiovascular:     Rate and Rhythm: Normal rate and regular rhythm.     Pulses: Normal pulses.          Radial pulses are 2+ on the right side and 2+ on the left side.     Heart sounds: Normal heart sounds. No murmur.  Pulmonary:     Effort: Pulmonary effort is normal. No respiratory distress.     Breath sounds: Normal breath sounds. No wheezing, rhonchi or rales.  Abdominal:     General: Abdomen is flat. Bowel sounds are normal. There is no distension.     Palpations: Abdomen is soft. There is no mass.     Tenderness: There is no abdominal tenderness. There is no guarding or rebound.     Hernia: No hernia is present.  Musculoskeletal: Normal range of motion.     Right lower leg: No edema.     Left lower leg: No edema.  Lymphadenopathy:     Cervical: No cervical adenopathy.  Skin:    General: Skin is warm and dry.     Findings: No rash.  Neurological:     General: No focal deficit present.     Mental Status: He is alert and oriented to person, place, and time.     Comments: CN grossly intact, station and gait intact  Psychiatric:        Mood and Affect: Mood normal.        Behavior: Behavior normal.        Thought Content: Thought content normal.        Judgment: Judgment normal.       Results for orders placed or performed in visit on 11/07/18  PSA  Result Value Ref Range   PSA 0.78 0.10 - 4.00 ng/mL  Hemoglobin A1c  Result Value Ref Range   Hgb A1c MFr Bld 5.8 4.6 - 6.5 %  Comprehensive metabolic panel  Result Value Ref Range   Sodium 138 135 - 145  mEq/L   Potassium 4.7 3.5 - 5.1 mEq/L   Chloride 100 96 - 112 mEq/L   CO2 29 19 - 32 mEq/L   Glucose, Bld 93 70 - 99 mg/dL   BUN 22 6 - 23 mg/dL   Creatinine, Ser 1.611.31 0.40 - 1.50 mg/dL   Total Bilirubin 0.6 0.2 - 1.2 mg/dL   Alkaline Phosphatase 73 39 - 117 U/L   AST 25 0 - 37 U/L  ALT 21 0 - 53 U/L   Total Protein 6.9 6.0 - 8.3 g/dL   Albumin 4.4 3.5 - 5.2 g/dL   Calcium 9.8 8.4 - 10.5 mg/dL   GFR 57.86 (L) >60.00 mL/min  Lipid panel  Result Value Ref Range   Cholesterol 163 0 - 200 mg/dL   Triglycerides 138.0 0.0 - 149.0 mg/dL   HDL 39.60 >39.00 mg/dL   VLDL 27.6 0.0 - 40.0 mg/dL   LDL Cholesterol 96 0 - 99 mg/dL   Total CHOL/HDL Ratio 4    NonHDL 123.33    Assessment & Plan:   Problem List Items Addressed This Visit    Prediabetes    Mild, stable. Encouraged limiting sugars.       Obesity, Class I, BMI 30.0-34.9 (see actual BMI)    Marked ongoing weight loss, intentional. Congratulated. Pt motivated to continue process. He has been using OTC appetite suppressant RioLean Extreme.      Health maintenance examination - Primary    Preventative protocols reviewed and updated unless pt declined. Discussed healthy diet and lifestyle.       Essential hypertension    Chronic, stable on current regimen. With noted weight loss, reasonable to try lower lisinopril dose as long as he monitors bp at home to ensure staying well controlled.  Bump in Cr noted - ?from overtreatment given marked weight loss over last 2 years. Encouraged good hydration status. He declines returning prior to 1 yr for labs.       Relevant Medications   lisinopril (ZESTRIL) 20 MG tablet   Dyslipidemia    Chronic, continued improvement noted. Congratulated. The 10-year ASCVD risk score Mikey Bussing DC Brooke Bonito., et al., 2013) is: 4.1%   Values used to calculate the score:     Age: 71 years     Sex: Male     Is Non-Hispanic African American: No     Diabetic: No     Tobacco smoker: No     Systolic Blood  Pressure: 132 mmHg     Is BP treated: Yes     HDL Cholesterol: 39.6 mg/dL     Total Cholesterol: 163 mg/dL        Other Visit Diagnoses    Special screening for malignant neoplasms, colon       Relevant Orders   Fecal occult blood, imunochemical   Need for Tdap vaccination       Relevant Orders   Tdap vaccine greater than or equal to 7yo IM (Completed)       Meds ordered this encounter  Medications  . lisinopril (ZESTRIL) 20 MG tablet    Sig: Take 1 tablet (20 mg total) by mouth daily.    Dispense:  90 tablet    Refill:  3   Orders Placed This Encounter  Procedures  . Fecal occult blood, imunochemical    Standing Status:   Future    Standing Expiration Date:   11/10/2019  . Tdap vaccine greater than or equal to 7yo IM    Follow up plan: Return in about 1 year (around 11/10/2019) for annual exam, prior fasting for blood work.  Ria Bush, MD

## 2018-11-11 LAB — TESTOS,TOTAL,FREE AND SHBG (FEMALE)
Free Testosterone: 57.6 pg/mL (ref 35.0–155.0)
Sex Hormone Binding: 28 nmol/L (ref 10–50)
Testosterone, Total, LC-MS-MS: 277 ng/dL (ref 250–1100)

## 2019-07-05 ENCOUNTER — Encounter: Payer: Self-pay | Admitting: Family Medicine

## 2019-07-05 ENCOUNTER — Other Ambulatory Visit: Payer: Self-pay

## 2019-07-05 ENCOUNTER — Ambulatory Visit: Payer: BC Managed Care – PPO | Admitting: Family Medicine

## 2019-07-05 VITALS — BP 130/82 | HR 61 | Temp 97.9°F | Ht 68.0 in | Wt 226.2 lb

## 2019-07-05 DIAGNOSIS — M10072 Idiopathic gout, left ankle and foot: Secondary | ICD-10-CM

## 2019-07-05 DIAGNOSIS — M1A9XX0 Chronic gout, unspecified, without tophus (tophi): Secondary | ICD-10-CM | POA: Insufficient documentation

## 2019-07-05 LAB — RENAL FUNCTION PANEL
Albumin: 4.3 g/dL (ref 3.5–5.2)
BUN: 15 mg/dL (ref 6–23)
CO2: 30 mEq/L (ref 19–32)
Calcium: 9.6 mg/dL (ref 8.4–10.5)
Chloride: 102 mEq/L (ref 96–112)
Creatinine, Ser: 1.08 mg/dL (ref 0.40–1.50)
GFR: 72.11 mL/min (ref >60.00)
Glucose, Bld: 103 mg/dL — ABNORMAL HIGH (ref 70–99)
Phosphorus: 2.5 mg/dL (ref 2.3–4.6)
Potassium: 5 mEq/L (ref 3.5–5.1)
Sodium: 137 mEq/L (ref 135–145)

## 2019-07-05 LAB — CBC WITH DIFFERENTIAL/PLATELET
Basophils Absolute: 0.1 10*3/uL (ref 0.0–0.1)
Basophils Relative: 1.2 % (ref 0.0–3.0)
Eosinophils Absolute: 0.3 10*3/uL (ref 0.0–0.7)
Eosinophils Relative: 5.3 % — ABNORMAL HIGH (ref 0.0–5.0)
HCT: 44.6 % (ref 39.0–52.0)
Hemoglobin: 14.9 g/dL (ref 13.0–17.0)
Lymphocytes Relative: 31.1 % (ref 12.0–46.0)
Lymphs Abs: 1.9 10*3/uL (ref 0.7–4.0)
MCHC: 33.4 g/dL (ref 30.0–36.0)
MCV: 85.4 fl (ref 78.0–100.0)
Monocytes Absolute: 0.7 10*3/uL (ref 0.1–1.0)
Monocytes Relative: 10.9 % (ref 3.0–12.0)
Neutro Abs: 3.2 10*3/uL (ref 1.4–7.7)
Neutrophils Relative %: 51.5 % (ref 43.0–77.0)
Platelets: 251 10*3/uL (ref 150.0–400.0)
RBC: 5.22 Mil/uL (ref 4.22–5.81)
RDW: 13.8 % (ref 11.5–15.5)
WBC: 6.1 10*3/uL (ref 4.0–10.5)

## 2019-07-05 LAB — URIC ACID: Uric Acid, Serum: 8.2 mg/dL — ABNORMAL HIGH (ref 4.0–7.8)

## 2019-07-05 MED ORDER — COLCHICINE 0.6 MG PO TABS: 0.6000 mg | ORAL_TABLET | Freq: Every day | ORAL | 0 refills | Status: DC | PRN

## 2019-07-05 MED ORDER — PREDNISONE 20 MG PO TABS: ORAL_TABLET | ORAL | 0 refills | Status: DC

## 2019-07-05 NOTE — Progress Notes (Signed)
This visit was conducted in person.  BP 130/82 (BP Location: Left Arm, Patient Position: Sitting, Cuff Size: Large)    Pulse 61    Temp 97.9 F (36.6 C) (Temporal)    Ht 5\' 8"  (1.727 m)    Wt 226 lb 4 oz (102.6 kg)    SpO2 97%    BMI 34.40 kg/m    CC: L toe pain Subjective:    Patient ID: Eric Huff, male    DOB: May 23, 1970, 49 y.o.   MRN: [id number]  HPI: Eric Huff is a 49 y.o. male presenting on 07/05/2019 for Foot Pain (C/o possible gout in left 5th toe.  Sxs started 07/01/19. )   5d h/o pain and swelling to left pinky toe. Treating with ice and cherry juice - but ongoing pain. Also treating with tylenol and ibuprofen with limited relief.   Denies inciting trauma, injury or fall.  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   No fevers/chills.  No known diet changes recently. Possible processed meat flared it.   Prior issue with podagra in the past.      Relevant past medical, surgical, family and social history reviewed and updated as indicated. Interim medical history since our last visit reviewed. Allergies and medications reviewed and updated. Outpatient Medications Prior to Visit  Medication Sig Dispense Refill   CALCIUM-MAGNESIUM-ZINC PO Take 3 tablets by mouth daily.     lisinopril (ZESTRIL) 20 MG tablet Take 1 tablet (20 mg total) by mouth daily. 90 tablet 3   loratadine (CLARITIN) 10 MG tablet Take 10 mg by mouth daily.     MISC NATURAL PRODUCTS PO Take by mouth daily. [employer] Extreme     MISC NATURAL PRODUCTS PO Take by mouth 4 (four) times daily - after meals and at bedtime. Testosterone Booster     Naproxen Sodium (ALEVE) 220 MG CAPS Take by mouth as needed.     No facility-administered medications prior to visit.     Per HPI unless specifically indicated in ROS section below Review of Systems Objective:  BP 130/82 (BP Location: Left Arm, Patient Position: Sitting, Cuff Size: Large)    Pulse 61    Temp 97.9 F (36.6 C)  (Temporal)    Ht 5\' 8"  (1.727 m)    Wt 226 lb 4 oz (102.6 kg)    SpO2 97%    BMI 34.40 kg/m   Wt Readings from Last 3 Encounters:  07/05/19 226 lb 4 oz (102.6 kg)  11/10/18 219 lb 1 oz (99.4 kg)  11/03/17 238 lb 8 oz (108.2 kg)      Physical Exam Vitals and nursing note reviewed.  Constitutional:      Appearance: Normal appearance. He is not ill-appearing.  Musculoskeletal:        General: Swelling and tenderness present. Normal range of motion.     Right lower leg: No edema.     Left lower leg: No edema.     Comments:  2+ DP bilaterally Swollen left great toe, tender at IP joint, limited ROM flexion/extension, no signifcant pain with axial loading  Skin:    General: Skin is warm and dry.     Findings: No erythema or rash.  Neurological:     Mental Status: He is alert.  Psychiatric:        Mood and Affect: Mood normal.        Behavior: Behavior normal.       Results for orders  placed or performed in visit on 11/07/18  Testos,Total,Free and SHBG (Male)  Result Value Ref Range   Testosterone, Total, LC-MS-MS 277 250 - 1,100 ng/dL   Free Testosterone 78.2 35.0 - 155.0 pg/mL   Sex Hormone Binding 28 10 - 50 nmol/L  PSA  Result Value Ref Range   PSA 0.78 0.10 - 4.00 ng/mL  Hemoglobin A1c  Result Value Ref Range   Hgb A1c MFr Bld 5.8 4.6 - 6.5 %  Comprehensive metabolic panel  Result Value Ref Range   Sodium 138 135 - 145 mEq/L   Potassium 4.7 3.5 - 5.1 mEq/L   Chloride 100 96 - 112 mEq/L   CO2 29 19 - 32 mEq/L   Glucose, Bld 93 70 - 99 mg/dL   BUN 22 6 - 23 mg/dL   Creatinine, Ser 9.56 0.40 - 1.50 mg/dL   Total Bilirubin 0.6 0.2 - 1.2 mg/dL   Alkaline Phosphatase 73 39 - 117 U/L   AST 25 0 - 37 U/L   ALT 21 0 - 53 U/L   Total Protein 6.9 6.0 - 8.3 g/dL   Albumin 4.4 3.5 - 5.2 g/dL   Calcium 9.8 8.4 - 21.3 mg/dL   GFR 08.65 (L) >78.46 mL/min  Lipid panel  Result Value Ref Range   Cholesterol 163 0 - 200 mg/dL   Triglycerides 962.9 0.0 - 149.0 mg/dL   HDL  52.84 >13.24 mg/dL   VLDL 40.1 0.0 - 02.7 mg/dL   LDL Cholesterol 96 0 - 99 mg/dL   Total CHOL/HDL Ratio 4    NonHDL 123.33    Assessment & Plan:  This visit occurred during the SARS-CoV-2 public health emergency.  Safety protocols were in place, including screening questions prior to the visit, additional usage of staff PPE, and extensive cleaning of exam room while observing appropriate contact time as indicated for disinfecting solutions.   Problem List Items Addressed This Visit    Acute idiopathic gout involving toe of left foot - Primary    Story/exam consistent with acute gout flare - treat with prednisone taper + colchicine PRN flares. Check UA, CBC, BMP today. Gout handout provided. Reviewed pathophysiology of gout. Update if not improving or if recurrent flares to consider daily ppx med.       Relevant Medications   predniSONE (DELTASONE) 20 MG tablet   colchicine 0.6 MG tablet   Other Relevant Orders   CBC with Differential/Platelet   Uric acid   Renal function panel       Meds ordered this encounter  Medications   predniSONE (DELTASONE) 20 MG tablet    Sig: Take two tablets daily for 3 days followed by one tablet daily for 4 days    Dispense:  10 tablet    Refill:  0   colchicine 0.6 MG tablet    Sig: Take 1 tablet (0.6 mg total) by mouth daily as needed. For gout flares, may take 2 tablets on the first day of flare    Dispense:  30 tablet    Refill:  0   Orders Placed This Encounter  Procedures   CBC with Differential/Platelet   Uric acid   Renal function panel    Patient instructions: I agree likely gout flare of pinky toe on left Treat with prednisone taper  May use colchicine 2 pills on first day followed by 1 pill on subsequent days for future gout flares.  Let us know if not improving with this, or if recurrent flares.  Labs  today.   Follow up plan: Return if symptoms worsen or fail to improve.  Eustaquio Boyden, MD

## 2019-07-05 NOTE — Assessment & Plan Note (Signed)
Story/exam consistent with acute gout flare - treat with prednisone taper + colchicine PRN flares. Check UA, CBC, BMP today. Gout handout provided. Reviewed pathophysiology of gout. Update if not improving or if recurrent flares to consider daily ppx med.

## 2019-07-05 NOTE — Patient Instructions (Addendum)
I agree likely gout flare of pinky toe on left Treat with prednisone taper  May use colchicine 2 pills on first day followed by 1 pill on subsequent days for future gout flares.  Let us know if not improving with this, or if recurrent flares.  Labs today.  Gout  Gout is a condition that causes painful swelling of the joints. Gout is a type of inflammation of the joints (arthritis). This condition is caused by having too much uric acid in the body. Uric acid is a chemical that forms when the body breaks down substances called purines. Purines are important for building body proteins. When the body has too much uric acid, sharp crystals can form and build up inside the joints. This causes pain and swelling. Gout attacks can happen quickly and may be very painful (acute gout). Over time, the attacks can affect more joints and become more frequent (chronic gout). Gout can also cause uric acid to build up under the skin and inside the kidneys. What are the causes? This condition is caused by too much uric acid in your blood. This can happen because:  Your kidneys do not remove enough uric acid from your blood. This is the most common cause.  Your body makes too much uric acid. This can happen with some cancers and cancer treatments. It can also occur if your body is breaking down too many red blood cells (hemolytic anemia).  You eat too many foods that are high in purines. These foods include organ meats and some seafood. Alcohol, especially beer, is also high in purines. A gout attack may be triggered by trauma or stress. What increases the risk? You are more likely to develop this condition if you:  Have a family history of gout.  Are male and middle-aged.  Are male and have gone through menopause.  Are obese.  Frequently drink alcohol, especially beer.  Are dehydrated.  Lose weight too quickly.  Have an organ transplant.  Have lead poisoning.  Take certain medicines, including  aspirin, cyclosporine, diuretics, levodopa, and niacin.  Have kidney disease.  Have a skin condition called psoriasis. What are the signs or symptoms? An attack of acute gout happens quickly. It usually occurs in just one joint. The most common place is the big toe. Attacks often start at night. Other joints that may be affected include joints of the feet, ankle, knee, fingers, wrist, or elbow. Symptoms of this condition may include:  Severe pain.  Warmth.  Swelling.  Stiffness.  Tenderness. The affected joint may be very painful to touch.  Shiny, red, or purple skin.  Chills and fever. Chronic gout may cause symptoms more frequently. More joints may be involved. You may also have white or yellow lumps (tophi) on your hands or feet or in other areas near your joints. How is this diagnosed? This condition is diagnosed based on your symptoms, medical history, and physical exam. You may have tests, such as:  Blood tests to measure uric acid levels.  Removal of joint fluid with a thin needle (aspiration) to look for uric acid crystals.  X-rays to look for joint damage. How is this treated? Treatment for this condition has two phases: treating an acute attack and preventing future attacks. Acute gout treatment may include medicines to reduce pain and swelling, including:  NSAIDs.  Steroids. These are strong anti-inflammatory medicines that can be taken by mouth (orally) or injected into a joint.  Colchicine. This medicine relieves pain and swelling when  it is taken soon after an attack. It can be given by mouth or through an IV. Preventive treatment may include:  Daily use of smaller doses of NSAIDs or colchicine.  Use of a medicine that reduces uric acid levels in your blood.  Changes to your diet. You may need to see a dietitian about what to eat and drink to prevent gout. Follow these instructions at home: During a gout attack   If directed, put ice on the affected  area: ? Put ice in a plastic bag. ? Place a towel between your skin and the bag. ? Leave the ice on for 20 minutes, 2-3 times a day.  Raise (elevate) the affected joint above the level of your heart as often as possible.  Rest the joint as much as possible. If the affected joint is in your leg, you may be given crutches to use.  Follow instructions from your health care provider about eating or drinking restrictions. Avoiding future gout attacks  Follow a low-purine diet as told by your dietitian or health care provider. Avoid foods and drinks that are high in purines, including liver, kidney, anchovies, asparagus, herring, mushrooms, mussels, and beer.  Maintain a healthy weight or lose weight if you are overweight. If you want to lose weight, talk with your health care provider. It is important that you do not lose weight too quickly.  Start or maintain an exercise program as told by your health care provider. Eating and drinking  Drink enough fluids to keep your urine pale yellow.  If you drink alcohol: ? Limit how much you use to:  0-1 drink a day for women.  0-2 drinks a day for men. ? Be aware of how much alcohol is in your drink. In the [place] one drink equals one 12 oz bottle of beer (355 mL) one 5 oz glass of wine (148 mL), or one 1 oz glass of hard liquor (44 mL). General instructions  Take over-the-counter and prescription medicines only as told by your health care provider.  Do not drive or use heavy machinery while taking prescription pain medicine.  Return to your normal activities as told by your health care provider. Ask your health care provider what activities are safe for you.  Keep all follow-up visits as told by your health care provider. This is important. Contact a health care provider if you have:  Another gout attack.  Continuing symptoms of a gout attack after 10 days of treatment.  Side effects from your medicines.  Chills or a fever.  Burning  pain when you urinate.  Pain in your lower back or belly. Get help right away if you:  Have severe or uncontrolled pain.  Cannot urinate. Summary  Gout is painful swelling of the joints caused by inflammation.  The most common site of pain is the big toe, but it can affect other joints in the body.  Medicines and dietary changes can help to prevent and treat gout attacks. This information is not intended to replace advice given to you by your health care provider. Make sure you discuss any questions you have with your health care provider. Document Revised: 08/04/2017 Document Reviewed: 08/04/2017 Elsevier Patient Education  [place].

## 2019-11-13 ENCOUNTER — Other Ambulatory Visit: Payer: BC Managed Care – PPO

## 2019-11-15 ENCOUNTER — Encounter: Payer: BC Managed Care – PPO | Admitting: Family Medicine

## 2019-12-14 ENCOUNTER — Other Ambulatory Visit: Payer: Self-pay | Admitting: Family Medicine

## 2019-12-14 DIAGNOSIS — Z125 Encounter for screening for malignant neoplasm of prostate: Secondary | ICD-10-CM

## 2019-12-14 DIAGNOSIS — R7303 Prediabetes: Secondary | ICD-10-CM

## 2019-12-14 DIAGNOSIS — M10072 Idiopathic gout, left ankle and foot: Secondary | ICD-10-CM

## 2019-12-14 DIAGNOSIS — Z1159 Encounter for screening for other viral diseases: Secondary | ICD-10-CM

## 2019-12-14 DIAGNOSIS — E785 Hyperlipidemia, unspecified: Secondary | ICD-10-CM

## 2019-12-14 DIAGNOSIS — E349 Endocrine disorder, unspecified: Secondary | ICD-10-CM

## 2019-12-15 ENCOUNTER — Other Ambulatory Visit: Payer: Self-pay

## 2019-12-15 ENCOUNTER — Other Ambulatory Visit (INDEPENDENT_AMBULATORY_CARE_PROVIDER_SITE_OTHER): Payer: BC Managed Care – PPO

## 2019-12-15 DIAGNOSIS — R7303 Prediabetes: Secondary | ICD-10-CM

## 2019-12-15 DIAGNOSIS — Z125 Encounter for screening for malignant neoplasm of prostate: Secondary | ICD-10-CM

## 2019-12-15 DIAGNOSIS — E349 Endocrine disorder, unspecified: Secondary | ICD-10-CM

## 2019-12-15 DIAGNOSIS — E785 Hyperlipidemia, unspecified: Secondary | ICD-10-CM

## 2019-12-15 DIAGNOSIS — Z1159 Encounter for screening for other viral diseases: Secondary | ICD-10-CM | POA: Diagnosis not present

## 2019-12-15 DIAGNOSIS — M10072 Idiopathic gout, left ankle and foot: Secondary | ICD-10-CM

## 2019-12-15 LAB — COMPREHENSIVE METABOLIC PANEL
ALT: 29 U/L (ref 0–53)
AST: 28 U/L (ref 0–37)
Albumin: 4.2 g/dL (ref 3.5–5.2)
Alkaline Phosphatase: 77 U/L (ref 39–117)
BUN: 17 mg/dL (ref 6–23)
CO2: 27 mEq/L (ref 19–32)
Calcium: 9.4 mg/dL (ref 8.4–10.5)
Chloride: 104 mEq/L (ref 96–112)
Creatinine, Ser: 1.06 mg/dL (ref 0.40–1.50)
GFR: 81.35 mL/min (ref >60.00)
Glucose, Bld: 98 mg/dL (ref 70–99)
Potassium: 4.5 mEq/L (ref 3.5–5.1)
Sodium: 139 mEq/L (ref 135–145)
Total Bilirubin: 0.7 mg/dL (ref 0.2–1.2)
Total Protein: 6.7 g/dL (ref 6.0–8.3)

## 2019-12-15 LAB — PSA: PSA: 0.91 ng/mL (ref 0.10–4.00)

## 2019-12-15 LAB — LIPID PANEL
Cholesterol: 169 mg/dL (ref 0–200)
HDL: 42.1 mg/dL (ref >39.00)
LDL Cholesterol: 106 mg/dL — ABNORMAL HIGH (ref 0–99)
NonHDL: 127.06
Total CHOL/HDL Ratio: 4
Triglycerides: 107 mg/dL (ref 0.0–149.0)
VLDL: 21.4 mg/dL (ref 0.0–40.0)

## 2019-12-15 LAB — URIC ACID: Uric Acid, Serum: 9 mg/dL — ABNORMAL HIGH (ref 4.0–7.8)

## 2019-12-15 LAB — HEMOGLOBIN A1C: Hgb A1c MFr Bld: 5.7 % (ref 4.6–6.5)

## 2019-12-19 LAB — TESTOS,TOTAL,FREE AND SHBG (FEMALE)
Free Testosterone: 88 pg/mL (ref 35.0–155.0)
Sex Hormone Binding: 27 nmol/L (ref 10–50)
Testosterone, Total, LC-MS-MS: 421 ng/dL (ref 250–1100)

## 2019-12-19 LAB — HEPATITIS C ANTIBODY
Hepatitis C Ab: NONREACTIVE
SIGNAL TO CUT-OFF: 0.01 (ref <1.00)

## 2019-12-20 ENCOUNTER — Encounter: Payer: Self-pay | Admitting: Family Medicine

## 2019-12-20 ENCOUNTER — Other Ambulatory Visit: Payer: Self-pay | Admitting: Family Medicine

## 2019-12-20 ENCOUNTER — Ambulatory Visit (INDEPENDENT_AMBULATORY_CARE_PROVIDER_SITE_OTHER): Payer: BC Managed Care – PPO | Admitting: Family Medicine

## 2019-12-20 ENCOUNTER — Other Ambulatory Visit: Payer: Self-pay

## 2019-12-20 VITALS — BP 120/72 | HR 54 | Temp 98.0°F | Ht 68.5 in | Wt 228.0 lb

## 2019-12-20 DIAGNOSIS — E559 Vitamin D deficiency, unspecified: Secondary | ICD-10-CM | POA: Insufficient documentation

## 2019-12-20 DIAGNOSIS — Z0000 Encounter for general adult medical examination without abnormal findings: Secondary | ICD-10-CM

## 2019-12-20 DIAGNOSIS — E349 Endocrine disorder, unspecified: Secondary | ICD-10-CM

## 2019-12-20 DIAGNOSIS — I10 Essential (primary) hypertension: Secondary | ICD-10-CM

## 2019-12-20 DIAGNOSIS — E66811 Obesity, class 1: Secondary | ICD-10-CM

## 2019-12-20 DIAGNOSIS — M1A0720 Idiopathic chronic gout, left ankle and foot, without tophus (tophi): Secondary | ICD-10-CM

## 2019-12-20 DIAGNOSIS — E785 Hyperlipidemia, unspecified: Secondary | ICD-10-CM

## 2019-12-20 DIAGNOSIS — Z1211 Encounter for screening for malignant neoplasm of colon: Secondary | ICD-10-CM

## 2019-12-20 DIAGNOSIS — R7303 Prediabetes: Secondary | ICD-10-CM

## 2019-12-20 DIAGNOSIS — E669 Obesity, unspecified: Secondary | ICD-10-CM | POA: Diagnosis not present

## 2019-12-20 LAB — VITAMIN D 25 HYDROXY (VIT D DEFICIENCY, FRACTURES): VITD: 19.8 ng/mL — ABNORMAL LOW (ref 30.00–100.00)

## 2019-12-20 MED ORDER — LISINOPRIL 10 MG PO TABS
10.0000 mg | ORAL_TABLET | Freq: Every day | ORAL | 3 refills | Status: DC
Start: 2019-12-20 — End: 2020-12-24

## 2019-12-20 MED ORDER — VITAMIN D3 1.25 MG (50000 UT) PO TABS: 1.0000 | ORAL_TABLET | ORAL | 0 refills | Status: DC

## 2019-12-20 NOTE — Assessment & Plan Note (Signed)
Preventative protocols reviewed and updated unless pt declined. Discussed healthy diet and lifestyle.  

## 2019-12-20 NOTE — Assessment & Plan Note (Addendum)
Chronic, mild, stable of statin. The 10-year ASCVD risk score Denman George DC Montez Hageman., et al., 2013) is: 3.8%   Values used to calculate the score:     Age: 49 years     Sex: Male     Is Non-Hispanic African American: No     Diabetic: No     Tobacco smoker: No     Systolic Blood Pressure: 120 mmHg     Is BP treated: Yes     HDL Cholesterol: 42.1 mg/dL     Total Cholesterol: 169 mg/dL

## 2019-12-20 NOTE — Assessment & Plan Note (Signed)
H/o L podagra. Urate levels elevated however with infrequent flares likely not good candidate for daily preventative medicine. Reviewed low purine diet.

## 2019-12-20 NOTE — Assessment & Plan Note (Addendum)
Chronic, stable on lisinopril 10mg  daily. Continue this. Cr stable (bumped while on 20mg  dose). Encouraged good water intake.

## 2019-12-20 NOTE — Progress Notes (Signed)
Patient ID: Eric Huff, male    DOB: Jun 01, 1970, 49 y.o.   MRN: [id number]  This visit was conducted in person.  BP 120/72 (BP Location: Left Arm, Patient Position: Sitting, Cuff Size: Large)   Pulse (!) 54   Temp 98 F (36.7 C) (Temporal)   Ht 5' 8.5" (1.74 m)   Wt 228 lb (103.4 kg)   SpO2 98%   BMI 34.16 kg/m    CC: CPE Subjective:   HPI: Eric Huff is a 49 y.o. male presenting on 12/20/2019 for Annual Exam (Wants to discuss lisinopril. )   Last year we dropped lisinopril to 17m dose after weight loss - doing well with this.   Preventative: Colon cancer screening - discussed, would like iFOB. Prostate cancer - discussed, denies prostate symptoms. H/o prostatitis. Continues yearly PSA.  Flu shot - declines  Tetanus shot - 2005. Tdap 10/2018 COVID vaccine - declines Shingrix - discussed, would consider  Seat belt use discussed  Sunscreen use discussed, no changing moles on skin.  Ex smoker, quit chewing as well. Alcohol -rarely  Dentist Q6 mo Eye exam - last year   Lives with wife, youngest daughter, 2 dogs, bearded dragon Occupation: [employer] Activity: no regular exercise - physical at job  Diet: good water, fruits/vegetables daily     Relevant past medical, surgical, family and social history reviewed and updated as indicated. Interim medical history since our last visit reviewed. Allergies and medications reviewed and updated. Outpatient Medications Prior to Visit  Medication Sig Dispense Refill  . CALCIUM-MAGNESIUM-ZINC PO Take 3 tablets by mouth daily.    . colchicine 0.6 MG tablet Take 1 tablet (0.6 mg total) by mouth daily as needed. For gout flares, may take 2 tablets on the first day of flare 30 tablet 0  . loratadine (CLARITIN) 10 MG tablet Take 10 mg by mouth daily.    . Naproxen Sodium (ALEVE) 220 MG CAPS Take by mouth as needed.    .Marland Kitchenlisinopril (ZESTRIL) 20 MG tablet Take 1 tablet (20 mg total) by mouth  daily. 90 tablet 3  . MISC NATURAL PRODUCTS PO Take by mouth daily. [employer]reme    . MISC NATURAL PRODUCTS PO Take by mouth 4 (four) times daily - after meals and at bedtime. Testosterone Booster    . predniSONE (DELTASONE) 20 MG tablet Take two tablets daily for 3 days followed by one tablet daily for 4 days 10 tablet 0   No facility-administered medications prior to visit.     Per HPI unless specifically indicated in ROS section below Review of Systems  Constitutional: Negative for activity change, appetite change, chills, fatigue, fever and unexpected weight change.  HENT: Negative for hearing loss.   Eyes: Negative for visual disturbance.  Respiratory: Negative for cough, chest tightness, shortness of breath and wheezing.   Cardiovascular: Negative for chest pain, palpitations and leg swelling.  Gastrointestinal: Negative for abdominal distention, abdominal pain, blood in stool, constipation, diarrhea, nausea and vomiting.  Genitourinary: Negative for difficulty urinating and hematuria.  Musculoskeletal: Negative for arthralgias, myalgias and neck pain.  Skin: Negative for rash.  Neurological: Negative for dizziness, seizures, syncope and headaches.  Hematological: Negative for adenopathy. Does not bruise/bleed easily.  Psychiatric/Behavioral: Negative for dysphoric mood. The patient is not nervous/anxious.    Objective:  BP 120/72 (BP Location: Left Arm, Patient Position: Sitting, Cuff Size: Large)   Pulse (!) 54   Temp 98 F (36.7 C) (Temporal)  Ht 5' 8.5" (1.74 m)   Wt 228 lb (103.4 kg)   SpO2 98%   BMI 34.16 kg/m   Wt Readings from Last 3 Encounters:  12/20/19 228 lb (103.4 kg)  07/05/19 226 lb 4 oz (102.6 kg)  11/10/18 219 lb 1 oz (99.4 kg)      Physical Exam Vitals and nursing note reviewed.  Constitutional:      General: He is not in acute distress.    Appearance: Normal appearance. He is well-developed. He is not ill-appearing.  HENT:     Head:  Normocephalic and atraumatic.     Right Ear: Hearing, tympanic membrane, ear canal and external ear normal.     Left Ear: Hearing, tympanic membrane, ear canal and external ear normal.  Eyes:     General: No scleral icterus.    Extraocular Movements: Extraocular movements intact.     Conjunctiva/sclera: Conjunctivae normal.     Pupils: Pupils are equal, round, and reactive to light.  Neck:     Thyroid: No thyroid mass or thyromegaly.  Cardiovascular:     Rate and Rhythm: Normal rate and regular rhythm.     Pulses: Normal pulses.          Radial pulses are 2+ on the right side and 2+ on the left side.     Heart sounds: Normal heart sounds. No murmur heard.   Pulmonary:     Effort: Pulmonary effort is normal. No respiratory distress.     Breath sounds: Normal breath sounds. No wheezing, rhonchi or rales.  Abdominal:     General: Abdomen is flat. Bowel sounds are normal. There is no distension.     Palpations: Abdomen is soft. There is no mass.     Tenderness: There is no abdominal tenderness. There is no guarding or rebound.     Hernia: No hernia is present.  Musculoskeletal:        General: Normal range of motion.     Cervical back: Normal range of motion and neck supple.     Right lower leg: No edema.     Left lower leg: No edema.  Lymphadenopathy:     Cervical: No cervical adenopathy.  Skin:    General: Skin is warm and dry.     Findings: No rash.  Neurological:     General: No focal deficit present.     Mental Status: He is alert and oriented to person, place, and time.     Comments: CN grossly intact, station and gait intact  Psychiatric:        Mood and Affect: Mood normal.        Behavior: Behavior normal.        Thought Content: Thought content normal.        Judgment: Judgment normal.       Results for orders placed or performed in visit on 12/15/19  Uric acid  Result Value Ref Range   Uric Acid, Serum 9.0 (H) 4.0 - 7.8 mg/dL  Hepatitis C antibody  Result  Value Ref Range   Hepatitis C Ab NON-REACTIVE NON-REACTI   SIGNAL TO CUT-OFF 0.01 <1.00  Testos,Total,Free and SHBG (Male)  Result Value Ref Range   Testosterone, Total, LC-MS-MS 421 250 - 1,100 ng/dL   Free Testosterone 88.0 35.0 - 155.0 pg/mL   Sex Hormone Binding 27 10 - 50 nmol/L  PSA  Result Value Ref Range   PSA 0.91 0.10 - 4.00 ng/mL  Hemoglobin A1c  Result Value Ref Range  Hgb A1c MFr Bld 5.7 4.6 - 6.5 %  Comprehensive metabolic panel  Result Value Ref Range   Sodium 139 135 - 145 mEq/L   Potassium 4.5 3.5 - 5.1 mEq/L   Chloride 104 96 - 112 mEq/L   CO2 27 19 - 32 mEq/L   Glucose, Bld 98 70 - 99 mg/dL   BUN 17 6 - 23 mg/dL   Creatinine, Ser 1.06 0.40 - 1.50 mg/dL   Total Bilirubin 0.7 0.2 - 1.2 mg/dL   Alkaline Phosphatase 77 39 - 117 U/L   AST 28 0 - 37 U/L   ALT 29 0 - 53 U/L   Total Protein 6.7 6.0 - 8.3 g/dL   Albumin 4.2 3.5 - 5.2 g/dL   GFR 81.35 >60.00 mL/min   Calcium 9.4 8.4 - 10.5 mg/dL  Lipid panel  Result Value Ref Range   Cholesterol 169 0 - 200 mg/dL   Triglycerides 107.0 0 - 149 mg/dL   HDL 42.10 >39.00 mg/dL   VLDL 21.4 0.0 - 40.0 mg/dL   LDL Cholesterol 106 (H) 0 - 99 mg/dL   Total CHOL/HDL Ratio 4    NonHDL 127.06    Assessment & Plan:  This visit occurred during the SARS-CoV-2 public health emergency.  Safety protocols were in place, including screening questions prior to the visit, additional usage of staff PPE, and extensive cleaning of exam room while observing appropriate contact time as indicated for disinfecting solutions.   Problem List Items Addressed This Visit    Prediabetes    Mild. Limit added sugars.       Obesity, Class I, BMI 30.0-34.9 (see actual BMI)    Encouraged healthy diet and lifestyle changes to affect sustainable weight loss.       Hypotestosteronism    Levels have normalized. Overall feeling well - will stop checking.       Health maintenance examination - Primary    Preventative protocols reviewed and  updated unless pt declined. Discussed healthy diet and lifestyle.       Relevant Orders   VITAMIN D 25 Hydroxy (Vit-D Deficiency, Fractures)   Essential hypertension    Chronic, stable on lisinopril 40m daily. Continue this. Cr stable (bumped while on 215mdose). Encouraged good water intake.       Relevant Medications   lisinopril (ZESTRIL) 10 MG tablet   Dyslipidemia    Chronic, mild, stable of statin. The 10-year ASCVD risk score (GMikey BussingC JrBrooke Bonito et al., 2013) is: 3.8%   Values used to calculate the score:     Age: [age]     Sex: Male     Is Non-Hispanic African American: No     Diabetic: No     Tobacco smoker: No     Systolic Blood Pressure: 12937mHg     Is BP treated: Yes     HDL Cholesterol: 42.1 mg/dL     Total Cholesterol: 169 mg/dL       Chronic gout    H/o L podagra. Urate levels elevated however with infrequent flares likely not good candidate for daily preventative medicine. Reviewed low purine diet.        Other Visit Diagnoses    Special screening for malignant neoplasms, colon       Relevant Orders   Fecal occult blood, imunochemical       Meds ordered this encounter  Medications  . lisinopril (ZESTRIL) 10 MG tablet    Sig: Take 1 tablet (10 mg total) by mouth  daily.    Dispense:  90 tablet    Refill:  3   Orders Placed This Encounter  Procedures  . Fecal occult blood, imunochemical    Standing Status:   Future    Standing Expiration Date:   12/19/2020  . VITAMIN D 25 Hydroxy (Vit-D Deficiency, Fractures)    Patient instructions: Pass by lab to pick up stool kit. Vitamin D check today.  Continue lisinopril 55m daily.  Consider 2 shot shingrix vaccine. Let [place] if interested.  You are doing well today Return as needed or in 1 year for next physical. Let [place] if frequent gout flares to consider daily medicine.   Follow up plan: Return in about 1 year (around 12/19/2020) for annual exam, prior fasting for blood work.  JRia Bush MD

## 2019-12-20 NOTE — Patient Instructions (Addendum)
Pass by lab to pick up stool kit. Vitamin D check today.  Continue lisinopril 49m daily.  Consider 2 shot shingrix vaccine. Let [facility] if interested.  You are doing well today Return as needed or in 1 year for next physical. Let [facility] if frequent gout flares to consider daily medicine.   Health Maintenance, Male Adopting a healthy lifestyle and getting preventive care are important in promoting health and wellness. Ask your health care provider about:  The right schedule for you to have regular tests and exams.  Things you can do on your own to prevent diseases and keep yourself healthy. What should I know about diet, weight, and exercise? Eat a healthy diet   Eat a diet that includes plenty of vegetables, fruits, low-fat dairy products, and lean protein.  Do not eat a lot of foods that are high in solid fats, added sugars, or sodium. Maintain a healthy weight Body mass index (BMI) is a measurement that can be used to identify possible weight problems. It estimates body fat based on height and weight. Your health care provider can help determine your BMI and help you achieve or maintain a healthy weight. Get regular exercise Get regular exercise. This is one of the most important things you can do for your health. Most adults should:  Exercise for at least 150 minutes each week. The exercise should increase your heart rate and make you sweat (moderate-intensity exercise).  Do strengthening exercises at least twice a week. This is in addition to the moderate-intensity exercise.  Spend less time sitting. Even light physical activity can be beneficial. Watch cholesterol and blood lipids Have your blood tested for lipids and cholesterol at 51years of age, then have this test every 5 years. You may need to have your cholesterol levels checked more often if:  Your lipid or cholesterol levels are high.  You are older than 51years of age.  You are at high risk for heart  disease. What should I know about cancer screening? Many types of cancers can be detected early and may often be prevented. Depending on your health history and family history, you may need to have cancer screening at various ages. This may include screening for:  Colorectal cancer.  Prostate cancer.  Skin cancer.  Lung cancer. What should I know about heart disease, diabetes, and high blood pressure? Blood pressure and heart disease  High blood pressure causes heart disease and increases the risk of stroke. This is more likely to develop in people who have high blood pressure readings, are of African descent, or are overweight.  Talk with your health care provider about your target blood pressure readings.  Have your blood pressure checked: ? Every 3-5 years if you are 121378years of age. ? Every year if you are 458years old or older.  If you are between the ages of 653and 743and are a current or former smoker, ask your health care provider if you should have a one-time screening for abdominal aortic aneurysm (AAA). Diabetes Have regular diabetes screenings. This checks your fasting blood sugar level. Have the screening done:  Once every three years after age 5138if you are at a normal weight and have a low risk for diabetes.  More often and at a younger age if you are overweight or have a high risk for diabetes. What should I know about preventing infection? Hepatitis B If you have a higher risk for hepatitis B, you should  be screened for this virus. Talk with your health care provider to find out if you are at risk for hepatitis B infection. Hepatitis C Blood testing is recommended for:  Everyone born from [birth year] through 1967.  Anyone with known risk factors for hepatitis C. Sexually transmitted infections (STIs)  You should be screened each year for STIs, including gonorrhea and chlamydia, if: ? You are sexually active and are younger than 51 years of age. ? You are older  than 51 years of age and your health care provider tells you that you are at risk for this type of infection. ? Your sexual activity has changed since you were last screened, and you are at increased risk for chlamydia or gonorrhea. Ask your health care provider if you are at risk.  Ask your health care provider about whether you are at high risk for HIV. Your health care provider may recommend a prescription medicine to help prevent HIV infection. If you choose to take medicine to prevent HIV, you should first get tested for HIV. You should then be tested every 3 months for as long as you are taking the medicine. Follow these instructions at home: Lifestyle  Do not use any products that contain nicotine or tobacco, such as cigarettes, e-cigarettes, and chewing tobacco. If you need help quitting, ask your health care provider.  Do not use street drugs.  Do not share needles.  Ask your health care provider for help if you need support or information about quitting drugs. Alcohol use  Do not drink alcohol if your health care provider tells you not to drink.  If you drink alcohol: ? Limit how much you have to 0-2 drinks a day. ? Be aware of how much alcohol is in your drink. In the [place] one drink equals one 12 oz bottle of beer (355 mL), one 5 oz glass of wine (148 mL), or one 1 oz glass of hard liquor (44 mL). General instructions  Schedule regular health, dental, and eye exams.  Stay current with your vaccines.  Tell your health care provider if: ? You often feel depressed. ? You have ever been abused or do not feel safe at home. Summary  Adopting a healthy lifestyle and getting preventive care are important in promoting health and wellness.  Follow your health care provider's instructions about healthy diet, exercising, and getting tested or screened for diseases.  Follow your health care provider's instructions on monitoring your cholesterol and blood pressure. This  information is not intended to replace advice given to you by your health care provider. Make sure you discuss any questions you have with your health care provider. Document Revised: 01/05/2018 Document Reviewed: 01/05/2018 Elsevier Patient Education  2020 [employer].

## 2019-12-20 NOTE — Assessment & Plan Note (Signed)
Encouraged healthy diet and lifestyle changes to affect sustainable weight loss.  

## 2019-12-20 NOTE — Assessment & Plan Note (Signed)
Mild. Limit added sugars.

## 2019-12-20 NOTE — Assessment & Plan Note (Signed)
Levels have normalized. Overall feeling well - will stop checking.

## 2019-12-26 ENCOUNTER — Other Ambulatory Visit (INDEPENDENT_AMBULATORY_CARE_PROVIDER_SITE_OTHER): Payer: BC Managed Care – PPO

## 2019-12-26 DIAGNOSIS — Z1211 Encounter for screening for malignant neoplasm of colon: Secondary | ICD-10-CM

## 2019-12-26 LAB — FECAL OCCULT BLOOD, IMMUNOCHEMICAL: Fecal Occult Bld: NEGATIVE

## 2019-12-26 LAB — FECAL OCCULT BLOOD, GUAIAC: Fecal Occult Blood: NEGATIVE

## 2019-12-27 ENCOUNTER — Encounter: Payer: Self-pay | Admitting: Family Medicine

## 2020-01-09 DIAGNOSIS — M9906 Segmental and somatic dysfunction of lower extremity: Secondary | ICD-10-CM | POA: Diagnosis not present

## 2020-01-09 DIAGNOSIS — M4607 Spinal enthesopathy, lumbosacral region: Secondary | ICD-10-CM | POA: Diagnosis not present

## 2020-01-09 DIAGNOSIS — M9904 Segmental and somatic dysfunction of sacral region: Secondary | ICD-10-CM | POA: Diagnosis not present

## 2020-01-09 DIAGNOSIS — M9903 Segmental and somatic dysfunction of lumbar region: Secondary | ICD-10-CM | POA: Diagnosis not present

## 2020-01-11 DIAGNOSIS — M4607 Spinal enthesopathy, lumbosacral region: Secondary | ICD-10-CM | POA: Diagnosis not present

## 2020-01-11 DIAGNOSIS — M9903 Segmental and somatic dysfunction of lumbar region: Secondary | ICD-10-CM | POA: Diagnosis not present

## 2020-01-11 DIAGNOSIS — M9906 Segmental and somatic dysfunction of lower extremity: Secondary | ICD-10-CM | POA: Diagnosis not present

## 2020-01-11 DIAGNOSIS — M9904 Segmental and somatic dysfunction of sacral region: Secondary | ICD-10-CM | POA: Diagnosis not present

## 2020-02-09 ENCOUNTER — Ambulatory Visit: Payer: BC Managed Care – PPO | Admitting: Family Medicine

## 2020-02-09 ENCOUNTER — Other Ambulatory Visit: Payer: Self-pay

## 2020-02-09 ENCOUNTER — Encounter: Payer: Self-pay | Admitting: Family Medicine

## 2020-02-09 VITALS — BP 138/80 | HR 59 | Temp 97.9°F | Ht 68.5 in | Wt 231.2 lb

## 2020-02-09 DIAGNOSIS — R21 Rash and other nonspecific skin eruption: Secondary | ICD-10-CM

## 2020-02-09 DIAGNOSIS — B354 Tinea corporis: Secondary | ICD-10-CM

## 2020-02-09 LAB — POCT SKIN KOH: Skin KOH, POC: POSITIVE — AB

## 2020-02-09 MED ORDER — FLUCONAZOLE 150 MG PO TABS: 150.0000 mg | ORAL_TABLET | ORAL | 0 refills | Status: DC

## 2020-02-09 NOTE — Patient Instructions (Addendum)
I think you have fungal skin infection like ringworm that has spread.  Treat with antifungal sent to pharmacy.  Also may use topical lotrimin antifungal for more symptomatic rash.  Let us know if not improving with treatment.   Body Ringworm Body ringworm is an infection of the skin that often causes a ring-shaped rash. Body ringworm is also called tinea corporis. Body ringworm can affect any part of your skin. This condition is easily spread from person to person (is very contagious). What are the causes? This condition is caused by fungi called dermatophytes. The condition develops when these fungi grow out of control on the skin. You can get this condition if you touch a person or animal that has it. You can also get it if you share any items with an infected person or pet. These include:  Clothing, bedding, and towels.  Brushes or combs.  Gym equipment.  Any other object that has the fungus on it. What increases the risk? You are more likely to develop this condition if you:  Play sports that involve close physical contact, such as wrestling.  Sweat a lot.  Live in areas that are hot and humid.  Use public showers.  Have a weakened immune system. What are the signs or symptoms? Symptoms of this condition include:  Itchy, raised red spots and bumps.  Red scaly patches.  A ring-shaped rash. The rash may have: ? A clear center. ? Scales or red bumps at its center. ? Redness near its borders. ? Dry and scaly skin on or around it.   How is this diagnosed? This condition can usually be diagnosed with a skin exam. A skin scraping may be taken from the affected area and examined under a microscope to see if the fungus is present. How is this treated? This condition may be treated with:  An antifungal cream or ointment.  An antifungal shampoo.  Antifungal medicines. These may be prescribed if your ringworm: ? Is severe. ? Keeps coming back. ? Lasts a long  time. Follow these instructions at home:  Take over-the-counter and prescription medicines only as told by your health care provider.  If you were given an antifungal cream or ointment: ? Use it as told by your health care provider. ? Wash the infected area and dry it completely before applying the cream or ointment.  If you were given an antifungal shampoo: ? Use it as told by your health care provider. ? Leave the shampoo on your body for 3-5 minutes before rinsing.  While you have a rash: ? Wear loose clothing to stop clothes from rubbing and irritating it. ? Wash or change your bed sheets every night. ? Disinfect or throw out items that may be infected. ? Wash clothes and bed sheets in hot water. ? Wash your hands often with soap and water. If soap and water are not available, use hand sanitizer.  If your pet has the same infection, take your pet to see a veterinarian for treatment. How is this prevented?  Take a bath or shower every day and after every time you work out or play sports.  Dry your skin completely after bathing.  Wear sandals or shoes in public places and showers.  Change your clothes every day.  Wash athletic clothes after each use.  Do not share personal items with others.  Avoid touching red patches of skin on other people.  Avoid touching pets that have bald spots.  If you touch an animal  that has a bald spot, wash your hands. Contact a health care provider if:  Your rash continues to spread after 7 days of treatment.  Your rash is not gone in 4 weeks.  The area around your rash gets red, warm, tender, and swollen. Summary  Body ringworm is an infection of the skin that often causes a ring-shaped rash.  This condition is easily spread from person to person (is very contagious).  This condition may be treated with antifungal cream or ointment, antifungal shampoo, or antifungal medicines.  Take over-the-counter and prescription medicines  only as told by your health care provider. This information is not intended to replace advice given to you by your health care provider. Make sure you discuss any questions you have with your health care provider. Document Revised: 09/10/2017 Document Reviewed: 09/10/2017 Elsevier Patient Education  2021 [employer].

## 2020-02-09 NOTE — Progress Notes (Signed)
Patient ID: Eric Huff, male    DOB: 10-05-1970, 50 y.o.   MRN: [id number]  This visit was conducted in person.  BP 138/80 (BP Location: Left Arm, Patient Position: Sitting, Cuff Size: Large)   Pulse (!) 59   Temp 97.9 F (36.6 C) (Temporal)   Ht 5' 8.5" (1.74 m)   Wt 231 lb 4 oz (104.9 kg)   SpO2 98%   BMI 34.65 kg/m    CC: rash  Subjective:   HPI: Eric Huff is a 50 y.o. male presenting on 02/09/2020 for Rash (C/o rash on right side of lower back down right leg.  Area burns.  Noticed 2 days ago. )   2d h/o red raised rash to R lower back down R posterior leg. Burning discomfort. No paresthesias or numbness. No noted blisters.  Treating with desitin and antifungal foot spray.  Seems to be getting worse.  No h/o DM      Relevant past medical, surgical, family and social history reviewed and updated as indicated. Interim medical history since our last visit reviewed. Allergies and medications reviewed and updated. Outpatient Medications Prior to Visit  Medication Sig Dispense Refill  . CALCIUM-MAGNESIUM-ZINC PO Take 3 tablets by mouth daily.    . Cholecalciferol (VITAMIN D3) 1.25 MG (50000 UT) TABS Take 1 tablet by mouth once a week. 12 tablet 0  . colchicine 0.6 MG tablet Take 1 tablet (0.6 mg total) by mouth daily as needed. For gout flares, may take 2 tablets on the first day of flare 30 tablet 0  . lisinopril (ZESTRIL) 10 MG tablet Take 1 tablet (10 mg total) by mouth daily. 90 tablet 3  . loratadine (CLARITIN) 10 MG tablet Take 10 mg by mouth daily.    . Naproxen Sodium 220 MG CAPS Take by mouth as needed.     No facility-administered medications prior to visit.     Per HPI unless specifically indicated in ROS section below Review of Systems Objective:  BP 138/80 (BP Location: Left Arm, Patient Position: Sitting, Cuff Size: Large)   Pulse (!) 59   Temp 97.9 F (36.6 C) (Temporal)   Ht 5' 8.5" (1.74 m)   Wt 231 lb 4 oz (104.9 kg)   SpO2 98%   BMI  34.65 kg/m   Wt Readings from Last 3 Encounters:  02/09/20 231 lb 4 oz (104.9 kg)  12/20/19 228 lb (103.4 kg)  07/05/19 226 lb 4 oz (102.6 kg)      Physical Exam Vitals and nursing note reviewed.  Constitutional:      Appearance: Normal appearance. He is not ill-appearing.  Skin:    General: Skin is warm and dry.     Findings: Erythema (faint) and rash present.          Comments: Disseminated scaly patches to left lower back and down left leg. Skin scraping collected for KOH.   Neurological:     Mental Status: He is alert.       Results for orders placed or performed in visit on 02/09/20  POCT Skin KOH  Result Value Ref Range   Skin KOH, POC Positive (A) Negative   Assessment & Plan:  This visit occurred during the SARS-CoV-2 public health emergency.  Safety protocols were in place, including screening questions prior to the visit, additional usage of staff PPE, and extensive cleaning of exam room while observing appropriate contact time as indicated for disinfecting solutions.   Problem List Items Addressed This Visit  Tinea corporis - Primary    KOH positive.  Anticipate disseminated tinea corporis.  Treat with diflucan oral + lotrimin topically.  Update if not improving with treatment.  Not consistent with shingles - not dermatomal, not vesicular rash.      Relevant Medications   fluconazole (DIFLUCAN) 150 MG tablet    Other Visit Diagnoses    Skin rash       Relevant Orders   POCT Skin KOH (Completed)       Meds ordered this encounter  Medications  . fluconazole (DIFLUCAN) 150 MG tablet    Sig: Take 1 tablet (150 mg total) by mouth once a week.    Dispense:  4 tablet    Refill:  0    Don't take colchicine while on this medication   Orders Placed This Encounter  Procedures  . POCT Skin KOH    Patient instructions: I think you have fungal skin infection like ringworm that has spread.  Treat with antifungal sent to pharmacy.  Also may use topical  lotrimin antifungal for more symptomatic rash.  Let us know if not improving with treatment.   Follow up plan: Return if symptoms worsen or fail to improve.  Eustaquio Boyden, MD

## 2020-02-09 NOTE — Assessment & Plan Note (Signed)
KOH positive.  Anticipate disseminated tinea corporis.  Treat with diflucan oral + lotrimin topically.  Update if not improving with treatment.  Not consistent with shingles - not dermatomal, not vesicular rash.

## 2020-05-16 ENCOUNTER — Other Ambulatory Visit: Payer: Self-pay

## 2020-05-16 ENCOUNTER — Ambulatory Visit: Payer: BC Managed Care – PPO | Admitting: Family Medicine

## 2020-05-16 VITALS — BP 130/90 | HR 67 | Temp 97.8°F | Ht 69.0 in | Wt 235.5 lb

## 2020-05-16 DIAGNOSIS — R002 Palpitations: Secondary | ICD-10-CM | POA: Diagnosis not present

## 2020-05-16 DIAGNOSIS — R109 Unspecified abdominal pain: Secondary | ICD-10-CM

## 2020-05-16 DIAGNOSIS — E559 Vitamin D deficiency, unspecified: Secondary | ICD-10-CM

## 2020-05-16 DIAGNOSIS — N200 Calculus of kidney: Secondary | ICD-10-CM | POA: Diagnosis not present

## 2020-05-16 LAB — POC URINALSYSI DIPSTICK (AUTOMATED)
Bilirubin, UA: NEGATIVE
Blood, UA: NEGATIVE
Glucose, UA: NEGATIVE
Ketones, UA: NEGATIVE
Leukocytes, UA: NEGATIVE
Nitrite, UA: NEGATIVE
Protein, UA: NEGATIVE
Spec Grav, UA: 1.025 (ref 1.010–1.025)
Urobilinogen, UA: 0.2 E.U./dL
pH, UA: 5.5 (ref 5.0–8.0)

## 2020-05-16 LAB — VITAMIN D 25 HYDROXY (VIT D DEFICIENCY, FRACTURES): VITD: 31.84 ng/mL (ref 30.00–100.00)

## 2020-05-16 MED ORDER — TAMSULOSIN HCL 0.4 MG PO CAPS: 0.4000 mg | ORAL_CAPSULE | Freq: Every day | ORAL | 0 refills | Status: DC

## 2020-05-16 NOTE — Assessment & Plan Note (Signed)
Asymptomatic. Discussed likely pvc/pac. Recommend f/u if persisting or more than 5 in a minute.

## 2020-05-16 NOTE — Assessment & Plan Note (Signed)
Colicky day of symptoms concerning for stone. Discussed option of flomax + straining urine and imaging if not improved vs getting imaging first. Will try flomax and straining and f/u Monday if not resolved to consider imaging. Also discussed with positional pain and no longer colicky he may have passed a stone and now with just muscle pain.

## 2020-05-16 NOTE — Assessment & Plan Note (Signed)
Pt notes some fatigue. Has been taking Vit D 1000 units daily. Will recheck.

## 2020-05-16 NOTE — Patient Instructions (Signed)
Take flomax once daily Call Monday if still having pain and no stone and will plan for imaging  Strain urine If you get the stone bring it back for study

## 2020-05-16 NOTE — Progress Notes (Signed)
Subjective:     Eric Huff is a 50 y.o. male presenting for Flank Pain (R x 1 week. Hx of kidney stones ) and Labs Only (Would like vitamin d rechecked )     HPI  #Flank pain - started 1 week - hx of kidney stones - self diagnosis - would feel it travel and would get better on its own - this one started last week - did keep him from work with more severe pain which was colicky - was coming and going and now a constant dull pain  - no blood in the urine  - some bubbles in urine  - no difficulty urinating or burning - did have some nausea on the severe pain day - has not seen a stone   Drinks propel water and lemonade - trying to avoid sugary drinks   #low vitamin d - would like this repeated   Review of Systems   Social History   Tobacco Use  Smoking Status Former Smoker  . Quit date: 01/27/2008  . Years since quitting: 12.3  Smokeless Tobacco Former User  Tobacco Comment   quit 2009        Objective:    BP Readings from Last 3 Encounters:  05/16/20 130/90  02/09/20 138/80  12/20/19 120/72   Wt Readings from Last 3 Encounters:  05/16/20 235 lb 8 oz (106.8 kg)  02/09/20 231 lb 4 oz (104.9 kg)  12/20/19 228 lb (103.4 kg)    BP 130/90   Pulse 67   Temp 97.8 F (36.6 C) (Temporal)   Ht 5\' 9"  (1.753 m)   Wt 235 lb 8 oz (106.8 kg)   SpO2 99%   BMI 34.78 kg/m    Physical Exam Constitutional:      Appearance: Normal appearance. He is not ill-appearing or diaphoretic.  HENT:     Right Ear: External ear normal.     Left Ear: External ear normal.  Eyes:     General: No scleral icterus.    Extraocular Movements: Extraocular movements intact.     Conjunctiva/sclera: Conjunctivae normal.  Cardiovascular:     Rate and Rhythm: Normal rate and regular rhythm.     Heart sounds: No murmur heard.   Pulmonary:     Effort: Pulmonary effort is normal. No respiratory distress.     Breath sounds: Normal breath sounds. No wheezing.  Abdominal:      General: Abdomen is flat. Bowel sounds are normal. There is no distension.     Palpations: Abdomen is soft.     Tenderness: There is no abdominal tenderness. There is no right CVA tenderness or left CVA tenderness.     Hernia: No hernia is present.  Musculoskeletal:     Cervical back: Neck supple.     Comments: Pt notes area of pain is on the right flank low thoracic area w/o ttp  Skin:    General: Skin is warm and dry.  Neurological:     Mental Status: He is alert. Mental status is at baseline.  Psychiatric:        Mood and Affect: Mood normal.       UA: negative     Assessment & Plan:   Problem List Items Addressed This Visit      Genitourinary   Kidney stone    Colicky day of symptoms concerning for stone. Discussed option of flomax + straining urine and imaging if not improved vs getting imaging first. Will try flomax and  straining and f/u Monday if not resolved to consider imaging. Also discussed with positional pain and no longer colicky he may have passed a stone and now with just muscle pain.       Relevant Medications   tamsulosin (FLOMAX) 0.4 MG CAPS capsule     Other   Vitamin D deficiency    Pt notes some fatigue. Has been taking Vit D 1000 units daily. Will recheck.       Relevant Orders   Vitamin D, 25-hydroxy   Palpitations    Asymptomatic. Discussed likely pvc/pac. Recommend f/u if persisting or more than 5 in a minute.       Other Visit Diagnoses    Flank pain    -  Primary   Relevant Medications   tamsulosin (FLOMAX) 0.4 MG CAPS capsule   Other Relevant Orders   POCT Urinalysis Dipstick (Automated) (Completed)        Return if symptoms worsen or fail to improve.  Lynnda Child, MD  This visit occurred during the SARS-CoV-2 public health emergency.  Safety protocols were in place, including screening questions prior to the visit, additional usage of staff PPE, and extensive cleaning of exam room while observing appropriate contact time as  indicated for disinfecting solutions.

## 2020-12-14 ENCOUNTER — Other Ambulatory Visit: Payer: Self-pay | Admitting: Family Medicine

## 2020-12-14 DIAGNOSIS — M1A0720 Idiopathic chronic gout, left ankle and foot, without tophus (tophi): Secondary | ICD-10-CM

## 2020-12-14 DIAGNOSIS — R7303 Prediabetes: Secondary | ICD-10-CM

## 2020-12-14 DIAGNOSIS — E785 Hyperlipidemia, unspecified: Secondary | ICD-10-CM

## 2020-12-14 DIAGNOSIS — E559 Vitamin D deficiency, unspecified: Secondary | ICD-10-CM

## 2020-12-14 DIAGNOSIS — Z125 Encounter for screening for malignant neoplasm of prostate: Secondary | ICD-10-CM

## 2020-12-17 ENCOUNTER — Other Ambulatory Visit: Payer: Self-pay

## 2020-12-17 ENCOUNTER — Other Ambulatory Visit: Payer: BC Managed Care – PPO

## 2020-12-17 ENCOUNTER — Other Ambulatory Visit (INDEPENDENT_AMBULATORY_CARE_PROVIDER_SITE_OTHER): Payer: BC Managed Care – PPO

## 2020-12-17 DIAGNOSIS — M1A0720 Idiopathic chronic gout, left ankle and foot, without tophus (tophi): Secondary | ICD-10-CM | POA: Diagnosis not present

## 2020-12-17 DIAGNOSIS — Z125 Encounter for screening for malignant neoplasm of prostate: Secondary | ICD-10-CM

## 2020-12-17 DIAGNOSIS — E559 Vitamin D deficiency, unspecified: Secondary | ICD-10-CM | POA: Diagnosis not present

## 2020-12-17 DIAGNOSIS — R7303 Prediabetes: Secondary | ICD-10-CM | POA: Diagnosis not present

## 2020-12-17 DIAGNOSIS — E785 Hyperlipidemia, unspecified: Secondary | ICD-10-CM

## 2020-12-17 LAB — COMPREHENSIVE METABOLIC PANEL
ALT: 28 U/L (ref 0–53)
AST: 25 U/L (ref 0–37)
Albumin: 4.1 g/dL (ref 3.5–5.2)
Alkaline Phosphatase: 80 U/L (ref 39–117)
BUN: 16 mg/dL (ref 6–23)
CO2: 28 mEq/L (ref 19–32)
Calcium: 9.2 mg/dL (ref 8.4–10.5)
Chloride: 101 mEq/L (ref 96–112)
Creatinine, Ser: 0.98 mg/dL (ref 0.40–1.50)
GFR: 88.75 mL/min (ref >60.00)
Glucose, Bld: 91 mg/dL (ref 70–99)
Potassium: 4.2 mEq/L (ref 3.5–5.1)
Sodium: 137 mEq/L (ref 135–145)
Total Bilirubin: 0.6 mg/dL (ref 0.2–1.2)
Total Protein: 6.7 g/dL (ref 6.0–8.3)

## 2020-12-17 LAB — LIPID PANEL
Cholesterol: 192 mg/dL (ref 0–200)
HDL: 38 mg/dL — ABNORMAL LOW (ref >39.00)
LDL Cholesterol: 126 mg/dL — ABNORMAL HIGH (ref 0–99)
NonHDL: 154.32
Total CHOL/HDL Ratio: 5
Triglycerides: 140 mg/dL (ref 0.0–149.0)
VLDL: 28 mg/dL (ref 0.0–40.0)

## 2020-12-17 LAB — PSA: PSA: 0.65 ng/mL (ref 0.10–4.00)

## 2020-12-17 LAB — VITAMIN D 25 HYDROXY (VIT D DEFICIENCY, FRACTURES): VITD: 45.91 ng/mL (ref 30.00–100.00)

## 2020-12-17 LAB — HEMOGLOBIN A1C: Hgb A1c MFr Bld: 5.9 % (ref 4.6–6.5)

## 2020-12-17 LAB — URIC ACID: Uric Acid, Serum: 8 mg/dL — ABNORMAL HIGH (ref 4.0–7.8)

## 2020-12-24 ENCOUNTER — Other Ambulatory Visit: Payer: Self-pay

## 2020-12-24 ENCOUNTER — Ambulatory Visit (INDEPENDENT_AMBULATORY_CARE_PROVIDER_SITE_OTHER): Payer: BC Managed Care – PPO | Admitting: Family Medicine

## 2020-12-24 ENCOUNTER — Encounter: Payer: Self-pay | Admitting: Family Medicine

## 2020-12-24 VITALS — BP 122/84 | HR 75 | Temp 97.8°F | Ht 68.0 in | Wt 231.0 lb

## 2020-12-24 DIAGNOSIS — M1A0720 Idiopathic chronic gout, left ankle and foot, without tophus (tophi): Secondary | ICD-10-CM

## 2020-12-24 DIAGNOSIS — Z0000 Encounter for general adult medical examination without abnormal findings: Secondary | ICD-10-CM | POA: Diagnosis not present

## 2020-12-24 DIAGNOSIS — E785 Hyperlipidemia, unspecified: Secondary | ICD-10-CM

## 2020-12-24 DIAGNOSIS — Z1211 Encounter for screening for malignant neoplasm of colon: Secondary | ICD-10-CM | POA: Diagnosis not present

## 2020-12-24 DIAGNOSIS — Z23 Encounter for immunization: Secondary | ICD-10-CM

## 2020-12-24 DIAGNOSIS — E559 Vitamin D deficiency, unspecified: Secondary | ICD-10-CM

## 2020-12-24 DIAGNOSIS — R7303 Prediabetes: Secondary | ICD-10-CM

## 2020-12-24 DIAGNOSIS — I10 Essential (primary) hypertension: Secondary | ICD-10-CM | POA: Diagnosis not present

## 2020-12-24 MED ORDER — SHINGRIX 50 MCG/0.5ML IM SUSR: 0.5000 mL | Freq: Once | INTRAMUSCULAR | 0 refills | Status: AC

## 2020-12-24 MED ORDER — COLCHICINE 0.6 MG PO TABS: 0.6000 mg | ORAL_TABLET | Freq: Every day | ORAL | 3 refills | Status: DC | PRN

## 2020-12-24 MED ORDER — LISINOPRIL 10 MG PO TABS: 10.0000 mg | ORAL_TABLET | Freq: Every day | ORAL | 3 refills | Status: DC

## 2020-12-24 NOTE — Assessment & Plan Note (Signed)
Levels well controlled on 5000 IU daily.

## 2020-12-24 NOTE — Assessment & Plan Note (Signed)
Encouraged limiting added sugars and sweetened beverages including juices in diet.

## 2020-12-24 NOTE — Assessment & Plan Note (Signed)
Preventative protocols reviewed and updated unless pt declined. Discussed healthy diet and lifestyle.  

## 2020-12-24 NOTE — Progress Notes (Signed)
Patient ID: Eric Huff, male    DOB: [birth year], 50 y.o.   MRN: [id number]  This visit was conducted in person.  BP 122/84   Pulse 75   Temp 97.8 F (36.6 C) (Temporal)   Ht '5\' 8"'  (1.727 m)   Wt 231 lb (104.8 kg)   SpO2 98%   BMI 35.12 kg/m    CC: CPE Subjective:   HPI: LARZ Eric Huff is a 50 y.o. male presenting on 12/24/2020 for Annual Exam   Gout - 1-2 flares per year, managed with colchicine.   Preventative: Colon cancer screening - continue yearly iFOB. Prostate cancer - denies prostate symptoms. H/o prostatitis. Continue yearly PSA.  Lung cancer screening - eligible, declines  Flu shot - declines  COVID vaccine - declines Tetanus shot - 2005. Tdap 10/2018 Shingrix - first shot today. He will try to get second shot at work. Rx printed today Seat belt use discussed  Sunscreen use discussed, no changing moles on skin.  Sleep - averaging 8 hours/night  Ex smoker, quit, ~30 PY hx, chewing some  Alcohol - rarely  Dentist Q6 mo Eye exam - last year    Lives with wife, youngest daughter  Occupation: Works for [employer]  Activity: no regular exercise - physical at job 12,000 steps/day Diet: good water, fruits/vegetables daily     Relevant past medical, surgical, family and social history reviewed and updated as indicated. Interim medical history since our last visit reviewed. Allergies and medications reviewed and updated. Outpatient Medications Prior to Visit  Medication Sig Dispense Refill   CALCIUM-MAGNESIUM-ZINC PO Take 3 tablets by mouth daily.     Cholecalciferol (VITAMIN D3) 125 MCG (5000 UT) CAPS Take 1 capsule by mouth daily.     loratadine (CLARITIN) 10 MG tablet Take 10 mg by mouth daily.     Naproxen Sodium 220 MG CAPS Take by mouth as needed.     colchicine 0.6 MG tablet Take 1 tablet (0.6 mg total) by mouth daily as needed. For gout flares, may take 2 tablets on the first day of flare 30 tablet 0   lisinopril (ZESTRIL) 10 MG  tablet Take 1 tablet (10 mg total) by mouth daily. 90 tablet 3   tamsulosin (FLOMAX) 0.4 MG CAPS capsule Take 1 capsule (0.4 mg total) by mouth daily. 30 capsule 0   Cholecalciferol (VITAMIN D3) 1.25 MG (50000 UT) TABS Take 1 tablet by mouth once a week. 12 tablet 0   No facility-administered medications prior to visit.     Per HPI unless specifically indicated in ROS section below Review of Systems  Constitutional:  Negative for activity change, appetite change, chills, fatigue, fever and unexpected weight change.  HENT:  Negative for hearing loss.   Eyes:  Negative for visual disturbance.  Respiratory:  Negative for cough, chest tightness, shortness of breath and wheezing.   Cardiovascular:  Negative for chest pain, palpitations and leg swelling.  Gastrointestinal:  Negative for abdominal distention, abdominal pain, blood in stool, constipation, diarrhea, nausea and vomiting.  Genitourinary:  Negative for difficulty urinating and hematuria.  Musculoskeletal:  Negative for arthralgias, myalgias and neck pain.  Skin:  Negative for rash.  Neurological:  Negative for dizziness, seizures, syncope and headaches.  Hematological:  Negative for adenopathy. Does not bruise/bleed easily.  Psychiatric/Behavioral:  Negative for dysphoric mood. The patient is not nervous/anxious.    Objective:  BP 122/84   Pulse 75   Temp 97.8 F (36.6 C) (Temporal)  Ht '5\' 8"'  (1.727 m)   Wt 231 lb (104.8 kg)   SpO2 98%   BMI 35.12 kg/m   Wt Readings from Last 3 Encounters:  12/24/20 231 lb (104.8 kg)  05/16/20 235 lb 8 oz (106.8 kg)  02/09/20 231 lb 4 oz (104.9 kg)      Physical Exam Vitals and nursing note reviewed.  Constitutional:      General: He is not in acute distress.    Appearance: Normal appearance. He is well-developed. He is not ill-appearing.  HENT:     Head: Normocephalic and atraumatic.     Right Ear: Hearing, tympanic membrane, ear canal and external ear normal.     Left Ear:  Hearing, tympanic membrane, ear canal and external ear normal.  Eyes:     General: No scleral icterus.    Extraocular Movements: Extraocular movements intact.     Conjunctiva/sclera: Conjunctivae normal.     Pupils: Pupils are equal, round, and reactive to light.  Neck:     Thyroid: No thyroid mass or thyromegaly.  Cardiovascular:     Rate and Rhythm: Normal rate and regular rhythm.     Pulses: Normal pulses.          Radial pulses are 2+ on the right side and 2+ on the left side.     Heart sounds: Normal heart sounds. No murmur heard. Pulmonary:     Effort: Pulmonary effort is normal. No respiratory distress.     Breath sounds: Normal breath sounds. No wheezing, rhonchi or rales.  Abdominal:     General: Bowel sounds are normal. There is no distension.     Palpations: Abdomen is soft. There is no mass.     Tenderness: There is no abdominal tenderness. There is no guarding or rebound.     Hernia: No hernia is present.  Musculoskeletal:        General: Normal range of motion.     Cervical back: Normal range of motion and neck supple.     Right lower leg: No edema.     Left lower leg: No edema.  Lymphadenopathy:     Cervical: No cervical adenopathy.  Skin:    General: Skin is warm and dry.     Findings: No rash.  Neurological:     General: No focal deficit present.     Mental Status: He is alert and oriented to person, place, and time.  Psychiatric:        Mood and Affect: Mood normal.        Behavior: Behavior normal.        Thought Content: Thought content normal.        Judgment: Judgment normal.      Results for orders placed or performed in visit on 12/17/20  VITAMIN D 25 Hydroxy (Vit-D Deficiency, Fractures)  Result Value Ref Range   VITD 45.91 30.00 - 100.00 ng/mL  Uric acid  Result Value Ref Range   Uric Acid, Serum 8.0 (H) 4.0 - 7.8 mg/dL  PSA  Result Value Ref Range   PSA 0.65 0.10 - 4.00 ng/mL  Hemoglobin A1c  Result Value Ref Range   Hgb A1c MFr Bld 5.9  4.6 - 6.5 %  Comprehensive metabolic panel  Result Value Ref Range   Sodium 137 135 - 145 mEq/L   Potassium 4.2 3.5 - 5.1 mEq/L   Chloride 101 96 - 112 mEq/L   CO2 28 19 - 32 mEq/L   Glucose, Bld 91 70 -  99 mg/dL   BUN 16 6 - 23 mg/dL   Creatinine, Ser 0.98 0.40 - 1.50 mg/dL   Total Bilirubin 0.6 0.2 - 1.2 mg/dL   Alkaline Phosphatase 80 39 - 117 U/L   AST 25 0 - 37 U/L   ALT 28 0 - 53 U/L   Total Protein 6.7 6.0 - 8.3 g/dL   Albumin 4.1 3.5 - 5.2 g/dL   GFR 88.75 >60.00 mL/min   Calcium 9.2 8.4 - 10.5 mg/dL  Lipid panel  Result Value Ref Range   Cholesterol 192 0 - 200 mg/dL   Triglycerides 140.0 0.0 - 149.0 mg/dL   HDL 38.00 (L) >39.00 mg/dL   VLDL 28.0 0.0 - 40.0 mg/dL   LDL Cholesterol 126 (H) 0 - 99 mg/dL   Total CHOL/HDL Ratio 5    NonHDL 154.32     Assessment & Plan:  This visit occurred during the SARS-CoV-2 public health emergency.  Safety protocols were in place, including screening questions prior to the visit, additional usage of staff PPE, and extensive cleaning of exam room while observing appropriate contact time as indicated for disinfecting solutions.   Problem List Items Addressed This Visit     Health maintenance examination - Primary (Chronic)    Preventative protocols reviewed and updated unless pt declined. Discussed healthy diet and lifestyle.       Essential hypertension    Chronic, stable on lisinopril 81m daily - continue.       Relevant Medications   lisinopril (ZESTRIL) 10 MG tablet   Prediabetes    Encouraged limiting added sugars and sweetened beverages including juices in diet.       Severe obesity (BMI 35.0-39.9) with comorbidity ([place]    Reviewed healthy diet and lifestyle changes to affect sustainable weight loss.        Dyslipidemia    Chronic off statin. Reviewed diet choices to improve LDL levels. The 10-year ASCVD risk score (Arnett DK, et al., 2019) is: 5.6%   Values used to calculate the score:     Age: [age]years      Sex: Male     Is Non-Hispanic African American: No     Diabetic: No     Tobacco smoker: No     Systolic Blood Pressure: 1244mmHg     Is BP treated: Yes     HDL Cholesterol: 38 mg/dL     Total Cholesterol: 192 mg/dL       Chronic gout    1-2 flares per year. Discussed adding daily urate lowering medication, he prefers to focus on diet choices and continue PRN colchicine.       Vitamin D deficiency    Levels well controlled on 5000 IU daily.       Other Visit Diagnoses     Special screening for malignant neoplasms, colon       Relevant Orders   Fecal occult blood, imunochemical   Need for shingles vaccine       Relevant Orders   Varicella-zoster vaccine IM (Completed)        Meds ordered this encounter  Medications   colchicine 0.6 MG tablet    Sig: Take 1 tablet (0.6 mg total) by mouth daily as needed. For gout flares, may take 2 tablets on the first day of flare    Dispense:  30 tablet    Refill:  3   lisinopril (ZESTRIL) 10 MG tablet    Sig: Take 1 tablet (10 mg total) by mouth  daily.    Dispense:  90 tablet    Refill:  3   Zoster Vaccine Adjuvanted [facility]) injection    Sig: Inject 0.5 mLs into the muscle once for 1 dose. Schedule between 02/24/2021 and 06/24/2021    Dispense:  0.5 mL    Refill:  0    First shingrix vaccine give 12/24/2020   Orders Placed This Encounter  Procedures   Fecal occult blood, imunochemical    Standing Status:   Future    Standing Expiration Date:   12/24/2021   Varicella-zoster vaccine IM     Patient instructions: First shingles shot today. Schedule nurse visit in 2-6 months to complete the series, or get done at work (Rx printed today).  Pass by lab to pick up stool kit. Consider lung cancer screening program in smoking history, let me know if interested for referral.  Increase water.  Good to see you today.  Return as needed or in 1 year for next physical.   Follow up plan: Return in about 1 year (around 12/24/2021) for  annual exam, prior fasting for blood work.  Ria Bush, MD

## 2020-12-24 NOTE — Patient Instructions (Addendum)
First shingles shot today. Schedule nurse visit in 2-6 months to complete the series, or get done at work (Rx printed today).  Pass by lab to pick up stool kit. Consider lung cancer screening program in smoking history, let me know if interested for referral.  Increase water.  Good to see you today.  Return as needed or in 1 year for next physical.   Health Maintenance, Male Adopting a healthy lifestyle and getting preventive care are important in promoting health and wellness. Ask your health care provider about: The right schedule for you to have regular tests and exams. Things you can do on your own to prevent diseases and keep yourself healthy. What should I know about diet, weight, and exercise? Eat a healthy diet  Eat a diet that includes plenty of vegetables, fruits, low-fat dairy products, and lean protein. Do not eat a lot of foods that are high in solid fats, added sugars, or sodium. Maintain a healthy weight Body mass index (BMI) is a measurement that can be used to identify possible weight problems. It estimates body fat based on height and weight. Your health care provider can help determine your BMI and help you achieve or maintain a healthy weight. Get regular exercise Get regular exercise. This is one of the most important things you can do for your health. Most adults should: Exercise for at least 150 minutes each week. The exercise should increase your heart rate and make you sweat (moderate-intensity exercise). Do strengthening exercises at least twice a week. This is in addition to the moderate-intensity exercise. Spend less time sitting. Even light physical activity can be beneficial. Watch cholesterol and blood lipids Have your blood tested for lipids and cholesterol at 52 years of age, then have this test every 5 years. You may need to have your cholesterol levels checked more often if: Your lipid or cholesterol levels are high. You are older than 52 years of  age. You are at high risk for heart disease. What should I know about cancer screening? Many types of cancers can be detected early and may often be prevented. Depending on your health history and family history, you may need to have cancer screening at various ages. This may include screening for: Colorectal cancer. Prostate cancer. Skin cancer. Lung cancer. What should I know about heart disease, diabetes, and high blood pressure? Blood pressure and heart disease High blood pressure causes heart disease and increases the risk of stroke. This is more likely to develop in people who have high blood pressure readings or are overweight. Talk with your health care provider about your target blood pressure readings. Have your blood pressure checked: Every 3-5 years if you are 28-80 years of age. Every year if you are 57 years old or older. If you are between the ages of 27 and 30 and are a current or former smoker, ask your health care provider if you should have a one-time screening for abdominal aortic aneurysm (AAA). Diabetes Have regular diabetes screenings. This checks your fasting blood sugar level. Have the screening done: Once every three years after age 47 if you are at a normal weight and have a low risk for diabetes. More often and at a younger age if you are overweight or have a high risk for diabetes. What should I know about preventing infection? Hepatitis B If you have a higher risk for hepatitis B, you should be screened for this virus. Talk with your health care provider to find out if  you are at risk for hepatitis B infection. Hepatitis C Blood testing is recommended for: Everyone born from [birth year] through 1967. Anyone with known risk factors for hepatitis C. Sexually transmitted infections (STIs) You should be screened each year for STIs, including gonorrhea and chlamydia, if: You are sexually active and are younger than 52 years of age. You are older than 52 years of age  and your health care provider tells you that you are at risk for this type of infection. Your sexual activity has changed since you were last screened, and you are at increased risk for chlamydia or gonorrhea. Ask your health care provider if you are at risk. Ask your health care provider about whether you are at high risk for HIV. Your health care provider may recommend a prescription medicine to help prevent HIV infection. If you choose to take medicine to prevent HIV, you should first get tested for HIV. You should then be tested every 3 months for as long as you are taking the medicine. Follow these instructions at home: Alcohol use Do not drink alcohol if your health care provider tells you not to drink. If you drink alcohol: Limit how much you have to 0-2 drinks a day. Know how much alcohol is in your drink. In the [place] one drink equals one 12 oz bottle of beer (355 mL), one 5 oz glass of wine (148 mL), or one 1 oz glass of hard liquor (44 mL). Lifestyle Do not use any products that contain nicotine or tobacco. These products include cigarettes, chewing tobacco, and vaping devices, such as e-cigarettes. If you need help quitting, ask your health care provider. Do not use street drugs. Do not share needles. Ask your health care provider for help if you need support or information about quitting drugs. General instructions Schedule regular health, dental, and eye exams. Stay current with your vaccines. Tell your health care provider if: You often feel depressed. You have ever been abused or do not feel safe at home. Summary Adopting a healthy lifestyle and getting preventive care are important in promoting health and wellness. Follow your health care provider's instructions about healthy diet, exercising, and getting tested or screened for diseases. Follow your health care provider's instructions on monitoring your cholesterol and blood pressure. This information is not intended to  replace advice given to you by your health care provider. Make sure you discuss any questions you have with your health care provider. Document Revised: 06/03/2020 Document Reviewed: 06/03/2020 Elsevier Patient Education  [place].

## 2020-12-24 NOTE — Assessment & Plan Note (Signed)
Reviewed healthy diet and lifestyle changes to affect sustainable weight loss.  

## 2020-12-24 NOTE — Assessment & Plan Note (Signed)
Chronic, stable on lisinopril 10mg daily - continue.  ?

## 2020-12-24 NOTE — Assessment & Plan Note (Signed)
1-2 flares per year. Discussed adding daily urate lowering medication, he prefers to focus on diet choices and continue PRN colchicine.

## 2020-12-24 NOTE — Assessment & Plan Note (Signed)
Chronic off statin. Reviewed diet choices to improve LDL levels. The 10-year ASCVD risk score (Arnett DK, et al., 2019) is: 5.6%   Values used to calculate the score:     Age: 50 years     Sex: Male     Is Non-Hispanic African American: No     Diabetic: No     Tobacco smoker: No     Systolic Blood Pressure: 122 mmHg     Is BP treated: Yes     HDL Cholesterol: 38 mg/dL     Total Cholesterol: 192 mg/dL

## 2021-03-13 ENCOUNTER — Ambulatory Visit (INDEPENDENT_AMBULATORY_CARE_PROVIDER_SITE_OTHER): Payer: BC Managed Care – PPO

## 2021-03-13 ENCOUNTER — Other Ambulatory Visit: Payer: Self-pay

## 2021-03-13 DIAGNOSIS — Z23 Encounter for immunization: Secondary | ICD-10-CM | POA: Diagnosis not present

## 2021-03-13 NOTE — Progress Notes (Signed)
Patient presented for 2nd shingles vaccine given by Markeshia Giebel, CMA to left deltoid, patient voiced no concerns nor showed any signs of distress during injection.  

## 2021-09-19 ENCOUNTER — Other Ambulatory Visit: Payer: Self-pay | Admitting: Family Medicine

## 2021-12-14 ENCOUNTER — Other Ambulatory Visit: Payer: Self-pay | Admitting: Family Medicine

## 2021-12-14 DIAGNOSIS — M1A0720 Idiopathic chronic gout, left ankle and foot, without tophus (tophi): Secondary | ICD-10-CM

## 2021-12-14 DIAGNOSIS — E559 Vitamin D deficiency, unspecified: Secondary | ICD-10-CM

## 2021-12-14 DIAGNOSIS — Z125 Encounter for screening for malignant neoplasm of prostate: Secondary | ICD-10-CM

## 2021-12-14 DIAGNOSIS — R7303 Prediabetes: Secondary | ICD-10-CM

## 2021-12-14 DIAGNOSIS — E785 Hyperlipidemia, unspecified: Secondary | ICD-10-CM

## 2021-12-16 ENCOUNTER — Other Ambulatory Visit (INDEPENDENT_AMBULATORY_CARE_PROVIDER_SITE_OTHER): Payer: BC Managed Care – PPO

## 2021-12-16 DIAGNOSIS — M1A0720 Idiopathic chronic gout, left ankle and foot, without tophus (tophi): Secondary | ICD-10-CM | POA: Diagnosis not present

## 2021-12-16 DIAGNOSIS — Z125 Encounter for screening for malignant neoplasm of prostate: Secondary | ICD-10-CM | POA: Diagnosis not present

## 2021-12-16 DIAGNOSIS — R7303 Prediabetes: Secondary | ICD-10-CM

## 2021-12-16 DIAGNOSIS — E559 Vitamin D deficiency, unspecified: Secondary | ICD-10-CM

## 2021-12-16 DIAGNOSIS — E785 Hyperlipidemia, unspecified: Secondary | ICD-10-CM

## 2021-12-16 LAB — COMPREHENSIVE METABOLIC PANEL
ALT: 35 U/L (ref 0–53)
AST: 27 U/L (ref 0–37)
Albumin: 4.2 g/dL (ref 3.5–5.2)
Alkaline Phosphatase: 77 U/L (ref 39–117)
BUN: 19 mg/dL (ref 6–23)
CO2: 30 mEq/L (ref 19–32)
Calcium: 9.1 mg/dL (ref 8.4–10.5)
Chloride: 102 mEq/L (ref 96–112)
Creatinine, Ser: 1.06 mg/dL (ref 0.40–1.50)
GFR: 80.21 mL/min (ref >60.00)
Glucose, Bld: 94 mg/dL (ref 70–99)
Potassium: 4.4 mEq/L (ref 3.5–5.1)
Sodium: 137 mEq/L (ref 135–145)
Total Bilirubin: 0.4 mg/dL (ref 0.2–1.2)
Total Protein: 6.6 g/dL (ref 6.0–8.3)

## 2021-12-16 LAB — LIPID PANEL
Cholesterol: 194 mg/dL (ref 0–200)
HDL: 36.2 mg/dL — ABNORMAL LOW (ref >39.00)
NonHDL: 158.25
Total CHOL/HDL Ratio: 5
Triglycerides: 215 mg/dL — ABNORMAL HIGH (ref 0.0–149.0)
VLDL: 43 mg/dL — ABNORMAL HIGH (ref 0.0–40.0)

## 2021-12-16 LAB — LDL CHOLESTEROL, DIRECT: Direct LDL: 114 mg/dL

## 2021-12-16 LAB — VITAMIN D 25 HYDROXY (VIT D DEFICIENCY, FRACTURES): VITD: 50.14 ng/mL (ref 30.00–100.00)

## 2021-12-16 LAB — HEMOGLOBIN A1C: Hgb A1c MFr Bld: 5.9 % (ref 4.6–6.5)

## 2021-12-16 LAB — PSA: PSA: 0.59 ng/mL (ref 0.10–4.00)

## 2021-12-16 LAB — URIC ACID: Uric Acid, Serum: 8.7 mg/dL — ABNORMAL HIGH (ref 4.0–7.8)

## 2021-12-17 ENCOUNTER — Other Ambulatory Visit: Payer: Self-pay

## 2021-12-17 ENCOUNTER — Inpatient Hospital Stay
Admission: EM | Admit: 2021-12-17 | Discharge: 2021-12-19 | DRG: 322 | Disposition: A | Discharge status: Home / Self Care | Payer: BC Managed Care – PPO | Attending: Hospitalist | Admitting: Hospitalist

## 2021-12-17 ENCOUNTER — Emergency Department: Payer: BC Managed Care – PPO

## 2021-12-17 DIAGNOSIS — Z803 Family history of malignant neoplasm of breast: Secondary | ICD-10-CM

## 2021-12-17 DIAGNOSIS — Z96652 Presence of left artificial knee joint: Secondary | ICD-10-CM | POA: Diagnosis not present

## 2021-12-17 DIAGNOSIS — I10 Essential (primary) hypertension: Secondary | ICD-10-CM | POA: Diagnosis not present

## 2021-12-17 DIAGNOSIS — Z79899 Other long term (current) drug therapy: Secondary | ICD-10-CM

## 2021-12-17 DIAGNOSIS — Z833 Family history of diabetes mellitus: Secondary | ICD-10-CM | POA: Diagnosis not present

## 2021-12-17 DIAGNOSIS — Z6838 Body mass index (BMI) 38.0-38.9, adult: Secondary | ICD-10-CM

## 2021-12-17 DIAGNOSIS — R001 Bradycardia, unspecified: Secondary | ICD-10-CM | POA: Diagnosis present

## 2021-12-17 DIAGNOSIS — Z87891 Personal history of nicotine dependence: Secondary | ICD-10-CM | POA: Diagnosis not present

## 2021-12-17 DIAGNOSIS — I249 Acute ischemic heart disease, unspecified: Secondary | ICD-10-CM | POA: Diagnosis not present

## 2021-12-17 DIAGNOSIS — Z8249 Family history of ischemic heart disease and other diseases of the circulatory system: Secondary | ICD-10-CM

## 2021-12-17 DIAGNOSIS — R079 Chest pain, unspecified: Secondary | ICD-10-CM | POA: Diagnosis not present

## 2021-12-17 DIAGNOSIS — R778 Other specified abnormalities of plasma proteins: Secondary | ICD-10-CM | POA: Diagnosis not present

## 2021-12-17 DIAGNOSIS — I2510 Atherosclerotic heart disease of native coronary artery without angina pectoris: Secondary | ICD-10-CM | POA: Diagnosis not present

## 2021-12-17 DIAGNOSIS — I472 Ventricular tachycardia, unspecified: Secondary | ICD-10-CM | POA: Diagnosis not present

## 2021-12-17 DIAGNOSIS — M109 Gout, unspecified: Secondary | ICD-10-CM | POA: Diagnosis present

## 2021-12-17 DIAGNOSIS — E785 Hyperlipidemia, unspecified: Secondary | ICD-10-CM | POA: Diagnosis not present

## 2021-12-17 DIAGNOSIS — M25512 Pain in left shoulder: Secondary | ICD-10-CM | POA: Diagnosis not present

## 2021-12-17 DIAGNOSIS — Z720 Tobacco use: Secondary | ICD-10-CM | POA: Diagnosis not present

## 2021-12-17 DIAGNOSIS — I25110 Atherosclerotic heart disease of native coronary artery with unstable angina pectoris: Secondary | ICD-10-CM | POA: Diagnosis not present

## 2021-12-17 DIAGNOSIS — R7989 Other specified abnormal findings of blood chemistry: Secondary | ICD-10-CM

## 2021-12-17 DIAGNOSIS — I214 Non-ST elevation (NSTEMI) myocardial infarction: Secondary | ICD-10-CM | POA: Diagnosis not present

## 2021-12-17 DIAGNOSIS — R0789 Other chest pain: Secondary | ICD-10-CM | POA: Diagnosis not present

## 2021-12-17 HISTORY — DX: Tobacco use: Z72.0

## 2021-12-17 HISTORY — DX: Morbid (severe) obesity due to excess calories: E66.01

## 2021-12-17 HISTORY — DX: Gout, unspecified: M10.9

## 2021-12-17 LAB — BASIC METABOLIC PANEL
Anion gap: 10 (ref 5–15)
BUN: 19 mg/dL (ref 6–20)
CO2: 21 mmol/L — ABNORMAL LOW (ref 22–32)
Calcium: 8.9 mg/dL (ref 8.9–10.3)
Chloride: 106 mmol/L (ref 98–111)
Creatinine, Ser: 1.09 mg/dL (ref 0.61–1.24)
GFR, Estimated: >60 mL/min (ref >60)
Glucose, Bld: 118 mg/dL — ABNORMAL HIGH (ref 70–99)
Potassium: 4.2 mmol/L (ref 3.5–5.1)
Sodium: 137 mmol/L (ref 135–145)

## 2021-12-17 LAB — CBC
HCT: 45.5 % (ref 39.0–52.0)
Hemoglobin: 14.9 g/dL (ref 13.0–17.0)
MCH: 27.5 pg (ref 26.0–34.0)
MCHC: 32.7 g/dL (ref 30.0–36.0)
MCV: 84.1 fL (ref 80.0–100.0)
Platelets: 273 10*3/uL (ref 150–400)
RBC: 5.41 MIL/uL (ref 4.22–5.81)
RDW: 13.6 % (ref 11.5–15.5)
WBC: 7.5 10*3/uL (ref 4.0–10.5)
nRBC: 0 % (ref 0.0–0.2)

## 2021-12-17 LAB — TROPONIN I (HIGH SENSITIVITY)
Troponin I (High Sensitivity): 20 ng/L — ABNORMAL HIGH (ref <18)
Troponin I (High Sensitivity): 50 ng/L — ABNORMAL HIGH (ref <18)
Troponin I (High Sensitivity): 390 ng/L (ref <18)
Troponin I (High Sensitivity): 831 ng/L (ref <18)

## 2021-12-17 LAB — PROTIME-INR
INR: 1 (ref 0.8–1.2)
Prothrombin Time: 12.8 seconds (ref 11.4–15.2)

## 2021-12-17 LAB — HEPARIN LEVEL (UNFRACTIONATED): Heparin Unfractionated: 0.14 IU/mL — ABNORMAL LOW (ref 0.30–0.70)

## 2021-12-17 LAB — APTT: aPTT: 33 seconds (ref 24–36)

## 2021-12-17 MED ORDER — LISINOPRIL 10 MG PO TABS
10.0000 mg | ORAL_TABLET | Freq: Every day | ORAL | Status: DC
  Administered 2021-12-18: 10 mg via ORAL
  Filled 2021-12-17: qty 1

## 2021-12-17 MED ORDER — ATORVASTATIN CALCIUM 20 MG PO TABS
40.0000 mg | ORAL_TABLET | Freq: Every day | ORAL | Status: DC
  Administered 2021-12-17 – 2021-12-18 (×2): 40 mg via ORAL
  Filled 2021-12-17 (×2): qty 2

## 2021-12-17 MED ORDER — ACETAMINOPHEN 325 MG PO TABS
650.0000 mg | ORAL_TABLET | ORAL | Status: DC | PRN
  Administered 2021-12-18 – 2021-12-19 (×4): 650 mg via ORAL
  Filled 2021-12-17 (×4): qty 2

## 2021-12-17 MED ORDER — COLCHICINE 0.6 MG PO TABS: 0.6000 mg | ORAL_TABLET | Freq: Every day | ORAL | Status: DC | PRN

## 2021-12-17 MED ORDER — NITROGLYCERIN 0.4 MG SL SUBL: 0.4000 mg | SUBLINGUAL_TABLET | SUBLINGUAL | Status: DC | PRN

## 2021-12-17 MED ORDER — METOPROLOL TARTRATE 25 MG PO TABS
25.0000 mg | ORAL_TABLET | Freq: Two times a day (BID) | ORAL | Status: DC
  Administered 2021-12-18: 25 mg via ORAL
  Filled 2021-12-17: qty 1

## 2021-12-17 MED ORDER — HEPARIN BOLUS VIA INFUSION
4000.0000 [IU] | Freq: Once | INTRAVENOUS | Status: AC
  Administered 2021-12-17: 4000 [IU] via INTRAVENOUS
  Filled 2021-12-17: qty 4000

## 2021-12-17 MED ORDER — ASPIRIN 81 MG PO TBEC
81.0000 mg | DELAYED_RELEASE_TABLET | Freq: Every day | ORAL | Status: DC
  Administered 2021-12-18 – 2021-12-19 (×2): 81 mg via ORAL
  Filled 2021-12-17 (×2): qty 1

## 2021-12-17 MED ORDER — ONDANSETRON HCL 4 MG/2ML IJ SOLN: 4.0000 mg | Freq: Four times a day (QID) | INTRAMUSCULAR | Status: DC | PRN

## 2021-12-17 MED ORDER — ASPIRIN 81 MG PO CHEW
324.0000 mg | CHEWABLE_TABLET | Freq: Once | ORAL | Status: AC
  Administered 2021-12-17: 324 mg via ORAL
  Filled 2021-12-17: qty 4

## 2021-12-17 MED ORDER — HEPARIN (PORCINE) 25000 UT/250ML-% IV SOLN
1900.0000 [IU]/h | INTRAVENOUS | Status: DC
  Administered 2021-12-17: 1200 [IU]/h via INTRAVENOUS
  Administered 2021-12-18: 1500 [IU]/h via INTRAVENOUS
  Administered 2021-12-18: 1900 [IU]/h via INTRAVENOUS
  Filled 2021-12-17 (×3): qty 250

## 2021-12-17 NOTE — Assessment & Plan Note (Signed)
BMI 38.32 Complicates overall prognosis and care Lifestyle modification and exercise has been discussed with patient in detail

## 2021-12-17 NOTE — Assessment & Plan Note (Signed)
Continue lisinopril 10 mg daily. 

## 2021-12-17 NOTE — Consult Note (Addendum)
ANTICOAGULATION CONSULT NOTE - Initial Consult  Pharmacy Consult for heparin Indication: ACS/STEMI  No Known Allergies  Patient Measurements: Height: 5\' 8"  (172.7 cm) Weight: 114.3 kg (252 lb) IBW/kg (Calculated) : 68.4 Heparin Dosing Weight: 91.3  Vital Signs: Temp: 98.3 F (36.8 C) (11/22 1425) Temp Source: Oral (11/22 1425) BP: 162/91 (11/22 1615) Pulse Rate: 57 (11/22 1615)  Labs: Recent Labs    12/16/21 0946 12/17/21 1433 12/17/21 1626  HGB  --  14.9  --   HCT  --  45.5  --   PLT  --  273  --   CREATININE 1.06 1.09  --   TROPONINIHS  --  20* 50*    Estimated Creatinine Clearance: 96.2 mL/min (by C-G formula based on SCr of 1.09 mg/dL).   Medical History: Past Medical History:  Diagnosis Date   Hypertension 12-2005   Plantar fascia rupture 08/29/2010    Medications:  No chronic anticoagulation PTA from chart review  Assessment: 51 yo M presents with chest pain radiating to left shoulder along with neck and back. Pharmacy has been consulted to initiate and manage heparin ISO ACS/STEMI. Troponins trending up currently, from 20 >> 50. ECG shows nonspecific ST abnormality and sinus bradycardia. CXR negative for active cardiopulmonary disease. Hgb, Hct, PLT stable.  Date Time aPTT/HL Rate/Comment       Baseline Labs: aPTT - ordered; INR - ordered Hgb - 14.9; Plts - 273  Goal of Therapy:  Heparin level 0.3-0.7 units/ml Monitor platelets by anticoagulation protocol: Yes   Plan:  Give 4000 units bolus x1; then start heparin infusion at 1200 units/hr Check anti-Xa level in 6 hours and daily once consecutively therapeutic. Continue to monitor H&H and platelets daily while on heparin gtt.  Eric M. 38, PharmD PGY-1 Pharmacy Resident 12/17/2021 5:30 PM

## 2021-12-17 NOTE — ED Provider Triage Note (Signed)
Emergency Medicine Provider Triage Evaluation Note  Eric Huff , a 51 y.o. male  was evaluated in triage.  Pt complains of chest pain that started 30 minutes pta.  No prior heart history.    Review of Systems  Positive: Left anterior chest pain with radiation down left arm Negative: No nausea, vomiting, indigestion   Physical Exam  BP (!) 169/97 (BP Location: Right Arm)   Pulse (!) 55   Temp 98.3 F (36.8 C) (Oral)   Resp 18   Ht 5\' 8"  (1.727 m)   Wt 104.8 kg   SpO2 100%   BMI 35.12 kg/m  Gen:   Awake, no distress   Resp:  Normal effort  MSK:   Moves extremities without difficulty  Other:    Medical Decision Making  Medically screening exam initiated at 2:31 PM.  Appropriate orders placed.  LATREL SZYMCZAK was informed that the remainder of the evaluation will be completed by another provider, this initial triage assessment does not replace that evaluation, and the importance of remaining in the ED until their evaluation is complete.     [name], PA-C 12/17/21 1443

## 2021-12-17 NOTE — ED Notes (Signed)
At bedside, pt reports chest pain comes and goes. He is in the stretcher, on the monitor with wife at bedside. Md aware.

## 2021-12-17 NOTE — ED Notes (Signed)
Dr. Derrill Kay made aware of troponin level of 50

## 2021-12-17 NOTE — Progress Notes (Addendum)
Eric Schwartz NP made aware of patient's troponin increased from 20 to 50 to 390 to 831. On Heparin 12 ml/hr, next heparin level is at 0000.  Next troponin to be drawn at 2330. Vitals 159/90, HR 55, resp 18, 100% on room air.  EKG completed, placed on chart.  Eric Schwartz NP made aware of troponin of 2143. Tylenol given due to patient discomfort to left shoulder. 4/10 pain scale. Heparin gtt bolus given and increased to 1500 units/hr. No new orders at this time.

## 2021-12-17 NOTE — ED Triage Notes (Signed)
Reports chest pain that started 30 minutes ago radiating to left shoulder and down arm along with neck and back.  Reports he had this feeling on Monday but went away.  Took tums which did not relief pain. Denies n/v and sob.

## 2021-12-17 NOTE — ED Provider Notes (Signed)
[facility] Provider Note    Event Date/Time   First MD Initiated Contact with Patient 12/17/21 1612     (approximate)   History   Chest Pain   HPI  Eric Huff is a 51 y.o. male who presents to the emergency department today because of concerns for chest pain.  Located in his left chest.  He describes it as pressure-like.  There is radiation into his shoulder left neck and left arm.  Patient did not have any shortness of breath.  Did feel little flushed.  Continues to have some of the symptoms at the time my exam.  The patient says that 2 days ago he felt like he was having some indigestion.  Denies any unusual exertion today.     Physical Exam   Triage Vital Signs: ED Triage Vitals  Enc Vitals Group     BP 12/17/21 1425 (!) 169/97     Pulse Rate 12/17/21 1425 (!) 55     Resp 12/17/21 1425 18     Temp 12/17/21 1425 98.3 F (36.8 C)     Temp Source 12/17/21 1425 Oral     SpO2 12/17/21 1425 100 %     Weight 12/17/21 1423 231 lb (104.8 kg)     Height 12/17/21 1423 5\' 8"  (1.727 m)     Head Circumference --      Peak Flow --      Pain Score 12/17/21 1422 4     Pain Loc --      Pain Edu? --      Excl. in [place]? --     Most recent vital signs: Vitals:   12/17/21 1425 12/17/21 1615  BP: (!) 169/97 (!) 162/91  Pulse: (!) 55 (!) 57  Resp: 18 13  Temp: 98.3 F (36.8 C)   SpO2: 100% 97%   General: Awake, alert, oriented. CV:  Good peripheral perfusion. Bradycardia.  Resp:  Normal effort. Lungs clear. Abd:  No distention. Non tender. Other:  No lower extremity edema. RP2+ in bilateral upper extremities.    ED Results / Procedures / Treatments   Labs (all labs ordered are listed, but only abnormal results are displayed) Labs Reviewed  BASIC METABOLIC PANEL - Abnormal; Notable for the following components:      Result Value   CO2 21 (*)    Glucose, Bld 118 (*)    All other components within normal limits  TROPONIN I (HIGH SENSITIVITY)  - Abnormal; Notable for the following components:   Troponin I (High Sensitivity) 20 (*)    All other components within normal limits  CBC  TROPONIN I (HIGH SENSITIVITY)     EKG  I, Nance Pear, attending physician, personally viewed and interpreted this EKG  EKG Time: 1425 Rate: 54 Rhythm: sinus bradycardia Axis: normal Intervals: qtc 451 QRS: narrow ST changes: no st elevation Impression: abnormal ekg  RADIOLOGY I independently interpreted and visualized the CXR. My interpretation: No acute abnormality Radiology interpretation:  IMPRESSION:  No active cardiopulmonary disease.      PROCEDURES:  Critical Care performed: Yes  Procedures  CRITICAL CARE Performed by: Nance Pear   Total critical care time: 35 minutes  Critical care time was exclusive of separately billable procedures and treating other patients.  Critical care was necessary to treat or prevent imminent or life-threatening deterioration.  Critical care was time spent personally by me on the following activities: development of treatment plan with patient and/or surrogate as well as nursing,  discussions with consultants, evaluation of patient's response to treatment, examination of patient, obtaining history from patient or surrogate, ordering and performing treatments and interventions, ordering and review of laboratory studies, ordering and review of radiographic studies, pulse oximetry and re-evaluation of patient's condition.   MEDICATIONS ORDERED IN ED: Medications  nitroGLYCERIN (NITROSTAT) SL tablet 0.4 mg (has no administration in time range)  aspirin chewable tablet 324 mg (324 mg Oral Given 12/17/21 1624)     IMPRESSION / MDM / ASSESSMENT AND PLAN / ED COURSE  I reviewed the triage vital signs and the nursing notes.                              Differential diagnosis includes, but is not limited to, PE, pneumonia, ACS, costochondritis.   Patient's presentation is most  consistent with acute presentation with potential threat to life or bodily function.  The patient is on the cardiac monitor to evaluate for evidence of arrhythmia and/or significant heart rate changes.  Patient presented to the emergency department today because of concerns for chest pain. EKG without concerning abnormalities. CXR with pneumonia or pneumothorax. Initial troponin very minimally elevated at 20. Repeat however elevated at 50. Given change do have concern for cardiac etiology. Patient was written to start heparin drip. Discussed with Dr. Joylene Igo with the hospitalist service who will plan on admission.     FINAL CLINICAL IMPRESSION(S) / ED DIAGNOSES   Final diagnoses:  Chest pain, unspecified type  Elevated troponin     Note:  This document was prepared using Dragon voice recognition software and may include unintentional dictation errors.    Phineas Semen, MD 12/17/21 [phone]

## 2021-12-17 NOTE — H&P (Signed)
History and Physical    Patient: Eric Huff IFO:[id number] DOB: 1970-02-15 DOA: 12/17/2021 DOS: the patient was seen and examined on 12/17/2021 PCP: Eustaquio Boyden, MD  Patient coming from: Home  Chief Complaint:  Chief Complaint  Patient presents with   Chest Pain   HPI: Eric Huff is a 51 y.o. male with medical history significant for hypertension, obesity, gout who presents to the ER via private vehicle for evaluation of chest pain mostly over the left anterior chest wall with radiation to the left shoulder and down his left arm, up his neck and back.  He rated his pain a 7 x 10 in intensity at its worst.  He described feeling very clammy but denies having any shortness of breath, no nausea, no vomiting or palpitations.  He had a similar episode 2 days prior to this admission while at work but it resolved without any intervention.  He has a significant family history of coronary artery disease and decided to come to the ER to get checked out. He denies having any fever, no chills, no cough, no headache, no blurred vision, no changes in his bowel habits, no urinary symptoms, no abdominal pain, no leg swelling, no blurred vision no focal deficit. Labs show an uptrending troponin level 20 >> 50 Twelve-lead EKG shows sinus bradycardia with nonspecific ST changes Patient received aspirin and has been started on a heparin drip in the ER At the time of my evaluation he is currently chest pain-free.   Review of Systems: As mentioned in the history of present illness. All other systems reviewed and are negative. Past Medical History:  Diagnosis Date   Hypertension 12-2005   Plantar fascia rupture 08/29/2010   Past Surgical History:  Procedure Laterality Date   KNEE ARTHROSCOPY     Left and right acl    ORIF ELBOW FRACTURE     left   ORIF FEMUR FRACTURE     left   REPLACEMENT TOTAL KNEE     left   SHOULDER SURGERY  2003   Social History:  reports that he quit smoking  about 13 years ago. His smoking use included cigarettes. He has quit using smokeless tobacco. He reports that he does not drink alcohol and does not use drugs.  No Known Allergies  Family History  Problem Relation Age of Onset   Hypertension Father    CAD Maternal Grandfather 78       massive MI   CAD Paternal Grandfather        MI and CABG   Hypertension Paternal Grandmother    Diabetes Paternal Grandmother    Hypertension Cousin    Cancer Maternal Grandmother        breast   Cancer Other 5       niece with rhabdomyosarcoma    Prior to Admission medications   Medication Sig Start Date End Date Taking? Authorizing Provider  CALCIUM-MAGNESIUM-ZINC PO Take 3 tablets by mouth daily.    [provider]  Cholecalciferol (VITAMIN D3) 125 MCG (5000 UT) CAPS Take 1 capsule by mouth daily.    [provider]  colchicine 0.6 MG tablet Take 1 tablet (0.6 mg total) by mouth daily as needed. For gout flares, may take 2 tablets on the first day of flare 12/24/20   Eustaquio Boyden, MD  lisinopril (ZESTRIL) 10 MG tablet TAKE 1 TABLET BY MOUTH DAILY 09/22/21   Eustaquio Boyden, MD  loratadine (CLARITIN) 10 MG tablet Take 10 mg by mouth daily.  [provider]  Naproxen Sodium 220 MG CAPS Take by mouth as needed.    [provider]    Physical Exam: Vitals:   12/17/21 1627 12/17/21 1630 12/17/21 1700 12/17/21 1730  BP:  (!) 150/87 (!) 145/87 (!) 143/87  Pulse:  (!) 51 (!) 51 (!) 58  Resp:  15 16 10   Temp:      TempSrc:      SpO2:  97% 97% 97%  Weight: 114.3 kg     Height:       Physical Exam Vitals and nursing note reviewed.  Constitutional:      Appearance: He is obese.  HENT:     Head: Normocephalic and atraumatic.  Eyes:     Pupils: Pupils are equal, round, and reactive to light.  Cardiovascular:     Rate and Rhythm: Bradycardia present.     Heart sounds: Normal heart sounds.  Pulmonary:     Effort: Pulmonary effort is normal.   Abdominal:     General: Bowel sounds are normal.     Palpations: Abdomen is soft.  Musculoskeletal:        General: Normal range of motion.     Cervical back: Normal range of motion and neck supple.  Skin:    General: Skin is warm and dry.  Neurological:     General: No focal deficit present.     Mental Status: He is alert.  Psychiatric:        Mood and Affect: Mood normal.        Behavior: Behavior normal.     Data Reviewed: Relevant notes from primary care and specialist visits, past discharge summaries as available in EHR, including Care Everywhere. Prior diagnostic testing as pertinent to current admission diagnoses Updated medications and problem lists for reconciliation ED course, including vitals, labs, imaging, treatment and response to treatment Triage notes, nursing and pharmacy notes and ED provider's notes Notable results as noted in HPI Labs reviewed.  Sodium 137, potassium 4.2, chloride 106, bicarb 21, glucose 118, BUN 19, creatinine 1.09, calcium 8.9, white count 7.5, hemoglobin 14.9, hematocrit 45.5, platelets 273, hemoglobin A1c 5.9 Chest x-ray reviewed by me shows no evidence of acute cardiopulmonary disease There are no new results to review at this time.  Assessment and Plan: * ACS (acute coronary syndrome) [facility]) Patient presents for evaluation of chest pain with radiation to his left arm and neck. He has an uptrending troponin level concerning for an acute coronary syndrome and has a significant family history of coronary artery disease Cycle cardiac enzymes Continue heparin drip initiated in the ER Continue aspirin and high intensity statins Start patient on metoprolol with holding parameters since he has bradycardia Obtain 2D echocardiogram to assess LVEF and rule out regional wall motion abnormality Consult cardiology  Severe obesity (BMI 35.0-39.9) with comorbidity (HCC) BMI 38.32 Complicates overall prognosis and care Lifestyle modification and  exercise has been discussed with patient in detail  Essential hypertension Continue lisinopril 10 mg daily      Advance Care Planning:   Code Status: Full Code   Consults: Cardiology  Family Communication: Greater than 50% of time was spent discussing patient's condition and plan of care with him and his wife at the bedside.  All questions and concerns have been addressed.  They verbalized understanding and agree with the plan.  Severity of Illness: The appropriate patient status for this patient is OBSERVATION. Observation status is judged to be reasonable and necessary in order to provide the  required intensity of service to ensure the patient's safety. The patient's presenting symptoms, physical exam findings, and initial radiographic and laboratory data in the context of their medical condition is felt to place them at decreased risk for further clinical deterioration. Furthermore, it is anticipated that the patient will be medically stable for discharge from the hospital within 2 midnights of admission.   Author: Lucile Shutters, MD 12/17/2021 6:04 PM  For on call review [email or web address].

## 2021-12-17 NOTE — Assessment & Plan Note (Signed)
Patient presents for evaluation of chest pain with radiation to his left arm and neck. He has an uptrending troponin level concerning for an acute coronary syndrome and has a significant family history of coronary artery disease Cycle cardiac enzymes Continue heparin drip initiated in the ER Continue aspirin and high intensity statins Start patient on metoprolol with holding parameters since he has bradycardia Obtain 2D echocardiogram to assess LVEF and rule out regional wall motion abnormality Consult cardiology

## 2021-12-18 ENCOUNTER — Other Ambulatory Visit: Payer: Self-pay

## 2021-12-18 ENCOUNTER — Encounter: Payer: Self-pay | Admitting: Internal Medicine

## 2021-12-18 ENCOUNTER — Inpatient Hospital Stay (HOSPITAL_COMMUNITY)
Admit: 2021-12-18 | Discharge: 2021-12-18 | Disposition: A | Discharge status: Home / Self Care | Payer: BC Managed Care – PPO | Attending: Internal Medicine | Admitting: Internal Medicine

## 2021-12-18 DIAGNOSIS — E785 Hyperlipidemia, unspecified: Secondary | ICD-10-CM

## 2021-12-18 DIAGNOSIS — I214 Non-ST elevation (NSTEMI) myocardial infarction: Principal | ICD-10-CM

## 2021-12-18 DIAGNOSIS — R079 Chest pain, unspecified: Secondary | ICD-10-CM

## 2021-12-18 DIAGNOSIS — I10 Essential (primary) hypertension: Secondary | ICD-10-CM

## 2021-12-18 LAB — CBC
HCT: 42.1 % (ref 39.0–52.0)
Hemoglobin: 14.1 g/dL (ref 13.0–17.0)
MCH: 27.9 pg (ref 26.0–34.0)
MCHC: 33.5 g/dL (ref 30.0–36.0)
MCV: 83.2 fL (ref 80.0–100.0)
Platelets: 265 10*3/uL (ref 150–400)
RBC: 5.06 MIL/uL (ref 4.22–5.81)
RDW: 13.7 % (ref 11.5–15.5)
WBC: 9.1 10*3/uL (ref 4.0–10.5)
nRBC: 0 % (ref 0.0–0.2)

## 2021-12-18 LAB — LIPID PANEL
Cholesterol: 208 mg/dL — ABNORMAL HIGH (ref 0–200)
HDL: 34 mg/dL — ABNORMAL LOW (ref >40)
LDL Cholesterol: 138 mg/dL — ABNORMAL HIGH (ref 0–99)
Total CHOL/HDL Ratio: 6.1 RATIO
Triglycerides: 181 mg/dL — ABNORMAL HIGH (ref <150)
VLDL: 36 mg/dL (ref 0–40)

## 2021-12-18 LAB — ECHOCARDIOGRAM COMPLETE
AR max vel: 2.16 cm2
AV Area VTI: 1.99 cm2
AV Area mean vel: 2.03 cm2
AV Mean grad: 3 mmHg
AV Peak grad: 4.8 mmHg
Ao pk vel: 1.1 m/s
Area-P 1/2: 3.15 cm2
Calc EF: 62.6 %
Height: 69 in
S' Lateral: 3.6 cm
Single Plane A2C EF: 63.7 %
Single Plane A4C EF: 60.8 %
Weight: 3964.8 oz

## 2021-12-18 LAB — HEPARIN LEVEL (UNFRACTIONATED)
Heparin Unfractionated: 0.1 IU/mL — ABNORMAL LOW (ref 0.30–0.70)
Heparin Unfractionated: 0.28 IU/mL — ABNORMAL LOW (ref 0.30–0.70)
Heparin Unfractionated: 0.29 IU/mL — ABNORMAL LOW (ref 0.30–0.70)

## 2021-12-18 LAB — HIV ANTIBODY (ROUTINE TESTING W REFLEX): HIV Screen 4th Generation wRfx: NONREACTIVE

## 2021-12-18 LAB — TROPONIN I (HIGH SENSITIVITY)
Troponin I (High Sensitivity): 2143 ng/L (ref <18)
Troponin I (High Sensitivity): 3885 ng/L (ref <18)
Troponin I (High Sensitivity): 5865 ng/L (ref <18)

## 2021-12-18 MED ORDER — PERFLUTREN LIPID MICROSPHERE
1.0000 mL | INTRAVENOUS | Status: AC | PRN
  Administered 2021-12-18: 2 mL via INTRAVENOUS

## 2021-12-18 MED ORDER — ATORVASTATIN CALCIUM 80 MG PO TABS
80.0000 mg | ORAL_TABLET | Freq: Every day | ORAL | Status: DC
  Administered 2021-12-19: 80 mg via ORAL
  Filled 2021-12-18: qty 1

## 2021-12-18 MED ORDER — NITROGLYCERIN 2 % TD OINT
1.0000 [in_us] | TOPICAL_OINTMENT | Freq: Four times a day (QID) | TRANSDERMAL | Status: DC
  Administered 2021-12-18 – 2021-12-19 (×5): 1 [in_us] via TOPICAL
  Filled 2021-12-18 (×5): qty 1

## 2021-12-18 MED ORDER — HEPARIN BOLUS VIA INFUSION
2700.0000 [IU] | Freq: Once | INTRAVENOUS | Status: DC
  Filled 2021-12-18: qty 2700

## 2021-12-18 MED ORDER — LISINOPRIL 20 MG PO TABS
20.0000 mg | ORAL_TABLET | Freq: Every day | ORAL | Status: DC
  Administered 2021-12-19: 20 mg via ORAL
  Filled 2021-12-18: qty 1

## 2021-12-18 MED ORDER — HEPARIN BOLUS VIA INFUSION
3000.0000 [IU] | Freq: Once | INTRAVENOUS | Status: AC
  Administered 2021-12-18: 3000 [IU] via INTRAVENOUS
  Filled 2021-12-18: qty 3000

## 2021-12-18 MED ORDER — LISINOPRIL 10 MG PO TABS
10.0000 mg | ORAL_TABLET | Freq: Once | ORAL | Status: AC
  Administered 2021-12-18: 10 mg via ORAL
  Filled 2021-12-18: qty 1

## 2021-12-18 MED ORDER — HEPARIN BOLUS VIA INFUSION
2700.0000 [IU] | Freq: Once | INTRAVENOUS | Status: AC
  Administered 2021-12-18: 2700 [IU] via INTRAVENOUS
  Filled 2021-12-18: qty 2700

## 2021-12-18 MED ORDER — SODIUM CHLORIDE 0.9% FLUSH
3.0000 mL | Freq: Two times a day (BID) | INTRAVENOUS | Status: DC
  Administered 2021-12-18 (×2): 3 mL via INTRAVENOUS

## 2021-12-18 MED ORDER — CARVEDILOL 3.125 MG PO TABS
3.1250 mg | ORAL_TABLET | Freq: Two times a day (BID) | ORAL | Status: DC
  Administered 2021-12-18: 3.125 mg via ORAL
  Filled 2021-12-18: qty 1

## 2021-12-18 NOTE — Consult Note (Addendum)
Cardiology Consult    Patient ID: NAI DASCH MRN: [id number], DOB/AGE: 1970/10/18   Admit date: 12/17/2021 Date of Consult: 12/18/2021  Primary Physician: Eustaquio Boyden, MD Primary Cardiologist: New Requesting Provider: Laurette Schimke, MD  Patient Profile    Eric Huff is a 51 y.o. male with a history of HTN, obesity, tob abuse, and gout, who is being seen today for the evaluation of chest pain and NSTEMI at the request of Dr. Fran Lowes.  Past Medical History   Past Medical History:  Diagnosis Date   Gout    Hypertension 12/26/2005   Morbid obesity (HCC)    Plantar fascia rupture 08/29/2010   Tobacco use    a. Smokes 1 cigar daily.    Past Surgical History:  Procedure Laterality Date   KNEE ARTHROSCOPY     Left and right acl    ORIF ELBOW FRACTURE     left   ORIF FEMUR FRACTURE     left   REPLACEMENT TOTAL KNEE     left   SHOULDER SURGERY  2003     Allergies  No Known Allergies  History of Present Illness    51 y/o ? w/ a h/o HTN, Gout, tob abuse, and morbid obesity.  No prior cardiac hx.  He lives locally w/ his wife.  Two grown children.  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  Fairly active @ work - a lot of physical exertion loading/unloading truck.  Otw, does not routinely exercise.  Prev smoked cigarettes but quit in 2009.  Now smoking a cigar daily.  He was in his USOH until the night of 11/20, when while @ work, he noted exertional left upper chest pressure and heaviness w/o assoc symptoms.  This would occur every time that he walked, last about 10 mins, and resolve w/ rest.  He had Tuesday night off, and just took it easy w/o recurrent symptoms.  On Wednesday 11/22, he had just blown leaves off of his deck w/o symptoms, but upon getting ready to sit in his recliner to take a nap prior to work, he developed severe left upper chest and shoulder pain associated w/ diaphoresis.  Symptoms persisted prompting him to drive to [facility] w/in 30 mins of onset.  Here, ECG  showed sinus brady w/ TWI in I, aVL, V1, and Vw.  Initial HsTrop was 20 and has subsequently trended up: 20  50  390  831  2143  3885.  He was eventually given ASA and placed on heparin, ? blocker, and high potency statin.  Since admission, he has had mild, intermittent left chest pressure w/o associated Ss.  ECG w/o acute changes this AM.  Currently chest pain free.  Wife @ bedside.  Inpatient Medications     aspirin EC  81 mg Oral Daily   atorvastatin  40 mg Oral Daily   lisinopril  10 mg Oral Once   [START ON 12/19/2021] lisinopril  20 mg Oral Daily   metoprolol tartrate  25 mg Oral BID   nitroGLYCERIN  1 inch Topical Q6H   sodium chloride flush  3 mL Intravenous Q12H  Heparin gtt  Family History    Family History  Problem Relation Age of Onset   Hypertension Father    CAD Maternal Grandfather 28       massive MI   CAD Paternal Grandfather        MI and CABG   Hypertension Paternal Grandmother    Diabetes Paternal Grandmother  Hypertension Cousin    Cancer Maternal Grandmother        breast   Cancer Other 5       niece with rhabdomyosarcoma   He indicated that his mother is alive. He indicated that his father is alive. He indicated that his brother is alive. He indicated that the status of his maternal grandmother is unknown. He indicated that his maternal grandfather is deceased. He indicated that the status of his paternal grandmother is unknown. He indicated that his paternal grandfather is alive. He indicated that the status of his cousin is unknown. He indicated that the status of his other is unknown.   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  Now smokes a cigar daily.  Substance and Sexual Activity   Alcohol use: No    Alcohol/week: 0.0 standard drinks of alcohol   Drug use: No   Sexual activity: Not on file  Other Topics Concern   Not on file  Social History Narrative   "[name]"   Lives with wife, youngest daughter, 2 dogs, bearded dragon   Occupation: Works as a Transport planner, second shift   Activity: no regular exercise, some yardwork    Diet: good water, fruits/vegetables daily, smaller portions more frequency   Social Determinants of Corporate investment banker Strain: Not on file  Food Insecurity: No Food Insecurity (12/17/2021)   Hunger Vital Sign    Worried About Running Out of Food in the Last Year: Never true    Ran Out of Food in the Last Year: Never true  Transportation Needs: No Transportation Needs (12/17/2021)   PRAPARE - Administrator, Civil Service (Medical): No    Lack of Transportation (Non-Medical): No  Physical Activity: Not on file  Stress: Not on file  Social Connections: Not on file  Intimate Partner Violence: Unknown (12/17/2021)   Humiliation, Afraid, Rape, and Kick questionnaire    Fear of Current or Ex-Partner: No    Emotionally Abused: No    Physically Abused: Not on file    Sexually Abused: No     Review of Systems    General:  No chills, fever, night sweats or weight changes.  Cardiovascular:  +++ chest pain, no dyspnea on exertion, edema, orthopnea, palpitations, paroxysmal nocturnal dyspnea.  Noted occas palpitation in the setting of outdoor activity and presumed dehydration over the Summer. Dermatological: No rash, lesions/masses Respiratory: No cough, dyspnea Urologic: No hematuria, dysuria Abdominal:   No nausea, vomiting, diarrhea, bright red blood per rectum, melena, or hematemesis Neurologic:  No visual changes, wkns, changes in mental  status. All other systems reviewed and are otherwise negative except as noted above.  Physical Exam    Blood pressure (!) 150/96, pulse (!) 57, temperature 98.1 F (36.7 C), resp. rate 16, height 5\' 9"  (1.753 m), weight 112.4 kg, SpO2 97 %.  General: Pleasant, NAD Psych: Normal affect. Neuro: Alert and oriented X 3. Moves all  extremities spontaneously. HEENT: Normal  Neck: Supple without bruits or JVD. Lungs:  Resp regular and unlabored, CTA. Heart: RRR no s3, s4, or murmurs. Abdomen: Soft, non-tender, non-distended, BS + x 4.  Extremities: No clubbing, cyanosis or edema. DP/PT2+, Radials 2+ and equal bilaterally.  Labs    Cardiac Enzymes Recent Labs  Lab 12/17/21 1626 12/17/21 1935 12/17/21 2130 12/18/21 0114 12/18/21 0611  TROPONINIHS 50* 390* 831* 2,143* 3,885*      Lab Results  Component Value Date   WBC 9.1 12/18/2021   HGB 14.1 12/18/2021   HCT 42.1 12/18/2021   MCV 83.2 12/18/2021   PLT 265 12/18/2021    Recent Labs  Lab 12/16/21 0946 12/17/21 1433  NA 137 137  K 4.4 4.2  CL 102 106  CO2 30 21*  BUN 19 19  CREATININE 1.06 1.09  CALCIUM 9.1 8.9  PROT 6.6  --   BILITOT 0.4  --   ALKPHOS 77  --   ALT 35  --   AST 27  --   GLUCOSE 94 118*   Lab Results  Component Value Date   CHOL 208 (H) 12/18/2021   HDL 34 (L) 12/18/2021   LDLCALC 138 (H) 12/18/2021   TRIG 181 (H) 12/18/2021   Lab Results  Component Value Date   HGBA1C 5.9 12/16/2021    Radiology Studies    DG Chest 2 View  Result Date: 12/17/2021 CLINICAL DATA:  Chest and left shoulder pain. EXAM: CHEST - 2 VIEW COMPARISON:  Chest radiographs 03/17/2008 FINDINGS: The cardiomediastinal silhouette is unchanged with normal heart size. The lungs are hyperinflated and clear. No pleural effusion or pneumothorax is identified. No acute osseous abnormality is seen. IMPRESSION: No active cardiopulmonary disease. Electronically Signed   By: Sebastian Ache M.D.   On: 12/17/2021 14:52    ECG & Cardiac  Imaging    SB, 54, TWI I, aVL, V1-2 - personally reviewed.  Assessment & Plan    1.  NSTEMI:  Pt w/o prior cardiac hx.  Both grandfathers had MI's in their 54's.  He experienced intermittent exertional chest pressure while at work on 11/20 w/ recurrent, more severe chest pain on 11/22, after doing some light work outside.  He presented to the ED w/in 30 mins of onset of Ss.  HsTrop has risen from 20  50  390  831  2143  3885.  Currently on asa, heparin, low-dose metoprolol, lisinopril, and high potency statin (escalate).  I'm adding NTP this AM.  Currently pain free but has had intermittent mild pain overnight.  If recurrent c/p despite NTP, will change to gtt and will need to consider cath sooner rather than later.  Provided that he remains pain free today, will plan on cath tomorrow AM.  Discussed in detail w/ pt and wife this AM.  The patient understands that risks include but are not limited to stroke (1 in 1000), death (1 in 1000), kidney failure [usually temporary] (1 in 500), bleeding (1 in 200), allergic reaction [possibly serious] (1 in 200), and agrees to proceed.  Echo today.  2.  Essential HTN:  On lisinopril 10 @ home.  Pressures trending 150's. Titrating lisinopril to 20 daily.  Metoprolol 25 bid ordered and he's already received a dose this AM.  HRs trending low to mid-50's @ rest.  I will d/c metoprolol and replace w/ low dose carvedilol.  Watch HRs, may need to d/c altogether.  3.  HL:  LDL 138.  Statin naive.  Will  escalate lipitor to 80.  4.  Morbid Obesity:  Will need outpt cardiac rehab, dietary education, regular exercise.  Risk Assessment/Risk Scores:     TIMI Risk Score for Unstable Angina or Non-ST Elevation MI:   The patient's TIMI risk score is 3, which indicates a 13% risk of all cause mortality, new or recurrent myocardial infarction or need for urgent revascularization in the next 14 days.      Signed, Nicolasa Duckinghristopher Nanna Ertle, NP 12/18/2021, 8:44 AM  For questions or  updates, please contact   Please consult [email or web address] for contact info under Cardiology/STEMI.

## 2021-12-18 NOTE — Assessment & Plan Note (Signed)
--  LDL 138, not on statin PTA. --cont Lipitor 80 mg daily

## 2021-12-18 NOTE — Consult Note (Addendum)
ANTICOAGULATION CONSULT NOTE - Initial Consult  Pharmacy Consult for heparin Indication: ACS/STEMI  No Known Allergies  Patient Measurements: Height: 5\' 9"  (175.3 cm) Weight: 112.4 kg (247 lb 12.8 oz) IBW/kg (Calculated) : 70.7 Heparin Dosing Weight: 91.3  Vital Signs: Temp: 98 F (36.7 C) (11/23 0016) Temp Source: Oral (11/23 0016) BP: 151/92 (11/23 0016) Pulse Rate: 55 (11/23 0016)  Labs: Recent Labs    12/16/21 0946 12/17/21 1433 12/17/21 1433 12/17/21 1626 12/17/21 1750 12/17/21 1935 12/17/21 2130  HGB  --  14.9  --   --   --   --   --   HCT  --  45.5  --   --   --   --   --   PLT  --  273  --   --   --   --   --   APTT  --   --   --   --  33  --   --   LABPROT  --   --   --   --  12.8  --   --   INR  --   --   --   --  1.0  --   --   HEPARINUNFRC  --   --   --   --   --   --  0.14*  CREATININE 1.06 1.09  --   --   --   --   --   TROPONINIHS  --  20*   < > 50*  --  390* 831*   < > = values in this interval not displayed.     Estimated Creatinine Clearance: 96.9 mL/min (by C-G formula based on SCr of 1.09 mg/dL).   Medical History: Past Medical History:  Diagnosis Date   Hypertension 12-2005   Plantar fascia rupture 08/29/2010    Medications:  No chronic anticoagulation PTA from chart review  Assessment: 51 yo M presents with chest pain radiating to left shoulder along with neck and back. Pharmacy has been consulted to initiate and manage heparin ISO ACS/STEMI. Troponins trending up currently, from 20 >> 50. ECG shows nonspecific ST abnormality and sinus bradycardia. CXR negative for active cardiopulmonary disease. Hgb, Hct, PLT stable.  Date Time aPTT/HL Rate/Comment       Baseline Labs: aPTT - ordered; INR - ordered Hgb - 14.9; Plts - 273  Goal of Therapy:  Heparin level 0.3-0.7 units/ml Monitor platelets by anticoagulation protocol: Yes   Plan:  11/23: HL @ 0114 = 0.10, SUBtherapeutic  Will order heparin 2700 units IV X 1 bolus and increase  drip rate to 1500 units/hr. Will recheck HL 6 hrs after rate change.   Minda Faas D 12/18/2021 1:03 AM

## 2021-12-18 NOTE — Plan of Care (Signed)
  Problem: Education: Goal: Understanding of cardiac disease, CV risk reduction, and recovery process will improve Outcome: Progressing   Problem: Activity: Goal: Ability to tolerate increased activity will improve Outcome: Progressing   Problem: Cardiac: Goal: Ability to achieve and maintain adequate cardiovascular perfusion will improve Outcome: Progressing   

## 2021-12-18 NOTE — Progress Notes (Signed)
  PROGRESS NOTE    Eric Huff  WTU:[id number] DOB: 1970-10-21 DOA: 12/17/2021 PCP: Eustaquio Boyden, MD  [id number]  LOS: 1 day   Brief hospital course:   Assessment & Plan: Eric Huff is a 51 y.o. male with medical history significant for hypertension, obesity, gout who presents to the ER via private vehicle for evaluation of chest pain mostly over the left anterior chest wall with radiation to the left shoulder and down his left arm, up his neck and back.    * NSTEMI (non-ST elevated myocardial infarction) (HCC) --trop trended from 20 to >5000's  --cont heparin gtt --LHC tomorrow  Dyslipidemia --LDL 138, not on statin PTA. --cont Lipitor 80 mg daily   Severe obesity (BMI 35.0-39.9) with comorbidity (HCC) BMI 38.32 Complicates overall prognosis and care Lifestyle modification and exercise has been discussed with patient in detail  Essential hypertension --cont Lisinopril --change metop to coreg   DVT prophylaxis: ZP:[id number] gtt Code Status: Full code  Family Communication: wife updated at bedside today Level of care: Progressive Dispo:   The patient is from: home Anticipated d/c is to: home Anticipated d/c date is: 1-2 days   Subjective and Interval History:  No chest pain.     Objective: Vitals:   12/18/21 0418 12/18/21 0724 12/18/21 1115 12/18/21 1637  BP: 120/70 (!) 150/96 124/85 106/71  Pulse: (!) 57 (!) 57 (!) 53 (!) 53  Resp: 16 16  16   Temp: 98.6 F (37 C) 98.1 F (36.7 C) 98.6 F (37 C) 98.4 F (36.9 C)  TempSrc: Oral     SpO2: 97% 97% 97% 97%  Weight:      Height:        Intake/Output Summary (Last 24 hours) at 12/18/2021 1909 Last data filed at 12/18/2021 1500 Gross per 24 hour  Intake 964.26 ml  Output --  Net 964.26 ml   Filed Weights   12/17/21 1423 12/17/21 1627 12/17/21 2101  Weight: 104.8 kg 114.3 kg 112.4 kg    Examination:   Constitutional: NAD, AAOx3 HEENT: conjunctivae and lids normal, EOMI CV: No  cyanosis.   RESP: normal respiratory effort, on RA Neuro: II - XII grossly intact.   Psych: Normal mood and affect.  Appropriate judgement and reason   Data Reviewed: I have personally reviewed labs and imaging studies  Time spent: 35 minutes  2102, MD [facility] If 7PM-7AM, please contact night-coverage 12/18/2021, 7:09 PM

## 2021-12-18 NOTE — Progress Notes (Signed)
*  PRELIMINARY RESULTS* Echocardiogram 2D Echocardiogram has been performed.  Eric Huff 12/18/2021, 10:01 AM

## 2021-12-18 NOTE — TOC Initial Note (Signed)
Transition of Care [facility]) - Initial/Assessment Note    Patient Details  Name: Eric Huff MRN: [id number] Date of Birth: [birth year]  Transition of Care [facility]) CM/SW Contact:    Allayne Butcher, RN Phone Number: 12/18/2021, 8:43 AM  Clinical Narrative:                   Transition of Care [facility]) Screening Note   Patient Details  Name: Eric Huff Date of Birth: 1970/02/29   Transition of Care [facility]) CM/SW Contact:    Allayne Butcher, RN Phone Number: 12/18/2021, 8:43 AM    Transition of Care Department [facility]) has reviewed patient and no TOC needs have been identified at this time. We will continue to monitor patient advancement through interdisciplinary progression rounds. If new patient transition needs arise, please place a TOC consult.         Patient Goals and CMS Choice        Expected Discharge Plan and Services                                                Prior Living Arrangements/Services                       Activities of Daily Living Home Assistive Devices/Equipment: None ADL Screening (condition at time of admission) Patient's cognitive ability adequate to safely complete daily activities?: No Is the patient deaf or have difficulty hearing?: No Does the patient have difficulty seeing, even when wearing glasses/contacts?: No Does the patient have difficulty concentrating, remembering, or making decisions?: No Patient able to express need for assistance with ADLs?: No Does the patient have difficulty dressing or bathing?: No Independently performs ADLs?: Yes (appropriate for developmental age) Does the patient have difficulty walking or climbing stairs?: No Weakness of Legs: None Weakness of Arms/Hands: None  Permission Sought/Granted                  Emotional Assessment              Admission diagnosis:  ACS (acute coronary syndrome) (HCC) [I24.9] Elevated troponin [R79.89] Chest pain, unspecified  type [R07.9] Patient Active Problem List   Diagnosis Date Noted   ACS (acute coronary syndrome) (HCC) 12/17/2021   Kidney stone 05/16/2020   Palpitations 05/16/2020   Tinea corporis 02/09/2020   Vitamin D deficiency 12/20/2019   Chronic gout 07/05/2019   Hypotestosteronism 11/02/2016   Health maintenance examination 10/19/2014   Dyslipidemia 04/17/2014   Severe obesity (BMI 35.0-39.9) with comorbidity (HCC) 03/23/2014   Erectile dysfunction 03/23/2014   GERD 03/21/2008   Essential hypertension 05/06/2006   Prediabetes 05/06/2006   PCP:  Eustaquio Boyden, MD Pharmacy:   [facility] - [place], [place] - [address] [place] [address] Phone: [phone] Fax: [phone]     Social Determinants of Health (SDOH) Interventions    Readmission Risk Interventions     No data to display

## 2021-12-18 NOTE — Consult Note (Signed)
ANTICOAGULATION CONSULT NOTE  Pharmacy Consult for heparin Indication: ACS/STEMI  No Known Allergies  Patient Measurements: Height: 5\' 9"  (175.3 cm) Weight: 112.4 kg (247 lb 12.8 oz) IBW/kg (Calculated) : 70.7 Heparin Dosing Weight: 91.3  Vital Signs: Temp: 98.1 F (36.7 C) (11/23 0724) Temp Source: Oral (11/23 0418) BP: 150/96 (11/23 0724) Pulse Rate: 57 (11/23 0724)  Labs: Recent Labs    12/16/21 0946 12/17/21 1433 12/17/21 1626 12/17/21 1750 12/17/21 1935 12/17/21 2130 12/18/21 0114 12/18/21 0611  HGB  --  14.9  --   --   --   --  14.1  --   HCT  --  45.5  --   --   --   --  42.1  --   PLT  --  273  --   --   --   --  265  --   APTT  --   --   --  33  --   --   --   --   LABPROT  --   --   --  12.8  --   --   --   --   INR  --   --   --  1.0  --   --   --   --   HEPARINUNFRC  --   --   --   --   --  0.14* 0.10*  --   CREATININE 1.06 1.09  --   --   --   --   --   --   TROPONINIHS  --  20*   < >  --    < > 831* 2,143* 3,885*   < > = values in this interval not displayed.     Estimated Creatinine Clearance: 96.9 mL/min (by C-G formula based on SCr of 1.09 mg/dL).   Medical History: Past Medical History:  Diagnosis Date   Gout    Hypertension 12/26/2005   Morbid obesity (HCC)    Plantar fascia rupture 08/29/2010   Tobacco use    a. Smokes 1 cigar daily.    Medications:  No chronic anticoagulation PTA from chart review  Assessment: 51 yo M presents with chest pain radiating to left shoulder along with neck and back. Pharmacy has been consulted to initiate and manage heparin ISO ACS/STEMI. Troponins trending up currently, from 20 >> 50. ECG shows nonspecific ST abnormality and sinus bradycardia. CXR negative for active cardiopulmonary disease. Hgb, Hct, PLT stable.      Baseline Labs: aPTT 33s; INR 1.0 Hgb - 14.9; Plts - 273  Goal of Therapy:  Heparin level 0.3-0.7 units/ml Monitor platelets by anticoagulation protocol: Yes   Plan: heparin level  slightly subtherapeutic ---increase infusion rate to 1700 units/hr. ---Will recheck HL 6 hrs after rate change ---daily CBC while on IV heparin  40 12/18/2021 8:56 AM

## 2021-12-18 NOTE — Consult Note (Signed)
ANTICOAGULATION CONSULT NOTE  Pharmacy Consult for heparin infusion Indication: ACS/STEMI  No Known Allergies  Patient Measurements: Height: 5\' 9"  (175.3 cm) Weight: 112.4 kg (247 lb 12.8 oz) IBW/kg (Calculated) : 70.7 Heparin Dosing Weight: 91.3kg  Vital Signs: Temp: 98.4 F (36.9 C) (11/23 1637) BP: 106/71 (11/23 1637) Pulse Rate: 53 (11/23 1637)  Labs: Recent Labs    12/16/21 0946 12/17/21 1433 12/17/21 1626 12/17/21 1750 12/17/21 1935 12/18/21 0114 12/18/21 0611 12/18/21 0922 12/18/21 1756  HGB  --  14.9  --   --   --  14.1  --   --   --   HCT  --  45.5  --   --   --  42.1  --   --   --   PLT  --  273  --   --   --  265  --   --   --   APTT  --   --   --  33  --   --   --   --   --   LABPROT  --   --   --  12.8  --   --   --   --   --   INR  --   --   --  1.0  --   --   --   --   --   HEPARINUNFRC  --   --   --   --    < > 0.10*  --  0.28* 0.29*  CREATININE 1.06 1.09  --   --   --   --   --   --   --   TROPONINIHS  --  20*   < >  --    < > 2,143* 3,885* 5,865*  --    < > = values in this interval not displayed.     Estimated Creatinine Clearance: 96.9 mL/min (by C-G formula based on SCr of 1.09 mg/dL).   Medical History: Past Medical History:  Diagnosis Date   Gout    Hypertension 12/26/2005   Morbid obesity (HCC)    Plantar fascia rupture 08/29/2010   Tobacco use    a. Smokes 1 cigar daily.    Medications:  No chronic anticoagulation PTA from chart review  Assessment: 51 yo M presents with chest pain radiating to left shoulder along with neck and back. Pharmacy has been consulted to initiate and manage heparin ISO ACS/STEMI. Troponins trending up currently, from 20 >> 50. ECG shows nonspecific ST abnormality and sinus bradycardia. CXR negative for active cardiopulmonary disease. Hgb, Hct, PLT stable.      Baseline Labs: aPTT 33s; INR 1.0 Hgb - 14.9; Plts - 273  Goal of Therapy:  Heparin level 0.3-0.7 units/ml Monitor platelets by  anticoagulation protocol: Yes   Date Time HL Rate/Comment 11/23 0114 0.10 1200 > 1500  Subtherapeutic x1 11/23 0922 0.28 1500 > 1700  Subtherapeutic x2 11/23 1756 0.29 1700 > 1900  Subtherapeutic x3  Plan:  Give 3000 units bolus x1; then increase heparin infusion at 1900 units/hr Check anti-Xa level in 6 hours and daily once consecutively therapeutic. Continue to monitor H&H and platelets daily while on heparin gtt.  12/23 12/18/2021 6:57 PM

## 2021-12-18 NOTE — H&P (View-Only) (Signed)
Cardiology Consult    Patient ID: NAI DASCH MRN: [id number], DOB/AGE: 1970/10/18   Admit date: 12/17/2021 Date of Consult: 12/18/2021  Primary Physician: Eustaquio Boyden, MD Primary Cardiologist: New Requesting Provider: Laurette Schimke, MD  Patient Profile    Eric Huff is a 51 y.o. male with a history of HTN, obesity, tob abuse, and gout, who is being seen today for the evaluation of chest pain and NSTEMI at the request of Dr. Fran Lowes.  Past Medical History   Past Medical History:  Diagnosis Date   Gout    Hypertension 12/26/2005   Morbid obesity (HCC)    Plantar fascia rupture 08/29/2010   Tobacco use    a. Smokes 1 cigar daily.    Past Surgical History:  Procedure Laterality Date   KNEE ARTHROSCOPY     Left and right acl    ORIF ELBOW FRACTURE     left   ORIF FEMUR FRACTURE     left   REPLACEMENT TOTAL KNEE     left   SHOULDER SURGERY  2003     Allergies  No Known Allergies  History of Present Illness    51 y/o ? w/ a h/o HTN, Gout, tob abuse, and morbid obesity.  No prior cardiac hx.  He lives locally w/ his wife.  Two grown children.  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  Fairly active @ work - a lot of physical exertion loading/unloading truck.  Otw, does not routinely exercise.  Prev smoked cigarettes but quit in 2009.  Now smoking a cigar daily.  He was in his USOH until the night of 11/20, when while @ work, he noted exertional left upper chest pressure and heaviness w/o assoc symptoms.  This would occur every time that he walked, last about 10 mins, and resolve w/ rest.  He had Tuesday night off, and just took it easy w/o recurrent symptoms.  On Wednesday 11/22, he had just blown leaves off of his deck w/o symptoms, but upon getting ready to sit in his recliner to take a nap prior to work, he developed severe left upper chest and shoulder pain associated w/ diaphoresis.  Symptoms persisted prompting him to drive to [facility] w/in 30 mins of onset.  Here, ECG  showed sinus brady w/ TWI in I, aVL, V1, and Vw.  Initial HsTrop was 20 and has subsequently trended up: 20  50  390  831  2143  3885.  He was eventually given ASA and placed on heparin, ? blocker, and high potency statin.  Since admission, he has had mild, intermittent left chest pressure w/o associated Ss.  ECG w/o acute changes this AM.  Currently chest pain free.  Wife @ bedside.  Inpatient Medications     aspirin EC  81 mg Oral Daily   atorvastatin  40 mg Oral Daily   lisinopril  10 mg Oral Once   [START ON 12/19/2021] lisinopril  20 mg Oral Daily   metoprolol tartrate  25 mg Oral BID   nitroGLYCERIN  1 inch Topical Q6H   sodium chloride flush  3 mL Intravenous Q12H  Heparin gtt  Family History    Family History  Problem Relation Age of Onset   Hypertension Father    CAD Maternal Grandfather 28       massive MI   CAD Paternal Grandfather        MI and CABG   Hypertension Paternal Grandmother    Diabetes Paternal Grandmother  Hypertension Cousin    Cancer Maternal Grandmother        breast   Cancer Other 5       niece with rhabdomyosarcoma   He indicated that his mother is alive. He indicated that his father is alive. He indicated that his brother is alive. He indicated that the status of his maternal grandmother is unknown. He indicated that his maternal grandfather is deceased. He indicated that the status of his paternal grandmother is unknown. He indicated that his paternal grandfather is alive. He indicated that the status of his cousin is unknown. He indicated that the status of his other is unknown.   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  Now smokes a cigar daily.  Substance and Sexual Activity   Alcohol use: No    Alcohol/week: 0.0 standard drinks of alcohol   Drug use: No   Sexual activity: Not on file  Other Topics Concern   Not on file  Social History Narrative   "[name]"   Lives with wife, youngest daughter, 2 dogs, bearded dragon   Occupation: Works as a Transport planner, second shift   Activity: no regular exercise, some yardwork    Diet: good water, fruits/vegetables daily, smaller portions more frequency   Social Determinants of Corporate investment banker Strain: Not on file  Food Insecurity: No Food Insecurity (12/17/2021)   Hunger Vital Sign    Worried About Running Out of Food in the Last Year: Never true    Ran Out of Food in the Last Year: Never true  Transportation Needs: No Transportation Needs (12/17/2021)   PRAPARE - Administrator, Civil Service (Medical): No    Lack of Transportation (Non-Medical): No  Physical Activity: Not on file  Stress: Not on file  Social Connections: Not on file  Intimate Partner Violence: Unknown (12/17/2021)   Humiliation, Afraid, Rape, and Kick questionnaire    Fear of Current or Ex-Partner: No    Emotionally Abused: No    Physically Abused: Not on file    Sexually Abused: No     Review of Systems    General:  No chills, fever, night sweats or weight changes.  Cardiovascular:  +++ chest pain, no dyspnea on exertion, edema, orthopnea, palpitations, paroxysmal nocturnal dyspnea.  Noted occas palpitation in the setting of outdoor activity and presumed dehydration over the Summer. Dermatological: No rash, lesions/masses Respiratory: No cough, dyspnea Urologic: No hematuria, dysuria Abdominal:   No nausea, vomiting, diarrhea, bright red blood per rectum, melena, or hematemesis Neurologic:  No visual changes, wkns, changes in mental  status. All other systems reviewed and are otherwise negative except as noted above.  Physical Exam    Blood pressure (!) 150/96, pulse (!) 57, temperature 98.1 F (36.7 C), resp. rate 16, height 5\' 9"  (1.753 m), weight 112.4 kg, SpO2 97 %.  General: Pleasant, NAD Psych: Normal affect. Neuro: Alert and oriented X 3. Moves all  extremities spontaneously. HEENT: Normal  Neck: Supple without bruits or JVD. Lungs:  Resp regular and unlabored, CTA. Heart: RRR no s3, s4, or murmurs. Abdomen: Soft, non-tender, non-distended, BS + x 4.  Extremities: No clubbing, cyanosis or edema. DP/PT2+, Radials 2+ and equal bilaterally.  Labs    Cardiac Enzymes Recent Labs  Lab 12/17/21 1626 12/17/21 1935 12/17/21 2130 12/18/21 0114 12/18/21 0611  TROPONINIHS 50* 390* 831* 2,143* 3,885*      Lab Results  Component Value Date   WBC 9.1 12/18/2021   HGB 14.1 12/18/2021   HCT 42.1 12/18/2021   MCV 83.2 12/18/2021   PLT 265 12/18/2021    Recent Labs  Lab 12/16/21 0946 12/17/21 1433  NA 137 137  K 4.4 4.2  CL 102 106  CO2 30 21*  BUN 19 19  CREATININE 1.06 1.09  CALCIUM 9.1 8.9  PROT 6.6  --   BILITOT 0.4  --   ALKPHOS 77  --   ALT 35  --   AST 27  --   GLUCOSE 94 118*   Lab Results  Component Value Date   CHOL 208 (H) 12/18/2021   HDL 34 (L) 12/18/2021   LDLCALC 138 (H) 12/18/2021   TRIG 181 (H) 12/18/2021   Lab Results  Component Value Date   HGBA1C 5.9 12/16/2021    Radiology Studies    DG Chest 2 View  Result Date: 12/17/2021 CLINICAL DATA:  Chest and left shoulder pain. EXAM: CHEST - 2 VIEW COMPARISON:  Chest radiographs 03/17/2008 FINDINGS: The cardiomediastinal silhouette is unchanged with normal heart size. The lungs are hyperinflated and clear. No pleural effusion or pneumothorax is identified. No acute osseous abnormality is seen. IMPRESSION: No active cardiopulmonary disease. Electronically Signed   By: Sebastian Ache M.D.   On: 12/17/2021 14:52    ECG & Cardiac  Imaging    SB, 54, TWI I, aVL, V1-2 - personally reviewed.  Assessment & Plan    1.  NSTEMI:  Pt w/o prior cardiac hx.  Both grandfathers had MI's in their 54's.  He experienced intermittent exertional chest pressure while at work on 11/20 w/ recurrent, more severe chest pain on 11/22, after doing some light work outside.  He presented to the ED w/in 30 mins of onset of Ss.  HsTrop has risen from 20  50  390  831  2143  3885.  Currently on asa, heparin, low-dose metoprolol, lisinopril, and high potency statin (escalate).  I'm adding NTP this AM.  Currently pain free but has had intermittent mild pain overnight.  If recurrent c/p despite NTP, will change to gtt and will need to consider cath sooner rather than later.  Provided that he remains pain free today, will plan on cath tomorrow AM.  Discussed in detail w/ pt and wife this AM.  The patient understands that risks include but are not limited to stroke (1 in 1000), death (1 in 1000), kidney failure [usually temporary] (1 in 500), bleeding (1 in 200), allergic reaction [possibly serious] (1 in 200), and agrees to proceed.  Echo today.  2.  Essential HTN:  On lisinopril 10 @ home.  Pressures trending 150's. Titrating lisinopril to 20 daily.  Metoprolol 25 bid ordered and he's already received a dose this AM.  HRs trending low to mid-50's @ rest.  I will d/c metoprolol and replace w/ low dose carvedilol.  Watch HRs, may need to d/c altogether.  3.  HL:  LDL 138.  Statin naive.  Will  escalate lipitor to 80.  4.  Morbid Obesity:  Will need outpt cardiac rehab, dietary education, regular exercise.  Risk Assessment/Risk Scores:     TIMI Risk Score for Unstable Angina or Non-ST Elevation MI:   The patient's TIMI risk score is 3, which indicates a 13% risk of all cause mortality, new or recurrent myocardial infarction or need for urgent revascularization in the next 14 days.      Signed, Hanan Moen, NP 12/18/2021, 8:44 AM  For questions or  updates, please contact   Please consult [email or web address] for contact info under Cardiology/STEMI.   

## 2021-12-19 ENCOUNTER — Other Ambulatory Visit (HOSPITAL_COMMUNITY): Payer: Self-pay

## 2021-12-19 ENCOUNTER — Other Ambulatory Visit: Payer: Self-pay

## 2021-12-19 ENCOUNTER — Encounter
Admission: EM | Disposition: A | Discharge status: Home / Self Care | Payer: Self-pay | Source: Home / Self Care | Attending: Hospitalist

## 2021-12-19 DIAGNOSIS — I214 Non-ST elevation (NSTEMI) myocardial infarction: Secondary | ICD-10-CM | POA: Diagnosis not present

## 2021-12-19 DIAGNOSIS — I2510 Atherosclerotic heart disease of native coronary artery without angina pectoris: Secondary | ICD-10-CM

## 2021-12-19 DIAGNOSIS — Z720 Tobacco use: Secondary | ICD-10-CM

## 2021-12-19 HISTORY — PX: LEFT HEART CATH AND CORONARY ANGIOGRAPHY: CATH118249

## 2021-12-19 HISTORY — PX: CORONARY STENT INTERVENTION: CATH118234

## 2021-12-19 LAB — CBC
HCT: 41.3 % (ref 39.0–52.0)
Hemoglobin: 13.4 g/dL (ref 13.0–17.0)
MCH: 27.3 pg (ref 26.0–34.0)
MCHC: 32.4 g/dL (ref 30.0–36.0)
MCV: 84.1 fL (ref 80.0–100.0)
Platelets: 250 10*3/uL (ref 150–400)
RBC: 4.91 MIL/uL (ref 4.22–5.81)
RDW: 13.5 % (ref 11.5–15.5)
WBC: 11.3 10*3/uL — ABNORMAL HIGH (ref 4.0–10.5)
nRBC: 0 % (ref 0.0–0.2)

## 2021-12-19 LAB — BASIC METABOLIC PANEL
Anion gap: 7 (ref 5–15)
BUN: 17 mg/dL (ref 6–20)
CO2: 24 mmol/L (ref 22–32)
Calcium: 8.5 mg/dL — ABNORMAL LOW (ref 8.9–10.3)
Chloride: 105 mmol/L (ref 98–111)
Creatinine, Ser: 1.03 mg/dL (ref 0.61–1.24)
GFR, Estimated: >60 mL/min (ref >60)
Glucose, Bld: 106 mg/dL — ABNORMAL HIGH (ref 70–99)
Potassium: 3.9 mmol/L (ref 3.5–5.1)
Sodium: 136 mmol/L (ref 135–145)

## 2021-12-19 LAB — HEPARIN LEVEL (UNFRACTIONATED): Heparin Unfractionated: 0.55 IU/mL (ref 0.30–0.70)

## 2021-12-19 LAB — MAGNESIUM: Magnesium: 1.8 mg/dL (ref 1.7–2.4)

## 2021-12-19 LAB — POCT ACTIVATED CLOTTING TIME
Activated Clotting Time: 227 seconds
Activated Clotting Time: 287 seconds

## 2021-12-19 SURGERY — LEFT HEART CATH AND CORONARY ANGIOGRAPHY
Anesthesia: Moderate Sedation

## 2021-12-19 MED ORDER — HEPARIN (PORCINE) IN NACL 1000-0.9 UT/500ML-% IV SOLN
INTRAVENOUS | Status: DC | PRN
  Administered 2021-12-19 (×2): 500 mL

## 2021-12-19 MED ORDER — SODIUM CHLORIDE 0.9 % IV SOLN
INTRAVENOUS | Status: DC | PRN
  Administered 2021-12-19: 250 mL via INTRAVENOUS

## 2021-12-19 MED ORDER — SODIUM CHLORIDE 0.9% FLUSH: 3.0000 mL | INTRAVENOUS | Status: DC | PRN

## 2021-12-19 MED ORDER — ASPIRIN 81 MG PO CHEW: 81.0000 mg | CHEWABLE_TABLET | ORAL | Status: DC

## 2021-12-19 MED ORDER — IOHEXOL 300 MG/ML  SOLN
INTRAMUSCULAR | Status: DC | PRN
  Administered 2021-12-19: 145 mL

## 2021-12-19 MED ORDER — VERAPAMIL HCL 2.5 MG/ML IV SOLN
INTRAVENOUS | Status: AC
  Filled 2021-12-19: qty 2

## 2021-12-19 MED ORDER — FENTANYL CITRATE (PF) 100 MCG/2ML IJ SOLN
INTRAMUSCULAR | Status: AC
  Filled 2021-12-19: qty 2

## 2021-12-19 MED ORDER — PRASUGREL HCL 10 MG PO TABS
ORAL_TABLET | ORAL | Status: DC | PRN
  Administered 2021-12-19: 60 mg via ORAL

## 2021-12-19 MED ORDER — HEPARIN SODIUM (PORCINE) 1000 UNIT/ML IJ SOLN
INTRAMUSCULAR | Status: AC
  Filled 2021-12-19: qty 10

## 2021-12-19 MED ORDER — SODIUM CHLORIDE 0.9% FLUSH: 3.0000 mL | Freq: Two times a day (BID) | INTRAVENOUS | Status: DC

## 2021-12-19 MED ORDER — ASPIRIN 81 MG PO TBEC: 81.0000 mg | DELAYED_RELEASE_TABLET | Freq: Every day | ORAL | Status: AC

## 2021-12-19 MED ORDER — SODIUM CHLORIDE 0.9 % WEIGHT BASED INFUSION: 1.0000 mL/kg/h | INTRAVENOUS | Status: DC

## 2021-12-19 MED ORDER — PRASUGREL HCL 10 MG PO TABS
ORAL_TABLET | ORAL | Status: AC
  Filled 2021-12-19: qty 6

## 2021-12-19 MED ORDER — NITROGLYCERIN 1 MG/10 ML FOR IR/CATH LAB
INTRA_ARTERIAL | Status: AC
  Filled 2021-12-19: qty 10

## 2021-12-19 MED ORDER — SODIUM CHLORIDE 0.9 % IV SOLN: 250.0000 mL | INTRAVENOUS | Status: DC | PRN

## 2021-12-19 MED ORDER — PRASUGREL HCL 10 MG PO TABS: 10.0000 mg | ORAL_TABLET | Freq: Every day | ORAL | Status: DC

## 2021-12-19 MED ORDER — HEPARIN SODIUM (PORCINE) 1000 UNIT/ML IJ SOLN
INTRAMUSCULAR | Status: DC | PRN
  Administered 2021-12-19: 2000 [IU] via INTRAVENOUS
  Administered 2021-12-19: 5000 [IU] via INTRAVENOUS
  Administered 2021-12-19: 6000 [IU] via INTRAVENOUS

## 2021-12-19 MED ORDER — NITROGLYCERIN 1 MG/10 ML FOR IR/CATH LAB
INTRA_ARTERIAL | Status: DC | PRN
  Administered 2021-12-19: 200 ug via INTRACORONARY

## 2021-12-19 MED ORDER — SODIUM CHLORIDE 0.9 % WEIGHT BASED INFUSION
3.0000 mL/kg/h | INTRAVENOUS | Status: DC
  Administered 2021-12-19: 3 mL/kg/h via INTRAVENOUS

## 2021-12-19 MED ORDER — SODIUM CHLORIDE 0.9 % IV SOLN: INTRAVENOUS | Status: DC

## 2021-12-19 MED ORDER — MIDAZOLAM HCL 2 MG/2ML IJ SOLN
INTRAMUSCULAR | Status: AC
  Filled 2021-12-19: qty 2

## 2021-12-19 MED ORDER — ATORVASTATIN CALCIUM 80 MG PO TABS: 80.0000 mg | ORAL_TABLET | Freq: Every day | ORAL | 2 refills | Status: DC

## 2021-12-19 MED ORDER — FENTANYL CITRATE (PF) 100 MCG/2ML IJ SOLN
INTRAMUSCULAR | Status: DC | PRN
  Administered 2021-12-19 (×2): 25 ug via INTRAVENOUS

## 2021-12-19 MED ORDER — MIDAZOLAM HCL 2 MG/2ML IJ SOLN
INTRAMUSCULAR | Status: DC | PRN
  Administered 2021-12-19 (×2): 1 mg via INTRAVENOUS

## 2021-12-19 MED ORDER — CARVEDILOL 3.125 MG PO TABS: 3.1250 mg | ORAL_TABLET | Freq: Two times a day (BID) | ORAL | 2 refills | Status: DC

## 2021-12-19 MED ORDER — VERAPAMIL HCL 2.5 MG/ML IV SOLN
INTRAVENOUS | Status: DC | PRN
  Administered 2021-12-19: 2.5 mg via INTRA_ARTERIAL

## 2021-12-19 MED ORDER — HEPARIN (PORCINE) IN NACL 1000-0.9 UT/500ML-% IV SOLN
INTRAVENOUS | Status: AC
  Filled 2021-12-19: qty 1000

## 2021-12-19 MED ORDER — PRASUGREL HCL 10 MG PO TABS: 10.0000 mg | ORAL_TABLET | Freq: Every day | ORAL | 11 refills | Status: DC

## 2021-12-19 SURGICAL SUPPLY — 20 items
BALLN TREK RX 2.5X12 (BALLOONS) ×1
BALLN ~~LOC~~ TREK NEO RX 2.5X15 (BALLOONS) IMPLANT
BALLN ~~LOC~~ TREK NEO RX 3.25X12 (BALLOONS) IMPLANT
BALLOON TREK RX 2.5X12 (BALLOONS) IMPLANT
CATH INFINITI 5FR JK (CATHETERS) IMPLANT
CATH LAUNCHER 6FR EBU3.5 (CATHETERS) IMPLANT
DEVICE RAD TR BAND REGULAR (VASCULAR PRODUCTS) IMPLANT
DRAPE BRACHIAL (DRAPES) IMPLANT
GLIDESHEATH SLEND SS 6F .021 (SHEATH) IMPLANT
GUIDEWIRE INQWIRE 1.5J.035X260 (WIRE) IMPLANT
INQWIRE 1.5J .035X260CM (WIRE) ×1
KIT ENCORE 26 ADVANTAGE (KITS) IMPLANT
PACK CARDIAC CATH (CUSTOM PROCEDURE TRAY) ×1 IMPLANT
PROTECTION STATION PRESSURIZED (MISCELLANEOUS) ×1
SET ATX SIMPLICITY (MISCELLANEOUS) IMPLANT
STATION PROTECTION PRESSURIZED (MISCELLANEOUS) IMPLANT
STENT ONYX FRONTIER 2.5X22 (Permanent Stent) IMPLANT
STENT ONYX FRONTIER 3.0X15 (Permanent Stent) IMPLANT
TUBING CIL FLEX 10 FLL-RA (TUBING) IMPLANT
WIRE RUNTHROUGH .014X180CM (WIRE) IMPLANT

## 2021-12-19 NOTE — Progress Notes (Signed)
Rounding Note    Patient Name: Eric Huff Date of Encounter: 12/19/2021  [facility] Cardiologist: Kirke Corin  Subjective   Cardiac catheterization was done via the right radial artery earlier today which showed severe two-vessel coronary artery disease with chronically occluded right coronary artery with well-developed left-to-right collaterals and severe stenosis affecting the proximal left circumflex as well as significant stenosis in the mid to distal left circumflex.  I felt that right coronary artery was chronically occluded and was not the culprit.  The left circumflex was the likely culprit and this was treated with PCI and 2 drug-eluting stent placement.  He is doing well with no chest pain or shortness of breath.  Inpatient Medications    Scheduled Meds:  aspirin EC  81 mg Oral Daily   atorvastatin  80 mg Oral Daily   carvedilol  3.125 mg Oral BID WC   lisinopril  20 mg Oral Daily   [START ON 12/20/2021] prasugrel  10 mg Oral Daily   sodium chloride flush  3 mL Intravenous Q12H   sodium chloride flush  3 mL Intravenous Q12H   Continuous Infusions:  sodium chloride Stopped (12/19/21 1531)   sodium chloride     PRN Meds: sodium chloride, acetaminophen, colchicine, nitroGLYCERIN, ondansetron (ZOFRAN) IV, sodium chloride flush   Vital Signs    Vitals:   12/19/21 1115 12/19/21 1130 12/19/21 1200 12/19/21 1230  BP: 119/81 124/83 131/77 126/82  Pulse: 63 67 67 (!) 59  Resp: 13 13 18 16   Temp:      TempSrc:      SpO2: 98% 98% 98% 97%  Weight:      Height:        Intake/Output Summary (Last 24 hours) at 12/19/2021 1543 Last data filed at 12/19/2021 1531 Gross per 24 hour  Intake 2056.49 ml  Output 200 ml  Net 1856.49 ml      12/19/2021    8:01 AM 12/17/2021    9:01 PM 12/17/2021    4:27 PM  Last 3 Weights  Weight (lbs) 249 lb 247 lb 12.8 oz 252 lb  Weight (kg) 112.946 kg 112.401 kg 114.306 kg      Telemetry    Normal sinus rhythm-  Personally Reviewed  ECG     - Personally Reviewed  Physical Exam   GEN: No acute distress.   Neck: No JVD Cardiac: RRR, no murmurs, rubs, or gallops.  Respiratory: Clear to auscultation bilaterally. GI: Soft, nontender, non-distended  MS: No edema; No deformity. Neuro:  Nonfocal  Psych: Normal affect  Right radial pulses normal with no hematoma.  Labs    High Sensitivity Troponin:   Recent Labs  Lab 12/17/21 1935 12/17/21 2130 12/18/21 0114 12/18/21 0611 12/18/21 0922  TROPONINIHS 390* 831* 2,143* 3,885* 5,865*     Chemistry Recent Labs  Lab 12/16/21 0946 12/17/21 1433 12/19/21 0536  NA 137 137 136  K 4.4 4.2 3.9  CL 102 106 105  CO2 30 21* 24  GLUCOSE 94 118* 106*  BUN 19 19 17   CREATININE 1.06 1.09 1.03  CALCIUM 9.1 8.9 8.5*  MG  --   --  1.8  PROT 6.6  --   --   ALBUMIN 4.2  --   --   AST 27  --   --   ALT 35  --   --   ALKPHOS 77  --   --   BILITOT 0.4  --   --   GFRNONAA  --  >  60 >60  ANIONGAP  --  10 7    Lipids  Recent Labs  Lab 12/18/21 0114  CHOL 208*  TRIG 181*  HDL 34*  LDLCALC 138*  CHOLHDL 6.1    Hematology Recent Labs  Lab 12/17/21 1433 12/18/21 0114 12/19/21 0536  WBC 7.5 9.1 11.3*  RBC 5.41 5.06 4.91  HGB 14.9 14.1 13.4  HCT 45.5 42.1 41.3  MCV 84.1 83.2 84.1  MCH 27.5 27.9 27.3  MCHC 32.7 33.5 32.4  RDW 13.6 13.7 13.5  PLT 273 265 250   Thyroid No results for input(s): "TSH", "FREET4" in the last 168 hours.  BNPNo results for input(s): "BNP", "PROBNP" in the last 168 hours.  DDimer No results for input(s): "DDIMER" in the last 168 hours.   Radiology    CARDIAC CATHETERIZATION  Result Date: 12/19/2021   Prox RCA to Mid RCA lesion is 100% stenosed.   Mid LAD-1 lesion is 30% stenosed.   Mid LAD-2 lesion is 30% stenosed.   Mid Cx to Dist Cx lesion is 80% stenosed.   Prox Cx to Mid Cx lesion is 95% stenosed.   3rd Mrg lesion is 30% stenosed.   Mid Cx lesion is 30% stenosed.   A drug-eluting stent was successfully  placed using a STENT ONYX FRONTIER 2.5X22.   A drug-eluting stent was successfully placed using a STENT ONYX FRONTIER 3.0X15.   Post intervention, there is a 0% residual stenosis.   Post intervention, there is a 0% residual stenosis. 1.  Severe two-vessel coronary artery disease with chronically occluded right coronary artery with well-developed left-to-right collaterals and severe stenosis affecting the left circumflex which is the likely culprit for non-ST elevation myocardial infarction.  The LAD itself has mild to moderate nonobstructive disease. 2.  Left ventricular angiography was not performed.  EF was normal by echo.  High normal left ventricular end-diastolic pressure 3.  Successful angioplasty and drug-eluting stent placement to the mid/distal left circumflex as well as proximal left circumflex.  The stents were not overlapped. Recommendations: Dual antiplatelet therapy for 12 months. Aggressive medical therapy. Possible discharge home in the afternoon or tomorrow morning.   ECHOCARDIOGRAM COMPLETE  Result Date: 12/18/2021    ECHOCARDIOGRAM REPORT   Patient Name:   Eric Huff [facility] Date of Exam: 12/18/2021 Medical Rec #:  [id number]         Height:       69.0 in Accession #:    [id number]        Weight:       247.8 lb Date of Birth:  04/28/1970         BSA:          2.263 m Patient Age:    51 years          BP:           120/70 mmHg Patient Gender: M                 HR:           58 bpm. Exam Location:  [facility] Procedure: 2D Echo, Color Doppler, Cardiac Doppler, Strain Analysis and            Intracardiac Opacification Agent Indications:     R07.9 Chest Pain  History:         Patient has no prior history of Echocardiogram examinations.                  Signs/Symptoms:Chest Pain; Risk Factors:Hypertension.  Sonographer:  Charmayne Sheer Referring Phys:  [place] Diagnosing Phys: Buford Dresser MD  Sonographer Comments: Suboptimal subcostal window. Global longitudinal strain was  attempted. IMPRESSIONS  1. Left ventricular ejection fraction, by estimation, is 55 to 60%. The left ventricle has normal function. The left ventricle has no regional wall motion abnormalities. Left ventricular diastolic parameters were normal.  2. Right ventricular systolic function is normal. The right ventricular size is normal.  3. The mitral valve is normal in structure. Trivial mitral valve regurgitation. No evidence of mitral stenosis.  4. The aortic valve is tricuspid. Aortic valve regurgitation is not visualized. No aortic stenosis is present.  5. The inferior vena cava is dilated in size with >50% respiratory variability, suggesting right atrial pressure of 8 mmHg. Comparison(s): No prior Echocardiogram. Conclusion(s)/Recommendation(s): Normal biventricular function without evidence of hemodynamically significant valvular heart disease. FINDINGS  Left Ventricle: Left ventricular ejection fraction, by estimation, is 55 to 60%. The left ventricle has normal function. The left ventricle has no regional wall motion abnormalities. Definity contrast agent was given IV to delineate the left ventricular  endocardial borders. The left ventricular internal cavity size was normal in size. There is borderline left ventricular hypertrophy. Left ventricular diastolic parameters were normal. Right Ventricle: The right ventricular size is normal. No increase in right ventricular wall thickness. Right ventricular systolic function is normal. Left Atrium: Left atrial size was normal in size. Right Atrium: Right atrial size was normal in size. Pericardium: There is no evidence of pericardial effusion. Mitral Valve: The mitral valve is normal in structure. Trivial mitral valve regurgitation. No evidence of mitral valve stenosis. Tricuspid Valve: The tricuspid valve is normal in structure. Tricuspid valve regurgitation is trivial. No evidence of tricuspid stenosis. Aortic Valve: The aortic valve is tricuspid. Aortic valve  regurgitation is not visualized. No aortic stenosis is present. Aortic valve mean gradient measures 3.0 mmHg. Aortic valve peak gradient measures 4.8 mmHg. Aortic valve area, by VTI measures 1.99 cm. Pulmonic Valve: The pulmonic valve was not well visualized. Pulmonic valve regurgitation is not visualized. No evidence of pulmonic stenosis. Aorta: The aortic root, ascending aorta and aortic arch are all structurally normal, with no evidence of dilitation or obstruction. Venous: The inferior vena cava is dilated in size with greater than 50% respiratory variability, suggesting right atrial pressure of 8 mmHg. IAS/Shunts: The atrial septum is grossly normal.  LEFT VENTRICLE PLAX 2D LVIDd:         4.70 cm      Diastology LVIDs:         3.60 cm      LV e' medial:    10.00 cm/s LV PW:         1.40 cm      LV E/e' medial:  7.8 LV IVS:        1.10 cm      LV e' lateral:   11.50 cm/s LVOT diam:     1.90 cm      LV E/e' lateral: 6.8 LV SV:         52 LV SV Index:   23 LVOT Area:     2.84 cm  LV Volumes (MOD) LV vol d, MOD A2C: 139.0 ml LV vol d, MOD A4C: 145.0 ml LV vol s, MOD A2C: 50.5 ml LV vol s, MOD A4C: 56.8 ml LV SV MOD A2C:     88.5 ml LV SV MOD A4C:     145.0 ml LV SV MOD BP:  91.1 ml RIGHT VENTRICLE RV Basal diam:  3.80 cm RV S prime:     11.20 cm/s TAPSE (M-mode): 2.1 cm LEFT ATRIUM             Index        RIGHT ATRIUM           Index LA diam:        4.30 cm 1.90 cm/m   RA Area:     19.50 cm LA Vol (A2C):   29.2 ml 12.90 ml/m  RA Volume:   57.00 ml  25.19 ml/m LA Vol (A4C):   26.5 ml 11.71 ml/m LA Biplane Vol: 28.8 ml 12.73 ml/m  AORTIC VALVE                    PULMONIC VALVE AV Area (Vmax):    2.16 cm     PV Vmax:       0.98 m/s AV Area (Vmean):   2.03 cm     PV Vmean:      71.600 cm/s AV Area (VTI):     1.99 cm     PV VTI:        0.200 m AV Vmax:           110.00 cm/s  PV Peak grad:  3.8 mmHg AV Vmean:          82.200 cm/s  PV Mean grad:  2.0 mmHg AV VTI:            0.259 m AV Peak Grad:      4.8  mmHg AV Mean Grad:      3.0 mmHg LVOT Vmax:         83.80 cm/s LVOT Vmean:        58.900 cm/s LVOT VTI:          0.182 m LVOT/AV VTI ratio: 0.70  AORTA Ao Root diam: 3.50 cm MITRAL VALVE MV Area (PHT): 3.15 cm    SHUNTS MV Decel Time: 241 msec    Systemic VTI:  0.18 m MV E velocity: 78.10 cm/s  Systemic Diam: 1.90 cm MV A velocity: 60.30 cm/s MV E/A ratio:  1.30 Buford Dresser MD Electronically signed by Buford Dresser MD Signature Date/Time: 12/18/2021/11:36:08 AM    Final     Cardiac Studies     Patient Profile     51 y.o. male past medical history of essential hypertension, obesity and tobacco use who presented with non-STEMI.  Assessment & Plan    1.  Non-ST elevation myocardial infarction: Cardiac catheterization showed significant two-vessel coronary artery disease with chronically occluded right coronary artery with collaterals.  The culprit was severe disease in the left circumflex which was treated successfully with PCI in 2 drug-eluting stent placement. Recommend dual antiplatelet therapy with aspirin indefinitely and prasugrel 10 mg daily for at least 12 months. I referred the patient to cardiac rehab. 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  He should remain off work for a minimal of 2 months.  2.  Essential hypertension: He takes lisinopril at home.  Small dose carvedilol was added during this admission.  3.  Hyperlipidemia: Continue high-dose atorvastatin 80 mg daily.  4.  Cigar use: I discussed the importance of smoking cessation.  Prolonged discussion with the patient and multiple family members were present at the bedside. The patient can be discharged home from a cardiac standpoint.  I discussed  with Dr. Billie Ruddy. I will arrange for the patient to follow-up in our office in 1 to 2 weeks.  Total encounter time more than 50 minutes. Greater than 50% was spent in counseling and coordination of care  with the patient.      For questions or updates, please contact [place] Please consult [email or web address] for contact info under        Signed, Kathlyn Sacramento, MD  12/19/2021, 3:43 PM

## 2021-12-19 NOTE — Consult Note (Signed)
ANTICOAGULATION CONSULT NOTE  Pharmacy Consult for heparin infusion Indication: ACS/STEMI  No Known Allergies  Patient Measurements: Height: 5\' 9"  (175.3 cm) Weight: 112.4 kg (247 lb 12.8 oz) IBW/kg (Calculated) : 70.7 Heparin Dosing Weight: 91.3kg  Vital Signs: Temp: 98.7 F (37.1 C) (11/23 2355) Temp Source: Oral (11/23 2355) BP: 90/59 (11/23 2355) Pulse Rate: 58 (11/23 2355)  Labs: Recent Labs    12/16/21 0946 12/17/21 1433 12/17/21 1626 12/17/21 1750 12/17/21 1935 12/18/21 0114 12/18/21 0611 12/18/21 0922 12/18/21 1756 12/19/21 0137  HGB  --  14.9  --   --   --  14.1  --   --   --   --   HCT  --  45.5  --   --   --  42.1  --   --   --   --   PLT  --  273  --   --   --  265  --   --   --   --   APTT  --   --   --  33  --   --   --   --   --   --   LABPROT  --   --   --  12.8  --   --   --   --   --   --   INR  --   --   --  1.0  --   --   --   --   --   --   HEPARINUNFRC  --   --   --   --    < > 0.10*  --  0.28* 0.29* 0.55  CREATININE 1.06 1.09  --   --   --   --   --   --   --   --   TROPONINIHS  --  20*   < >  --    < > 2,143* 3,885* 5,865*  --   --    < > = values in this interval not displayed.     Estimated Creatinine Clearance: 96.9 mL/min (by C-G formula based on SCr of 1.09 mg/dL).   Medical History: Past Medical History:  Diagnosis Date   Gout    Hypertension 12/26/2005   Morbid obesity (HCC)    Plantar fascia rupture 08/29/2010   Tobacco use    a. Smokes 1 cigar daily.    Medications:  No chronic anticoagulation PTA from chart review  Assessment: 51 yo M presents with chest pain radiating to left shoulder along with neck and back. Pharmacy has been consulted to initiate and manage heparin ISO ACS/STEMI. Troponins trending up currently, from 20 >> 50. ECG shows nonspecific ST abnormality and sinus bradycardia. CXR negative for active cardiopulmonary disease. Hgb, Hct, PLT stable.      Baseline Labs: aPTT 33s; INR 1.0 Hgb - 14.9; Plts -  273  Goal of Therapy:  Heparin level 0.3-0.7 units/ml Monitor platelets by anticoagulation protocol: Yes   Date Time HL Rate/Comment 11/23 0114 0.10 1200 > 1500  Subtherapeutic x1 11/23 0922 0.28 1500 > 1700  Subtherapeutic x2 11/23 1756 0.29 1700 > 1900  Subtherapeutic x3 11/24   0137    0.55      1900 ;  Therapeutic X 1  Plan:  11/24:  HL @ 0137 = 0.55, therapeutic X 1 Will continue pt on current rate and recheck HL on 11/24 @ 0800.  Continue to monitor H&H and platelets daily while  on heparin gtt.  Kacia Halley D 12/19/2021 2:42 AM

## 2021-12-19 NOTE — TOC Benefit Eligibility Note (Signed)
Patient Scientific laboratory technician completed.     The patient is currently admitted and upon discharge could be taking PRASUGREL.   The current 30 day co-pay is, $10.   The patient is insured through [employer].

## 2021-12-19 NOTE — Discharge Summary (Signed)
Physician Discharge Summary   Eric Huff  male DOB: 1970/03/20  WUJ:[id number]  PCP: Eustaquio Boyden, MD  Admit date: 12/17/2021 Discharge date: 12/19/2021  Admitted From: home Disposition:  home CODE STATUS: Full code  Discharge Instructions     AMB Referral to Cardiac Rehabilitation - Phase II   Complete by: As directed    Diagnosis: NSTEMI   After initial evaluation and assessments completed: Virtual Based Care may be provided alone or in conjunction with Phase 2 Cardiac Rehab based on patient barriers.: Yes   Intensive Cardiac Rehabilitation (ICR) [facility] location only OR Traditional Cardiac Rehabilitation (TCR) *If criteria for ICR are not met will enroll in TCR [facility] only): Yes   Diet - low sodium heart healthy   Complete by: As directed    Discharge instructions   Complete by: As directed    You received 2 cardiac stents.  You will be taking aspirin 81 mg and Effient together for 1 year.  You are started on Lipitor and Coreg.   Dr. Darlin Priestly [facility] Course:  For full details, please see H&P, progress notes, consult notes and ancillary notes.  Briefly,  Eric Huff is a 51 y.o. male with medical history significant for hypertension, obesity, gout who presents to the ER via private vehicle for evaluation of chest pain mostly over the left anterior chest wall with radiation to the left shoulder and down his left arm, up his neck and back.    * NSTEMI (non-ST elevated myocardial infarction) (HCC) --trop trended from 20 to >5000's  --received LHC with 2 stents to left circumflex --discharged on ASA 91 and Effient for 1 year. --f/u with outpatient cardiology in 1-2 weeks   Dyslipidemia --LDL 138, not on statin PTA. --started on Lipitor 80 mg daily    Severe obesity (BMI 35.0-39.9) with comorbidity (HCC) BMI 38.32 Complicates overall prognosis and care Lifestyle modification and exercise has been discussed with patient in detail    Essential hypertension --cont home Lisinopril --started on coreg   Discharge Diagnoses:  Principal Problem:   NSTEMI (non-ST elevated myocardial infarction) (HCC) Active Problems:   Essential hypertension   Severe obesity (BMI 35.0-39.9) with comorbidity (HCC)   Dyslipidemia   30 Day Unplanned Readmission Risk Score    Flowsheet Row ED to Hosp-Admission (Current) from 12/17/2021 in [facility]U  30 Day Unplanned Readmission Risk Score (%) 8.73 Filed at 12/19/2021 1200       This score is the patient's risk of an unplanned readmission within 30 days of being discharged (0 -100%). The score is based on dignosis, age, lab data, medications, orders, and past utilization.   Low:  0-14.9   Medium: 15-21.9   High: 22-29.9   Extreme: 30 and above         Discharge Instructions:  Allergies as of 12/19/2021   No Known Allergies      Medication List     TAKE these medications    aspirin EC 81 MG tablet Take 1 tablet (81 mg total) by mouth daily. Swallow whole.   atorvastatin 80 MG tablet Commonly known as: LIPITOR Take 1 tablet (80 mg total) by mouth daily.   CALCIUM-MAGNESIUM-ZINC PO Take 3 tablets by mouth daily.   carvedilol 3.125 MG tablet Commonly known as: COREG Take 1 tablet (3.125 mg total) by mouth 2 (two) times daily with a meal.   colchicine 0.6 MG tablet Take 1 tablet (0.6 mg total)  by mouth daily as needed. For gout flares, may take 2 tablets on the first day of flare   lisinopril 10 MG tablet Commonly known as: ZESTRIL TAKE 1 TABLET BY MOUTH DAILY   loratadine 10 MG tablet Commonly known as: CLARITIN Take 10 mg by mouth daily.   Naproxen Sodium 220 MG Caps Take 220 mg by mouth as needed.   prasugrel 10 MG Tabs tablet Commonly known as: EFFIENT Take 1 tablet (10 mg total) by mouth daily. Start taking on: December 20, 2021   Vitamin D3 125 MCG (5000 UT) Caps Take 1 capsule by mouth daily.         Follow-up  Information     Iran Ouch, MD Follow up.   Specialty: Cardiology Contact information: [address] [place] [address] [phone]                 No Known Allergies   The results of significant diagnostics from this hospitalization (including imaging, microbiology, ancillary and laboratory) are listed below for reference.   Consultations:   Procedures/Studies: CARDIAC CATHETERIZATION  Result Date: 12/19/2021   Prox RCA to Mid RCA lesion is 100% stenosed.   Mid LAD-1 lesion is 30% stenosed.   Mid LAD-2 lesion is 30% stenosed.   Mid Cx to Dist Cx lesion is 80% stenosed.   Prox Cx to Mid Cx lesion is 95% stenosed.   3rd Mrg lesion is 30% stenosed.   Mid Cx lesion is 30% stenosed.   A drug-eluting stent was successfully placed using a STENT ONYX FRONTIER 2.5X22.   A drug-eluting stent was successfully placed using a STENT ONYX FRONTIER 3.0X15.   Post intervention, there is a 0% residual stenosis.   Post intervention, there is a 0% residual stenosis. 1.  Severe two-vessel coronary artery disease with chronically occluded right coronary artery with well-developed left-to-right collaterals and severe stenosis affecting the left circumflex which is the likely culprit for non-ST elevation myocardial infarction.  The LAD itself has mild to moderate nonobstructive disease. 2.  Left ventricular angiography was not performed.  EF was normal by echo.  High normal left ventricular end-diastolic pressure 3.  Successful angioplasty and drug-eluting stent placement to the mid/distal left circumflex as well as proximal left circumflex.  The stents were not overlapped. Recommendations: Dual antiplatelet therapy for 12 months. Aggressive medical therapy. Possible discharge home in the afternoon or tomorrow morning.   ECHOCARDIOGRAM COMPLETE  Result Date: 12/18/2021    ECHOCARDIOGRAM REPORT   Patient Name:   Eric Huff [facility] Date of Exam: 12/18/2021 Medical Rec #:   [id number]         Height:       69.0 in Accession #:    [id number]        Weight:       247.8 lb Date of Birth:  March 02, 1970         BSA:          2.263 m Patient Age:    51 years          BP:           120/70 mmHg Patient Gender: M                 HR:           58 bpm. Exam Location:  [facility] Procedure: 2D Echo, Color Doppler, Cardiac Doppler, Strain Analysis and            Intracardiac Opacification  Agent Indications:     R07.9 Chest Pain  History:         Patient has no prior history of Echocardiogram examinations.                  Signs/Symptoms:Chest Pain; Risk Factors:Hypertension.  Sonographer:     Humphrey Rolls Referring Phys:  [id number] Lucile Shutters Diagnosing Phys: Jodelle Red MD  Sonographer Comments: Suboptimal subcostal window. Global longitudinal strain was attempted. IMPRESSIONS  1. Left ventricular ejection fraction, by estimation, is 55 to 60%. The left ventricle has normal function. The left ventricle has no regional wall motion abnormalities. Left ventricular diastolic parameters were normal.  2. Right ventricular systolic function is normal. The right ventricular size is normal.  3. The mitral valve is normal in structure. Trivial mitral valve regurgitation. No evidence of mitral stenosis.  4. The aortic valve is tricuspid. Aortic valve regurgitation is not visualized. No aortic stenosis is present.  5. The inferior vena cava is dilated in size with >50% respiratory variability, suggesting right atrial pressure of 8 mmHg. Comparison(s): No prior Echocardiogram. Conclusion(s)/Recommendation(s): Normal biventricular function without evidence of hemodynamically significant valvular heart disease. FINDINGS  Left Ventricle: Left ventricular ejection fraction, by estimation, is 55 to 60%. The left ventricle has normal function. The left ventricle has no regional wall motion abnormalities. Definity contrast agent was given IV to delineate the left ventricular  endocardial borders. The left  ventricular internal cavity size was normal in size. There is borderline left ventricular hypertrophy. Left ventricular diastolic parameters were normal. Right Ventricle: The right ventricular size is normal. No increase in right ventricular wall thickness. Right ventricular systolic function is normal. Left Atrium: Left atrial size was normal in size. Right Atrium: Right atrial size was normal in size. Pericardium: There is no evidence of pericardial effusion. Mitral Valve: The mitral valve is normal in structure. Trivial mitral valve regurgitation. No evidence of mitral valve stenosis. Tricuspid Valve: The tricuspid valve is normal in structure. Tricuspid valve regurgitation is trivial. No evidence of tricuspid stenosis. Aortic Valve: The aortic valve is tricuspid. Aortic valve regurgitation is not visualized. No aortic stenosis is present. Aortic valve mean gradient measures 3.0 mmHg. Aortic valve peak gradient measures 4.8 mmHg. Aortic valve area, by VTI measures 1.99 cm. Pulmonic Valve: The pulmonic valve was not well visualized. Pulmonic valve regurgitation is not visualized. No evidence of pulmonic stenosis. Aorta: The aortic root, ascending aorta and aortic arch are all structurally normal, with no evidence of dilitation or obstruction. Venous: The inferior vena cava is dilated in size with greater than 50% respiratory variability, suggesting right atrial pressure of 8 mmHg. IAS/Shunts: The atrial septum is grossly normal.  LEFT VENTRICLE PLAX 2D LVIDd:         4.70 cm      Diastology LVIDs:         3.60 cm      LV e' medial:    10.00 cm/s LV PW:         1.40 cm      LV E/e' medial:  7.8 LV IVS:        1.10 cm      LV e' lateral:   11.50 cm/s LVOT diam:     1.90 cm      LV E/e' lateral: 6.8 LV SV:         52 LV SV Index:   23 LVOT Area:     2.84 cm  LV Volumes (MOD) LV vol d, MOD  A2C: 139.0 ml LV vol d, MOD A4C: 145.0 ml LV vol s, MOD A2C: 50.5 ml LV vol s, MOD A4C: 56.8 ml LV SV MOD A2C:     88.5 ml LV SV  MOD A4C:     145.0 ml LV SV MOD BP:      91.1 ml RIGHT VENTRICLE RV Basal diam:  3.80 cm RV S prime:     11.20 cm/s TAPSE (M-mode): 2.1 cm LEFT ATRIUM             Index        RIGHT ATRIUM           Index LA diam:        4.30 cm 1.90 cm/m   RA Area:     19.50 cm LA Vol (A2C):   29.2 ml 12.90 ml/m  RA Volume:   57.00 ml  25.19 ml/m LA Vol (A4C):   26.5 ml 11.71 ml/m LA Biplane Vol: 28.8 ml 12.73 ml/m  AORTIC VALVE                    PULMONIC VALVE AV Area (Vmax):    2.16 cm     PV Vmax:       0.98 m/s AV Area (Vmean):   2.03 cm     PV Vmean:      71.600 cm/s AV Area (VTI):     1.99 cm     PV VTI:        0.200 m AV Vmax:           110.00 cm/s  PV Peak grad:  3.8 mmHg AV Vmean:          82.200 cm/s  PV Mean grad:  2.0 mmHg AV VTI:            0.259 m AV Peak Grad:      4.8 mmHg AV Mean Grad:      3.0 mmHg LVOT Vmax:         83.80 cm/s LVOT Vmean:        58.900 cm/s LVOT VTI:          0.182 m LVOT/AV VTI ratio: 0.70  AORTA Ao Root diam: 3.50 cm MITRAL VALVE MV Area (PHT): 3.15 cm    SHUNTS MV Decel Time: 241 msec    Systemic VTI:  0.18 m MV E velocity: 78.10 cm/s  Systemic Diam: 1.90 cm MV A velocity: 60.30 cm/s MV E/A ratio:  1.30 Jodelle Red MD Electronically signed by Jodelle Red MD Signature Date/Time: 12/18/2021/11:36:08 AM    Final    DG Chest 2 View  Result Date: 12/17/2021 CLINICAL DATA:  Chest and left shoulder pain. EXAM: CHEST - 2 VIEW COMPARISON:  Chest radiographs 03/17/2008 FINDINGS: The cardiomediastinal silhouette is unchanged with normal heart size. The lungs are hyperinflated and clear. No pleural effusion or pneumothorax is identified. No acute osseous abnormality is seen. IMPRESSION: No active cardiopulmonary disease. Electronically Signed   By: Sebastian Ache M.D.   On: 12/17/2021 14:52      Labs: BNP (last 3 results) No results for input(s): "BNP" in the last 8760 hours. Basic Metabolic Panel: Recent Labs  Lab 12/16/21 0946 12/17/21 1433 12/19/21 0536   NA 137 137 136  K 4.4 4.2 3.9  CL 102 106 105  CO2 30 21* 24  GLUCOSE 94 118* 106*  BUN 19 19 17   CREATININE 1.06 1.09 1.03  CALCIUM 9.1 8.9 8.5*  MG  --   --  1.8   Liver Function Tests: Recent Labs  Lab 12/16/21 0946  AST 27  ALT 35  ALKPHOS 77  BILITOT 0.4  PROT 6.6  ALBUMIN 4.2   No results for input(s): "LIPASE", "AMYLASE" in the last 168 hours. No results for input(s): "AMMONIA" in the last 168 hours. CBC: Recent Labs  Lab 12/17/21 1433 12/18/21 0114 12/19/21 0536  WBC 7.5 9.1 11.3*  HGB 14.9 14.1 13.4  HCT 45.5 42.1 41.3  MCV 84.1 83.2 84.1  PLT 273 265 250   Cardiac Enzymes: No results for input(s): "CKTOTAL", "CKMB", "CKMBINDEX", "TROPONINI" in the last 168 hours. BNP: Invalid input(s): "POCBNP" CBG: No results for input(s): "GLUCAP" in the last 168 hours. D-Dimer No results for input(s): "DDIMER" in the last 72 hours. Hgb A1c No results for input(s): "HGBA1C" in the last 72 hours. Lipid Profile Recent Labs    12/18/21 0114  CHOL 208*  HDL 34*  LDLCALC 138*  TRIG 181*  CHOLHDL 6.1   Thyroid function studies No results for input(s): "TSH", "T4TOTAL", "T3FREE", "THYROIDAB" in the last 72 hours.  Invalid input(s): "FREET3" Anemia work up No results for input(s): "VITAMINB12", "FOLATE", "FERRITIN", "TIBC", "IRON", "RETICCTPCT" in the last 72 hours. Urinalysis    Component Value Date/Time   BILIRUBINUR negative 05/16/2020 1207   PROTEINUR Negative 05/16/2020 1207   UROBILINOGEN 0.2 05/16/2020 1207   NITRITE negative 05/16/2020 1207   LEUKOCYTESUR Negative 05/16/2020 1207   Sepsis Labs Recent Labs  Lab 12/17/21 1433 12/18/21 0114 12/19/21 0536  WBC 7.5 9.1 11.3*   Microbiology No results found for this or any previous visit (from the past 240 hour(s)).   Total time spend on discharging this patient, including the last patient exam, discussing the hospital stay, instructions for ongoing care as it relates to all pertinent  caregivers, as well as preparing the medical discharge records, prescriptions, and/or referrals as applicable, is 35 minutes.    Darlin Priestly, MD  [facility] 12/19/2021, 3:11 PM

## 2021-12-19 NOTE — Interval H&P Note (Signed)
Cath Lab Visit (complete for each Cath Lab visit)  Clinical Evaluation Leading to the Procedure:   ACS: Yes.    Non-ACS:  n/a   History and Physical Interval Note:  12/19/2021 9:26 AM  Eric Huff  has presented today for surgery, with the diagnosis of nstemi.  The various methods of treatment have been discussed with the patient and family. After consideration of risks, benefits and other options for treatment, the patient has consented to  Procedure(s): LEFT HEART CATH AND CORONARY ANGIOGRAPHY (N/A) as a surgical intervention.  The patient's history has been reviewed, patient examined, no change in status, stable for surgery.  I have reviewed the patient's chart and labs.  Questions were answered to the patient's satisfaction.     [name]

## 2021-12-20 LAB — LIPOPROTEIN A (LPA): Lipoprotein (a): 113.8 nmol/L — ABNORMAL HIGH (ref <75.0)

## 2021-12-22 ENCOUNTER — Telehealth: Payer: Self-pay | Admitting: Cardiovascular Disease

## 2021-12-22 ENCOUNTER — Encounter: Payer: Self-pay | Admitting: Cardiovascular Disease

## 2021-12-22 NOTE — Telephone Encounter (Signed)
Patient has been made aware.

## 2021-12-22 NOTE — Telephone Encounter (Signed)
Pt c/o medication issue:  1. Name of Medication: Gout Medication   2. How are you currently taking this medication (dosage and times per day)? Na   3. Are you having a reaction (difficulty breathing--STAT)? na  4. What is your medication issue? He just had a procedure Friday with Dr Kirke Corin and he is needing to see if there is some type of gout med he can take?  The gout is in the hand the procedure was done on.    Best number [phone]

## 2021-12-22 NOTE — Progress Notes (Signed)
Cardiology Office Note    Date:  12/26/2021   ID:  Coe, Angelos [birth year], MRN [id number]  PCP:  Eustaquio Boyden, MD  Cardiologist:  [name], MD  Electrophysiologist:  None   Chief Complaint: Hospital follow-up  History of Present Illness:   Eric Huff is a 51 y.o. male with history of newly diagnosed CAD in the setting of NSTEMI status post PCI to the LCx in 11/2021 as outlined below, HTN, tobacco use, gout, and obesity who presents for hospital follow-up as outlined below.  Prior to his admission, he had no previously known cardiac history.  He was admitted to the hospital from 11/22 through 12/19/2021 with an NSTEMI.  High-sensitivity troponin trended to 5865.  Echo showed an EF of 55 to 60%, no regional wall motion abnormalities, normal LV diastolic function parameters, normal RV systolic function and ventricular cavity size, trivial mitral regurgitation, and an estimated right atrial pressure of 8 mmHg.  LHC showed severe two-vessel CAD with chronically occluded RCA with well-developed left-to-right collaterals as well as severe stenosis affecting the LCx which was felt to be the likely culprit for his NSTEMI.  The LAD had mild to moderate nonobstructive disease.  High normal LVEDP.  He underwent successful PCI/DES to the mid to distal LCx as well as proximal LCx.  The stents were not overlapped.  Since his discharge, he was dealing with some gout involving improved with colchicine.  He comes in accompanied by his wife today and is doing well from a cardiac perspective.  No symptoms of angina or decompensation.  No dizziness, presyncope, or syncope.  He is adherent and tolerating cardiac medications without issues.  No falls, hematochezia, or melena.  He has not missed any doses of DAPT.  He has quit smoking cigars.  He will be participating with cardiac rehab.  He is working on improving his diet.   Labs independently reviewed: 11/2021 - magnesium 1.8,  potassium 3.9, BUN 17, serum creatinine 1.03, Hgb 13.4, PLT 250, LP(a) 113, TC 208, TG 181, HDL 34, LDL 138, albumin 4.2, AST/ALT normal, A1c 5.9  Past Medical History:  Diagnosis Date   Gout    Hypertension 12/26/2005   Morbid obesity (HCC)    Plantar fascia rupture 08/29/2010   Tobacco use    a. Smokes 1 cigar daily.    Past Surgical History:  Procedure Laterality Date   CORONARY STENT INTERVENTION N/A 12/19/2021   Procedure: CORONARY STENT INTERVENTION;  Surgeon: Iran Ouch, MD;  Location: [facility] INVASIVE CV LAB;  Service: Cardiovascular;  Laterality: N/A;   KNEE ARTHROSCOPY     Left and right acl    LEFT HEART CATH AND CORONARY ANGIOGRAPHY N/A 12/19/2021   Procedure: LEFT HEART CATH AND CORONARY ANGIOGRAPHY;  Surgeon: Iran Ouch, MD;  Location: [facility] INVASIVE CV LAB;  Service: Cardiovascular;  Laterality: N/A;   ORIF ELBOW FRACTURE     left   ORIF FEMUR FRACTURE     left   REPLACEMENT TOTAL KNEE     left   SHOULDER SURGERY  2003    Current Medications: Current Meds  Medication Sig   aspirin EC 81 MG tablet Take 1 tablet (81 mg total) by mouth daily. Swallow whole.   atorvastatin (LIPITOR) 80 MG tablet Take 1 tablet (80 mg total) by mouth daily.   CALCIUM-MAGNESIUM-ZINC PO Take 3 tablets by mouth daily.   carvedilol (COREG) 3.125 MG tablet Take 1 tablet (3.125 mg total) by mouth 2 (two) times daily  with a meal.   Cholecalciferol (VITAMIN D3) 125 MCG (5000 UT) CAPS Take 1 capsule by mouth daily.   colchicine 0.6 MG tablet Take 1 tablet (0.6 mg total) by mouth daily as needed. For gout flares, may take 2 tablets on the first day of flare   lisinopril (ZESTRIL) 10 MG tablet TAKE 1 TABLET BY MOUTH DAILY   loratadine (CLARITIN) 10 MG tablet Take 10 mg by mouth daily.   Naproxen Sodium 220 MG CAPS Take 220 mg by mouth as needed.   prasugrel (EFFIENT) 10 MG TABS tablet Take 1 tablet (10 mg total) by mouth daily.    Allergies:   Patient has no known allergies.    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  Now smokes a cigar daily.  Substance and Sexual Activity   Alcohol use: No    Alcohol/week: 0.0 standard drinks of alcohol   Drug use: No   Sexual activity: Not on file  Other Topics Concern   Not on file  Social History Narrative   "[name]"   Lives with wife, youngest daughter, 2 dogs, bearded dragon   Occupation: Works as a [employer] driver, second shift   Activity: no regular exercise, some yardwork    Diet: good water, fruits/vegetables daily, smaller portions more frequency   Social Determinants of Corporate investment bankerHealth   Financial Resource Strain: Not on file  Food Insecurity: No Food Insecurity (12/17/2021)   Hunger Vital Sign    Worried About Running Out of Food in the Last Year: Never true    Ran Out of Food in the Last Year: Never true  Transportation Needs: No Transportation Needs (12/17/2021)   PRAPARE - Administrator, Civil ServiceTransportation    Lack of Transportation (Medical): No    Lack of Transportation (Non-Medical): No  Physical Activity: Not on file  Stress: Not on file  Social Connections: Not on file     Family History:  The patient's family history includes CAD in his paternal grandfather; CAD (age of onset: 6154) in his maternal grandfather; Cancer in his maternal grandmother; Cancer (age of onset: 5) in an other family member; Diabetes in his paternal grandmother; Hypertension in his cousin, father, and paternal grandmother.  ROS:   12-point review of systems is negative unless otherwise noted in the HPI.   EKGs/Labs/Other Studies  Reviewed:    Studies reviewed were summarized above. The additional studies were reviewed today:  LHC 12/19/2021:   Prox RCA to Mid RCA lesion is 100% stenosed.   Mid LAD-1 lesion is 30% stenosed.   Mid LAD-2 lesion is 30% stenosed.   Mid Cx to Dist Cx lesion is 80% stenosed.   Prox Cx to Mid Cx lesion is 95% stenosed.   3rd Mrg lesion is 30% stenosed.   Mid Cx lesion is 30% stenosed.   A drug-eluting stent was successfully placed using a STENT ONYX FRONTIER 2.5X22.   A drug-eluting stent was successfully placed using a STENT ONYX FRONTIER 3.0X15.   Post intervention, there  is a 0% residual stenosis.   Post intervention, there is a 0% residual stenosis.   1.  Severe two-vessel coronary artery disease with chronically occluded right coronary artery with well-developed left-to-right collaterals and severe stenosis affecting the left circumflex which is the likely culprit for non-ST elevation myocardial infarction.  The LAD itself has mild to moderate nonobstructive disease. 2.  Left ventricular angiography was not performed.  EF was normal by echo.  High normal left ventricular end-diastolic pressure 3.  Successful angioplasty and drug-eluting stent placement to the mid/distal left circumflex as well as proximal left circumflex.  The stents were not overlapped.   Recommendations: Dual antiplatelet therapy for 12 months. Aggressive medical therapy. Possible discharge home in the afternoon or tomorrow morning. __________  2D echo 12/18/2021: 1. Left ventricular ejection fraction, by estimation, is 55 to 60%. The  left ventricle has normal function. The left ventricle has no regional  wall motion abnormalities. Left ventricular diastolic parameters were  normal.   2. Right ventricular systolic function is normal. The right ventricular  size is normal.   3. The mitral valve is normal in structure. Trivial mitral valve  regurgitation. No evidence of mitral stenosis.   4. The aortic valve  is tricuspid. Aortic valve regurgitation is not  visualized. No aortic stenosis is present.   5. The inferior vena cava is dilated in size with >50% respiratory  variability, suggesting right atrial pressure of 8 mmHg.   Comparison(s): No prior Echocardiogram.    EKG:  EKG is ordered today.  The EKG ordered today demonstrates NSR, 66 bpm, changes consistent with prior inferior infarct, consistent with prior tracing  Recent Labs: 12/16/2021: ALT 35 12/19/2021: BUN 17; Creatinine, Ser 1.03; Hemoglobin 13.4; Magnesium 1.8; Platelets 250; Potassium 3.9; Sodium 136  Recent Lipid Panel    Component Value Date/Time   CHOL 208 (H) 12/18/2021 0114   TRIG 181 (H) 12/18/2021 0114   HDL 34 (L) 12/18/2021 0114   CHOLHDL 6.1 12/18/2021 0114   VLDL 36 12/18/2021 0114   LDLCALC 138 (H) 12/18/2021 0114   LDLDIRECT 114.0 12/16/2021 0946    PHYSICAL EXAM:    VS:  BP 128/80 (BP Location: Left Arm, Patient Position: Sitting, Cuff Size: Large)   Pulse 66   Ht 5\' 9"  (1.753 m)   Wt 246 lb (111.6 kg)   SpO2 98%   BMI 36.33 kg/m   BMI: Body mass index is 36.33 kg/m.  Physical Exam Vitals reviewed.  Constitutional:      Appearance: He is well-developed.  HENT:     Head: Normocephalic and atraumatic.  Eyes:     General:        Right eye: No discharge.        Left eye: No discharge.  Neck:     Vascular: No JVD.  Cardiovascular:     Rate and Rhythm: Normal rate and regular rhythm.     Pulses:          Posterior tibial pulses are 2+ on the right side and 2+ on the left side.     Heart sounds: Normal heart sounds, S1 normal and S2 normal. Heart sounds not distant. No midsystolic click and no opening snap. No murmur heard.    No friction rub.     Comments: Right radial arteriotomy site is well-healing without active bleeding, bruising, swelling, warmth, erythema, or tenderness to palpation.  Radial pulse 2+ proximal and distal to the arteriotomy site. Pulmonary:     Effort: Pulmonary effort  is  normal. No respiratory distress.     Breath sounds: Normal breath sounds. No decreased breath sounds, wheezing or rales.  Chest:     Chest wall: No tenderness.  Abdominal:     General: There is no distension.     Palpations: Abdomen is soft.     Tenderness: There is no abdominal tenderness.  Musculoskeletal:     Cervical back: Normal range of motion.     Right lower leg: No edema.     Left lower leg: No edema.  Skin:    General: Skin is warm and dry.     Nails: There is no clubbing.  Neurological:     Mental Status: He is alert and oriented to person, place, and time.  Psychiatric:        Speech: Speech normal.        Behavior: Behavior normal.        Thought Content: Thought content normal.        Judgment: Judgment normal.     Wt Readings from Last 3 Encounters:  12/26/21 246 lb (111.6 kg)  12/19/21 249 lb (112.9 kg)  12/24/20 231 lb (104.8 kg)     ASSESSMENT & PLAN:   CAD involving the native coronary arteries with NSTEMI without angina: He is doing well and is without symptoms of angina or decompensation.Status post PCI/DES (nonoverlapping) to the proximal and mid/distal LCx.  He has CTO of the RCA with well-developed left to right collaterals as well as mild to moderate nonobstructive LAD disease as outlined above.  Continue DAPT with ASA and prasugrel interruption for a minimum of 12 months.  Importance of not missing DAPT was discussed.  Aggressive risk factor modification and secondary prevention including continuation of, and lisinopril.  Post-cath instructions.  Cardiac rehab.  Check CBC and BMP post cath.  HTN: Blood pressure is well-controlled in the office today.  He remains on carvedilol and losartan.  HLD: LDL 138 in 11/2021 with goal LDL being less than 55.  Now on atorvastatin 80 mg.  Follow-up fasting lipid panel and LFT in 2 months with recommendation to escalate lipid-lowering therapy as indicated to achieve target LDL.  Cigar use: Has quit.  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  He will need a treadmill stress test prior to returning to work.  EF normal by echo.    Disposition: F/u with Dr. Kirke Corin or an APP in 1 month.   Medication Adjustments/Labs and Tests Ordered: Current medicines are reviewed at length with the patient today.  Concerns regarding medicines are outlined above. Medication changes, Labs and Tests ordered today are summarized above and listed in the Patient Instructions accessible in Encounters.   Signed, Eula Listen, PA-C 12/26/2021 9:26 AM     [facility] [place] [address] [place], [place] [address] [phone]

## 2021-12-22 NOTE — Telephone Encounter (Signed)
Returned the call to the patient. He stated that he was advised by the pharmacy to not take his Colchicine while on the Effient. He feels like he is having a gout flair up on his right hand. He does not feel like it is related to the cath site. He stated that the cath site looks good, no drainage, no swelling, no numbness and no fever.   The right hand is painful in the joints and warm to touch. He stated that this is similar to his gout flare ups. He is currently taking tylenol for the pain.   Post procedure appointment made for 12/1 with Eula Listen, PA.

## 2021-12-22 NOTE — Telephone Encounter (Signed)
There is no interaction between Effient and colchicine and they can be taken together.  I am not really sure what the pharmacist is referring to.

## 2021-12-24 ENCOUNTER — Telehealth: Payer: Self-pay | Admitting: Cardiovascular Disease

## 2021-12-24 NOTE — Telephone Encounter (Signed)
Patient has [employer] forms to be completed - plus additional forms that patient will be bringing to appointment  Patient informed of fee and [facility] forms to be signed Placed by front desk

## 2021-12-26 ENCOUNTER — Other Ambulatory Visit
Admission: RE | Admit: 2021-12-26 | Discharge: 2021-12-26 | Disposition: A | Discharge status: Home / Self Care | Payer: BC Managed Care – PPO | Source: Ambulatory Visit | Attending: Physician Assistant | Admitting: Physician Assistant

## 2021-12-26 ENCOUNTER — Encounter: Payer: Self-pay | Admitting: Physician Assistant

## 2021-12-26 ENCOUNTER — Encounter: Payer: Self-pay | Admitting: Family Medicine

## 2021-12-26 ENCOUNTER — Ambulatory Visit: Payer: BC Managed Care – PPO | Attending: Physician Assistant | Admitting: Physician Assistant

## 2021-12-26 ENCOUNTER — Ambulatory Visit (INDEPENDENT_AMBULATORY_CARE_PROVIDER_SITE_OTHER): Payer: BC Managed Care – PPO | Admitting: Family Medicine

## 2021-12-26 VITALS — BP 128/80 | HR 66 | Ht 69.0 in | Wt 246.0 lb

## 2021-12-26 VITALS — BP 128/72 | HR 61 | Temp 97.6°F | Ht 67.75 in | Wt 244.2 lb

## 2021-12-26 DIAGNOSIS — Z1211 Encounter for screening for malignant neoplasm of colon: Secondary | ICD-10-CM

## 2021-12-26 DIAGNOSIS — E785 Hyperlipidemia, unspecified: Secondary | ICD-10-CM | POA: Diagnosis not present

## 2021-12-26 DIAGNOSIS — B354 Tinea corporis: Secondary | ICD-10-CM

## 2021-12-26 DIAGNOSIS — I214 Non-ST elevation (NSTEMI) myocardial infarction: Secondary | ICD-10-CM

## 2021-12-26 DIAGNOSIS — Z0279 Encounter for issue of other medical certificate: Secondary | ICD-10-CM

## 2021-12-26 DIAGNOSIS — R7303 Prediabetes: Secondary | ICD-10-CM

## 2021-12-26 DIAGNOSIS — H9313 Tinnitus, bilateral: Secondary | ICD-10-CM

## 2021-12-26 DIAGNOSIS — I2510 Atherosclerotic heart disease of native coronary artery without angina pectoris: Secondary | ICD-10-CM

## 2021-12-26 DIAGNOSIS — Z87891 Personal history of nicotine dependence: Secondary | ICD-10-CM

## 2021-12-26 DIAGNOSIS — Z0001 Encounter for general adult medical examination with abnormal findings: Secondary | ICD-10-CM

## 2021-12-26 DIAGNOSIS — M1A09X0 Idiopathic chronic gout, multiple sites, without tophus (tophi): Secondary | ICD-10-CM

## 2021-12-26 DIAGNOSIS — E559 Vitamin D deficiency, unspecified: Secondary | ICD-10-CM

## 2021-12-26 DIAGNOSIS — I10 Essential (primary) hypertension: Secondary | ICD-10-CM

## 2021-12-26 LAB — BASIC METABOLIC PANEL
Anion gap: 8 (ref 5–15)
BUN: 20 mg/dL (ref 6–20)
CO2: 23 mmol/L (ref 22–32)
Calcium: 9.3 mg/dL (ref 8.9–10.3)
Chloride: 104 mmol/L (ref 98–111)
Creatinine, Ser: 1.03 mg/dL (ref 0.61–1.24)
GFR, Estimated: >60 mL/min (ref >60)
Glucose, Bld: 114 mg/dL — ABNORMAL HIGH (ref 70–99)
Potassium: 4.4 mmol/L (ref 3.5–5.1)
Sodium: 135 mmol/L (ref 135–145)

## 2021-12-26 LAB — CBC
HCT: 47.2 % (ref 39.0–52.0)
Hemoglobin: 15.4 g/dL (ref 13.0–17.0)
MCH: 27.1 pg (ref 26.0–34.0)
MCHC: 32.6 g/dL (ref 30.0–36.0)
MCV: 83.1 fL (ref 80.0–100.0)
Platelets: 344 10*3/uL (ref 150–400)
RBC: 5.68 MIL/uL (ref 4.22–5.81)
RDW: 13.2 % (ref 11.5–15.5)
WBC: 7.4 10*3/uL (ref 4.0–10.5)
nRBC: 0 % (ref 0.0–0.2)

## 2021-12-26 MED ORDER — ALLOPURINOL 100 MG PO TABS: 100.0000 mg | ORAL_TABLET | Freq: Every day | ORAL | 6 refills | Status: DC

## 2021-12-26 NOTE — Patient Instructions (Addendum)
Medication Instructions:  No changes at this time.   *If you need a refill on your cardiac medications before your next appointment, please call your pharmacy*   Lab Work: BMET & CBC today over at the [facility]. Make sure to stop at registration.   If you have labs (blood work) drawn today and your tests are completely normal, you will receive your results only by: MyChart Message (if you have MyChart) OR A paper copy in the mail If you have any lab test that is abnormal or we need to change your treatment, we will call you to review the results.   Testing/Procedures: Treadmill test to be done around February 19, 2022  - you may eat a light breakfast/ lunch prior to your procedure - no caffeine for 24 hours prior to your test (coffee, tea, soft drinks, or chocolate)  - no smoking/ vaping for 4 hours prior to your test - you may take your regular medications the day of your test except for: DO NOT take carvedilol the night before and the morning of your procedure.  - bring any inhalers with you to your test - wear comfortable clothing & tennis/ non-skid shoes to walk on the treadmill   Follow-Up: At [facility], you and your health needs are our priority.  As part of our continuing mission to provide you with exceptional heart care, we have created designated Provider Care Teams.  These Care Teams include your primary Cardiologist (physician) and Advanced Practice Providers (APPs -  Physician Assistants and Nurse Practitioners) who all work together to provide you with the care you need, when you need it.   Your next appointment:   1 month(s)  The format for your next appointment:   In Person  Provider:   [name], MD or Eula Listen, PA-C       Important Information About Sugar

## 2021-12-26 NOTE — Assessment & Plan Note (Addendum)
Preventative protocols reviewed and updated unless pt declined. Discussed healthy diet and lifestyle.  Reviewed colon cancer screening options - he was interested in colonoscopy however given recent MI and dual blood thinners, will start with iFOB.

## 2021-12-26 NOTE — Telephone Encounter (Signed)
Pt calling back for an update  

## 2021-12-26 NOTE — Telephone Encounter (Signed)
Patient came by office  Filled out [facility] forms and paid $29 cash fee Placed forms with nurse

## 2021-12-26 NOTE — Progress Notes (Signed)
Patient ID: Eric Huff, male    DOB: 04/15/1970, 51 y.o.   MRN: [id number]  This visit was conducted in person.  BP 128/72   Pulse 61   Temp 97.6 F (36.4 C) (Temporal)   Ht 5' 7.75" (1.721 m)   Wt 244 lb 3.2 oz (110.8 kg)   SpO2 97%   BMI 37.41 kg/m    CC: CPE Subjective:   HPI: Eric Huff is a 51 y.o. male presenting on 12/26/2021 for Annual Exam (Admitted on 12/17/21 at [facility], dx chest pain; elevated troponin. Pt accompanied by wife, Eric Huff. )   Recent hospitalization for chest pain, found to have NSTEMI s/p left heart catheterization with 2 stents to left circumflex. Discharged on ASA 81 mg and Effient for 1 year. Lisinopril continued, metoprolol changed to carvedilol 3.125mg  BID.   Notes longstanding bilateral tinnitus present for years.  Notes 5 episodes of gout over the past 2 months. Interested in daily preventative medication.   Preventative: Colon cancer screening - continue yearly iFOB  Prostate cancer - denies prostate symptoms. H/o prostatitis. Continue yearly PSA.  Lung cancer screening - eligible, declines referral  Flu shot - declines  COVID vaccine - declines Tetanus shot - 2005. Tdap 10/2018  Shingrix - 11/2020, 02/2021 Seat belt use discussed  Sunscreen use discussed, no changing moles on skin.  Sleep - averaging 8 hours/night  Ex smoker, quit ~2010, ~30 PY hx, quit dipping 01/2021, quit cigars 06/2021 Alcohol - rarely  Dentist Q6 mo Eye exam - last year    Lives with wife, youngest daughter  Occupation: Works for [employer]  Activity: no regular exercise - physical at job 12,000 steps/day Diet: good water, fruits/vegetables daily     Relevant past medical, surgical, family and social history reviewed and updated as indicated. Interim medical history since our last visit reviewed. Allergies and medications reviewed and updated. Outpatient Medications Prior to Visit  Medication Sig Dispense Refill   aspirin EC 81 MG  tablet Take 1 tablet (81 mg total) by mouth daily. Swallow whole.     atorvastatin (LIPITOR) 80 MG tablet Take 1 tablet (80 mg total) by mouth daily. 30 tablet 2   CALCIUM-MAGNESIUM-ZINC PO Take 3 tablets by mouth daily.     carvedilol (COREG) 3.125 MG tablet Take 1 tablet (3.125 mg total) by mouth 2 (two) times daily with a meal. 60 tablet 2   Cholecalciferol (VITAMIN D3) 125 MCG (5000 UT) CAPS Take 1 capsule by mouth daily.     loratadine (CLARITIN) 10 MG tablet Take 10 mg by mouth daily.     prasugrel (EFFIENT) 10 MG TABS tablet Take 1 tablet (10 mg total) by mouth daily. 30 tablet 11   colchicine 0.6 MG tablet Take 1 tablet (0.6 mg total) by mouth daily as needed. For gout flares, may take 2 tablets on the first day of flare 30 tablet 3   lisinopril (ZESTRIL) 10 MG tablet TAKE 1 TABLET BY MOUTH DAILY 90 tablet 0   Naproxen Sodium 220 MG CAPS Take 220 mg by mouth as needed.     No facility-administered medications prior to visit.     Per HPI unless specifically indicated in ROS section below Review of Systems  Constitutional:  Positive for diaphoresis. Negative for activity change, appetite change, chills, fatigue, fever and unexpected weight change.  HENT:  Negative for hearing loss.   Eyes:  Negative for visual disturbance.  Respiratory:  Negative for cough, chest tightness, shortness of  breath and wheezing.   Cardiovascular:  Positive for chest pain. Negative for palpitations and leg swelling.  Gastrointestinal:  Negative for abdominal distention, abdominal pain, blood in stool, constipation, diarrhea, nausea and vomiting.  Genitourinary:  Negative for difficulty urinating and hematuria.  Musculoskeletal:  Negative for arthralgias, myalgias and neck pain.  Skin:  Negative for rash.  Neurological:  Negative for dizziness, seizures, syncope and headaches.  Hematological:  Negative for adenopathy. Bruises/bleeds easily.  Psychiatric/Behavioral:  Negative for dysphoric mood. The patient  is not nervous/anxious.     Objective:  BP 128/72   Pulse 61   Temp 97.6 F (36.4 C) (Temporal)   Ht 5' 7.75" (1.721 m)   Wt 244 lb 3.2 oz (110.8 kg)   SpO2 97%   BMI 37.41 kg/m   Wt Readings from Last 3 Encounters:  12/26/21 244 lb 3.2 oz (110.8 kg)  12/26/21 246 lb (111.6 kg)  12/19/21 249 lb (112.9 kg)      Physical Exam Vitals and nursing note reviewed.  Constitutional:      General: He is not in acute distress.    Appearance: Normal appearance. He is well-developed. He is not ill-appearing.  HENT:     Head: Normocephalic and atraumatic.     Right Ear: Hearing, tympanic membrane, ear canal and external ear normal.     Left Ear: Hearing, tympanic membrane, ear canal and external ear normal.     Nose: Nose normal.     Mouth/Throat:     Mouth: Mucous membranes are moist.     Pharynx: Oropharynx is clear. No oropharyngeal exudate or posterior oropharyngeal erythema.  Eyes:     General: No scleral icterus.    Extraocular Movements: Extraocular movements intact.     Conjunctiva/sclera: Conjunctivae normal.     Pupils: Pupils are equal, round, and reactive to light.  Neck:     Thyroid: No thyroid mass or thyromegaly.  Cardiovascular:     Rate and Rhythm: Normal rate and regular rhythm.     Pulses: Normal pulses.          Radial pulses are 2+ on the right side and 2+ on the left side.     Heart sounds: Normal heart sounds. No murmur heard. Pulmonary:     Effort: Pulmonary effort is normal. No respiratory distress.     Breath sounds: Normal breath sounds. No wheezing, rhonchi or rales.  Abdominal:     General: Bowel sounds are normal. There is no distension.     Palpations: Abdomen is soft. There is no mass.     Tenderness: There is no abdominal tenderness. There is no guarding or rebound.     Hernia: No hernia is present.  Musculoskeletal:        General: Normal range of motion.     Cervical back: Normal range of motion and neck supple.     Right lower leg: No  edema.     Left lower leg: No edema.  Lymphadenopathy:     Cervical: No cervical adenopathy.  Skin:    General: Skin is warm and dry.     Findings: Rash present.     Comments: Scaly large patch of annular rash to right lateral side into thigh, with central clearing   Neurological:     General: No focal deficit present.     Mental Status: He is alert and oriented to person, place, and time.  Psychiatric:        Mood and Affect: Mood normal.  Behavior: Behavior normal.        Thought Content: Thought content normal.        Judgment: Judgment normal.       Results for orders placed or performed during the hospital encounter of 12/26/21  Basic metabolic panel  Result Value Ref Range   Sodium 135 135 - 145 mmol/L   Potassium 4.4 3.5 - 5.1 mmol/L   Chloride 104 98 - 111 mmol/L   CO2 23 22 - 32 mmol/L   Glucose, Bld 114 (H) 70 - 99 mg/dL   BUN 20 6 - 20 mg/dL   Creatinine, Ser 7.82 0.61 - 1.24 mg/dL   Calcium 9.3 8.9 - 95.6 mg/dL   GFR, Estimated >21 >30 mL/min   Anion gap 8 5 - 15  CBC  Result Value Ref Range   WBC 7.4 4.0 - 10.5 K/uL   RBC 5.68 4.22 - 5.81 MIL/uL   Hemoglobin 15.4 13.0 - 17.0 g/dL   HCT 86.5 78.4 - 69.6 %   MCV 83.1 80.0 - 100.0 fL   MCH 27.1 26.0 - 34.0 pg   MCHC 32.6 30.0 - 36.0 g/dL   RDW 29.5 28.4 - 13.2 %   Platelets 344 150 - 400 K/uL   nRBC 0.0 0.0 - 0.2 %   Lab Results  Component Value Date   LABURIC 8.7 (H) 12/16/2021   Lab Results  Component Value Date   HGBA1C 5.9 12/16/2021   Lab Results  Component Value Date   CHOL 208 (H) 12/18/2021   HDL 34 (L) 12/18/2021   LDLCALC 138 (H) 12/18/2021   LDLDIRECT 114.0 12/16/2021   TRIG 181 (H) 12/18/2021   CHOLHDL 6.1 12/18/2021    Assessment & Plan:   Problem List Items Addressed This Visit       Unprioritized   Encounter for general adult medical examination with abnormal findings - Primary (Chronic)    Preventative protocols reviewed and updated unless pt declined. Discussed  healthy diet and lifestyle.  Reviewed colon cancer screening options - he was interested in colonoscopy however given recent MI and dual blood thinners, will start with iFOB.       Essential hypertension    Chronic, stable on lisinopril and carvedilol. Consider changing lisinopril to losartan for uricosuric effect.       Relevant Medications   lisinopril (ZESTRIL) 10 MG tablet   Prediabetes    Reviewed A1c over the years, encouraged limiting added sugars/carbs in diet.       Severe obesity (BMI 35.0-39.9) with comorbidity (HCC)    Encouraged healthy diet and lifestyle choices to affect sustainable weight loss. He is motivated to work on this.  He may be a candidate for weekly injectable Wegovy/Zepbound.  Obesity complicated by comorbidities of hypertension, hyperlipidemia, prediabetes, gout, and now CAD/MI.       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  He is now on high dose atorvastatin 80mg  daily with goal LDL <50.  He is now seeing cardiology. The ASCVD Risk score (Arnett DK, et al., 2019) failed to calculate for the following reasons:   The patient has a prior MI or stroke diagnosis       Chronic gout    Chronic, deteriorated with frequent flares recently.  He has been managing with colchicine PRN.  He is interested in daily gout lowering medication - will start allopurinol 100mg  daily, reviewed risk of precipitating gout flare whenever medication is started/titrated due to fluctuating  uric acid levels in the blood.  RTC 3 months gout f/u.  Low purine diet handout provided.       Vitamin D deficiency    Continue \\vit  D 5000IU daily.       Tinea corporis    Previously KOH positive. Has not been regular with topical lotrimin - rec BID dosing x 3-4 wks      NSTEMI (non-ST elevated myocardial infarction) (HCC)    S/p LHC with stents x2 to L circumflex. Has cardiology f/u planned.       Relevant Medications    lisinopril (ZESTRIL) 10 MG tablet   Tinnitus of both ears    Notes tinnitus bilaterally without associated hearing loss. Managing with white noise.  Discussed possible contributors including ACEI, ASA, NSAIDs.  Will reassess next visit.       Other Visit Diagnoses     Special screening for malignant neoplasms, colon       Relevant Orders   Fecal occult blood, imunochemical        Meds ordered this encounter  Medications   allopurinol (ZYLOPRIM) 100 MG tablet    Sig: Take 1 tablet (100 mg total) by mouth daily.    Dispense:  30 tablet    Refill:  6   colchicine 0.6 MG tablet    Sig: Take 1 tablet (0.6 mg total) by mouth daily as needed. For gout flares, may take 2 tablets on the first day of flare    Dispense:  30 tablet    Refill:  3   lisinopril (ZESTRIL) 10 MG tablet    Sig: Take 1 tablet (10 mg total) by mouth daily.    Dispense:  90 tablet    Refill:  1   Orders Placed This Encounter  Procedures   Fecal occult blood, imunochemical    Standing Status:   Future    Standing Expiration Date:   12/27/2022    Patient instructions: Pass by lab to pick up stool kit.  Continue current medicines. Start allouprinol 100mg  daily gout prevention medicine.  Good to see you today Return in 3-4 months for follow up visit on gout  Follow up plan: Return in about 4 months (around 04/27/2022) for follow up visit.  Eustaquio Boyden, MD

## 2021-12-26 NOTE — Patient Instructions (Addendum)
Pass by lab to pick up stool kit.  Continue current medicines. Start allouprinol 155m daily gout prevention medicine.  Good to see you today Return in 3-4 months for follow up visit on gout.   Low-Purine Eating Plan A low-purine eating plan involves making food choices to limit your purine intake. Purine is a kind of uric acid. Too much uric acid in your blood can cause certain conditions, such as gout and kidney stones. Eating a low-purine diet may help control these conditions. What are tips for following this plan? Shopping Avoid buying products that contain high-fructose corn syrup. Check for this on food labels. It is commonly found in many processed foods and soft drinks. Be sure to check for it in baked goods such as cookies, canned fruits, and cereals and cereal bars. Avoid buying veal, chicken breast with skin, lamb, and organ meats such as liver. These types of meats tend to have the highest purine content. Choose dairy products. These may lower uric acid levels. Avoid certain types of fish. Not all fish and seafood have high purine content. Examples with high purine content include anchovies, trout, tuna, sardines, and salmon. Avoid buying beverages that contain alcohol, particularly beer and hard liquor. Alcohol can affect the way your body gets rid of uric acid. Meal planning  Learn which foods do or do not affect you. If you find out that a food tends to cause your gout symptoms to flare up, avoid eating that food. You can enjoy foods that do not cause problems. If you have any questions about a food item, talk with your dietitian or health care provider. Reduce the overall amount of meat in your diet. When you do eat meat, choose ones with lower purine content. Include plenty of fruits and vegetables. Although some vegetables may have a high purine content--such as asparagus, mushrooms, spinach, or cauliflower--it has been shown that these do not contribute to uric acid blood levels  as much. Consume at least 1 dairy serving a day. This has been shown to decrease uric acid levels. General information If you drink alcohol: Limit how much you have to: 0-1 drink a day for women who are not pregnant. 0-2 drinks a day for men. Know how much alcohol is in a drink. In the [place] one drink equals one 12 oz bottle of beer (355 mL), one 5 oz glass of wine (148 mL), or one 1 oz glass of hard liquor (44 mL). Drink plenty of water. Try to drink enough to keep your urine pale yellow. Fluids can help remove uric acid from your body. Work with your health care provider and dietitian to develop a plan to achieve or maintain a healthy weight. Losing weight may help reduce uric acid in your blood. What foods are recommended? The following are some types of foods that are good choices when limiting purine intake: Fresh or frozen fruits and vegetables. Whole grains, breads, cereals, and pasta. Rice. Beans, peas, legumes. Nuts and seeds. Dairy products. Fats and oils. The items listed above may not be a complete list. Talk with a dietitian about what dietary choices are best for you. What foods are not recommended? Limit your intake of foods high in purines, including: Beer and other alcohol. Meat-based gravy or sauce. Canned or fresh fish, such as: Anchovies, sardines, herring, salmon, and tuna. Mussels and scallops. Codfish, trout, and haddock. Bacon, veal, chicken breast with skin, and lamb. Organ meats, such as: Liver or kidney. Tripe. Sweetbreads (thymus gland or pancreas).  Wild Clinical biochemist. Yeast or yeast extract supplements. Drinks sweetened with high-fructose corn syrup, such as soda. Processed foods made with high-fructose corn syrup. The items listed above may not be a complete list of foods and beverages you should limit. Contact a dietitian for more information. Summary Eating a low-purine diet may help control conditions caused by too much uric acid in the body,  such as gout or kidney stones. Choose low-purine foods, limit alcohol, and limit high-fructose corn syrup. You will learn over time which foods do or do not affect you. If you find out that a food tends to cause your gout symptoms to flare up, avoid eating that food. This information is not intended to replace advice given to you by your health care provider. Make sure you discuss any questions you have with your health care provider. Document Revised: 12/26/2020 Document Reviewed: 12/26/2020 Elsevier Patient Education  [place] Maintenance, Male Adopting a healthy lifestyle and getting preventive care are important in promoting health and wellness. Ask your health care provider about: The right schedule for you to have regular tests and exams. Things you can do on your own to prevent diseases and keep yourself healthy. What should I know about diet, weight, and exercise? Eat a healthy diet  Eat a diet that includes plenty of vegetables, fruits, low-fat dairy products, and lean protein. Do not eat a lot of foods that are high in solid fats, added sugars, or sodium. Maintain a healthy weight Body mass index (BMI) is a measurement that can be used to identify possible weight problems. It estimates body fat based on height and weight. Your health care provider can help determine your BMI and help you achieve or maintain a healthy weight. Get regular exercise Get regular exercise. This is one of the most important things you can do for your health. Most adults should: Exercise for at least 150 minutes each week. The exercise should increase your heart rate and make you sweat (moderate-intensity exercise). Do strengthening exercises at least twice a week. This is in addition to the moderate-intensity exercise. Spend less time sitting. Even light physical activity can be beneficial. Watch cholesterol and blood lipids Have your blood tested for lipids and cholesterol at 53 years  of age, then have this test every 5 years. You may need to have your cholesterol levels checked more often if: Your lipid or cholesterol levels are high. You are older than 53 years of age. You are at high risk for heart disease. What should I know about cancer screening? Many types of cancers can be detected early and may often be prevented. Depending on your health history and family history, you may need to have cancer screening at various ages. This may include screening for: Colorectal cancer. Prostate cancer. Skin cancer. Lung cancer. What should I know about heart disease, diabetes, and high blood pressure? Blood pressure and heart disease High blood pressure causes heart disease and increases the risk of stroke. This is more likely to develop in people who have high blood pressure readings or are overweight. Talk with your health care provider about your target blood pressure readings. Have your blood pressure checked: Every 3-5 years if you are 74-59 years of age. Every year if you are 64 years old or older. If you are between the ages of 44 and 20 and are a current or former smoker, ask your health care provider if you should have a one-time screening for abdominal aortic  aneurysm (AAA). Diabetes Have regular diabetes screenings. This checks your fasting blood sugar level. Have the screening done: Once every three years after age 81 if you are at a normal weight and have a low risk for diabetes. More often and at a younger age if you are overweight or have a high risk for diabetes. What should I know about preventing infection? Hepatitis B If you have a higher risk for hepatitis B, you should be screened for this virus. Talk with your health care provider to find out if you are at risk for hepatitis B infection. Hepatitis C Blood testing is recommended for: Everyone born from [birth year] through 1967. Anyone with known risk factors for hepatitis C. Sexually transmitted infections  (STIs) You should be screened each year for STIs, including gonorrhea and chlamydia, if: You are sexually active and are younger than 53 years of age. You are older than 53 years of age and your health care provider tells you that you are at risk for this type of infection. Your sexual activity has changed since you were last screened, and you are at increased risk for chlamydia or gonorrhea. Ask your health care provider if you are at risk. Ask your health care provider about whether you are at high risk for HIV. Your health care provider may recommend a prescription medicine to help prevent HIV infection. If you choose to take medicine to prevent HIV, you should first get tested for HIV. You should then be tested every 3 months for as long as you are taking the medicine. Follow these instructions at home: Alcohol use Do not drink alcohol if your health care provider tells you not to drink. If you drink alcohol: Limit how much you have to 0-2 drinks a day. Know how much alcohol is in your drink. In the [place] one drink equals one 12 oz bottle of beer (355 mL), one 5 oz glass of wine (148 mL), or one 1 oz glass of hard liquor (44 mL). Lifestyle Do not use any products that contain nicotine or tobacco. These products include cigarettes, chewing tobacco, and vaping devices, such as e-cigarettes. If you need help quitting, ask your health care provider. Do not use street drugs. Do not share needles. Ask your health care provider for help if you need support or information about quitting drugs. General instructions Schedule regular health, dental, and eye exams. Stay current with your vaccines. Tell your health care provider if: You often feel depressed. You have ever been abused or do not feel safe at home. Summary Adopting a healthy lifestyle and getting preventive care are important in promoting health and wellness. Follow your health care provider's instructions about healthy diet,  exercising, and getting tested or screened for diseases. Follow your health care provider's instructions on monitoring your cholesterol and blood pressure. This information is not intended to replace advice given to you by your health care provider. Make sure you discuss any questions you have with your health care provider. Document Revised: 06/03/2020 Document Reviewed: 06/03/2020 Elsevier Patient Education  [place].

## 2021-12-26 NOTE — Telephone Encounter (Signed)
Spoke with patient and reviewed that it may be the first of next week and he verbalized understanding. No further needs at this time.

## 2021-12-27 ENCOUNTER — Encounter: Payer: Self-pay | Admitting: Family Medicine

## 2021-12-27 DIAGNOSIS — H9313 Tinnitus, bilateral: Secondary | ICD-10-CM | POA: Insufficient documentation

## 2021-12-27 MED ORDER — LISINOPRIL 10 MG PO TABS: 10.0000 mg | ORAL_TABLET | Freq: Every day | ORAL | 1 refills | Status: DC

## 2021-12-27 MED ORDER — COLCHICINE 0.6 MG PO TABS: 0.6000 mg | ORAL_TABLET | Freq: Every day | ORAL | 3 refills | Status: DC | PRN

## 2021-12-27 NOTE — Assessment & Plan Note (Addendum)
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  He is now on high dose atorvastatin 80mg  daily with goal LDL <50.  He is now seeing cardiology. The ASCVD Risk score (Arnett DK, et al., 2019) failed to calculate for the following reasons:   The patient has a prior MI or stroke diagnosis

## 2021-12-27 NOTE — Assessment & Plan Note (Signed)
Continue vit D 5000 IU daily.  

## 2021-12-27 NOTE — Assessment & Plan Note (Addendum)
S/p LHC with stents x2 to L circumflex. Has cardiology f/u planned.

## 2021-12-27 NOTE — Assessment & Plan Note (Addendum)
Encouraged healthy diet and lifestyle choices to affect sustainable weight loss. He is motivated to work on this.  He may be a candidate for weekly injectable Wegovy/Zepbound.  Obesity complicated by comorbidities of hypertension, hyperlipidemia, prediabetes, gout, and now CAD/MI.

## 2021-12-27 NOTE — Assessment & Plan Note (Signed)
Chronic, deteriorated with frequent flares recently.  He has been managing with colchicine PRN.  He is interested in daily gout lowering medication - will start allopurinol 100mg  daily, reviewed risk of precipitating gout flare whenever medication is started/titrated due to fluctuating uric acid levels in the blood.  RTC 3 months gout f/u.  Low purine diet handout provided.

## 2021-12-27 NOTE — Assessment & Plan Note (Signed)
Previously KOH positive. Has not been regular with topical lotrimin - rec BID dosing x 3-4 wks

## 2021-12-27 NOTE — Assessment & Plan Note (Signed)
Chronic, stable on lisinopril and carvedilol. Consider changing lisinopril to losartan for uricosuric effect.

## 2021-12-27 NOTE — Assessment & Plan Note (Signed)
Notes tinnitus bilaterally without associated hearing loss. Managing with white noise.  Discussed possible contributors including ACEI, ASA, NSAIDs.  Will reassess next visit.

## 2021-12-27 NOTE — Assessment & Plan Note (Signed)
Reviewed A1c over the years, encouraged limiting added sugars/carbs in diet.

## 2021-12-29 NOTE — Telephone Encounter (Signed)
Forms have been completed and given to [employer].

## 2021-12-29 NOTE — Telephone Encounter (Signed)
Complete forms have been scanned Patient has been called forms/records are ready for pickup Placed at pick up bin

## 2021-12-30 ENCOUNTER — Encounter: Payer: BC Managed Care – PPO | Attending: Cardiovascular Disease | Admitting: *Deleted

## 2021-12-30 DIAGNOSIS — Z48812 Encounter for surgical aftercare following surgery on the circulatory system: Secondary | ICD-10-CM | POA: Insufficient documentation

## 2021-12-30 DIAGNOSIS — I214 Non-ST elevation (NSTEMI) myocardial infarction: Secondary | ICD-10-CM

## 2021-12-30 DIAGNOSIS — Z955 Presence of coronary angioplasty implant and graft: Secondary | ICD-10-CM | POA: Insufficient documentation

## 2021-12-30 DIAGNOSIS — I252 Old myocardial infarction: Secondary | ICD-10-CM | POA: Insufficient documentation

## 2021-12-30 NOTE — Progress Notes (Signed)
Initial telephone orientation completed. Diagnosis can be found in [facility] 11/22. EP orientation scheduled for Monday 12/11 at 1:30.

## 2021-12-31 ENCOUNTER — Other Ambulatory Visit (INDEPENDENT_AMBULATORY_CARE_PROVIDER_SITE_OTHER): Payer: BC Managed Care – PPO

## 2021-12-31 DIAGNOSIS — Z1211 Encounter for screening for malignant neoplasm of colon: Secondary | ICD-10-CM | POA: Diagnosis not present

## 2022-01-01 ENCOUNTER — Telehealth: Payer: Self-pay | Admitting: Family Medicine

## 2022-01-01 DIAGNOSIS — R222 Localized swelling, mass and lump, trunk: Secondary | ICD-10-CM | POA: Insufficient documentation

## 2022-01-01 LAB — FECAL OCCULT BLOOD, IMMUNOCHEMICAL: Fecal Occult Bld: NEGATIVE

## 2022-01-01 NOTE — Telephone Encounter (Signed)
Seen pt at wife's ov for lump to left chest wall present for months - see problem list

## 2022-01-01 NOTE — Assessment & Plan Note (Signed)
Suspect LN vs scarred nodule. No skin changes, lump not changing. Will monitor and let me know if any change for ultrasound.

## 2022-01-05 ENCOUNTER — Encounter: Payer: BC Managed Care – PPO | Admitting: *Deleted

## 2022-01-05 VITALS — Ht 69.5 in | Wt 252.6 lb

## 2022-01-05 DIAGNOSIS — Z955 Presence of coronary angioplasty implant and graft: Secondary | ICD-10-CM | POA: Diagnosis not present

## 2022-01-05 DIAGNOSIS — I214 Non-ST elevation (NSTEMI) myocardial infarction: Secondary | ICD-10-CM

## 2022-01-05 DIAGNOSIS — Z48812 Encounter for surgical aftercare following surgery on the circulatory system: Secondary | ICD-10-CM | POA: Diagnosis not present

## 2022-01-05 DIAGNOSIS — I252 Old myocardial infarction: Secondary | ICD-10-CM | POA: Diagnosis not present

## 2022-01-05 NOTE — Patient Instructions (Signed)
Patient Instructions  Patient Details  Name: Eric Huff MRN: [id number] Date of Birth: 16-Feb-1970 Referring Provider:  Wellington Hampshire, MD  Below are your personal goals for exercise, nutrition, and risk factors. Our goal is to help you stay on track towards obtaining and maintaining these goals. We will be discussing your progress on these goals with you throughout the program.  Initial Exercise Prescription:  Initial Exercise Prescription - 01/05/22 1500       Date of Initial Exercise RX and Referring Provider   Date 01/05/22    Referring Provider Arida      Oxygen   Maintain Oxygen Saturation 88% or higher      Treadmill   MPH 3    Grade 1    Minutes 15    METs 3.71      Recumbant Bike   Level 3    RPM 50    Minutes 15    METs 3.52      Elliptical   Level 1    Speed 3    Minutes 15    METs 3.52      REL-XR   Level 3    Speed 50    Minutes 15    METs 3.52      Biostep-RELP   Level 3    SPM 50    Minutes 15    METs 3.52      Track   Laps 42    Minutes 15    METs 3.28      Prescription Details   Frequency (times per week) 3    Duration Progress to 30 minutes of continuous aerobic without signs/symptoms of physical distress      Intensity   THRR 40-80% of Max Heartrate 100-144    Ratings of Perceived Exertion 11-13    Perceived Dyspnea 0-4      Progression   Progression Continue to progress workloads to maintain intensity without signs/symptoms of physical distress.      Resistance Training   Training Prescription Yes    Weight 5    Reps 10-15             Exercise Goals: Frequency: Be able to perform aerobic exercise two to three times per week in program working toward 2-5 days per week of home exercise.  Intensity: Work with a perceived exertion of 11 (fairly light) - 15 (hard) while following your exercise prescription.  We will make changes to your prescription with you as you progress through the program.   Duration: Be  able to do 30 to 45 minutes of continuous aerobic exercise in addition to a 5 minute warm-up and a 5 minute cool-down routine.   Nutrition Goals: Your personal nutrition goals will be established when you do your nutrition analysis with the dietician.  The following are general nutrition guidelines to follow: Cholesterol < 200mg /day Sodium < 1500mg /day Fiber: Men over 50 yrs - 30 grams per day  Personal Goals:  Personal Goals and Risk Factors at Admission - 01/05/22 1517       Core Components/Risk Factors/Patient Goals on Admission    Weight Management Yes;Weight Loss    Intervention Weight Management: Develop a combined nutrition and exercise program designed to reach desired caloric intake, while maintaining appropriate intake of nutrient and fiber, sodium and fats, and appropriate energy expenditure required for the weight goal.;Weight Management: Provide education and appropriate resources to help participant work on and attain dietary goals.;Weight Management/Obesity: Establish reasonable short term and  long term weight goals.;Obesity: Provide education and appropriate resources to help participant work on and attain dietary goals.    Admit Weight 252 lb 9.6 oz (114.6 kg)    Goal Weight: Short Term 245 lb (111.1 kg)    Goal Weight: Long Term 200 lb (90.7 kg)    Expected Outcomes Short Term: Continue to assess and modify interventions until short term weight is achieved;Long Term: Adherence to nutrition and physical activity/exercise program aimed toward attainment of established weight goal;Weight Loss: Understanding of general recommendations for a balanced deficit meal plan, which promotes 1-2 lb weight loss per week and includes a negative energy balance of [phone] kcal/d;Understanding recommendations for meals to include 15-35% energy as protein, 25-35% energy from fat, 35-60% energy from carbohydrates, less than 200mg  of dietary cholesterol, 20-35 gm of total fiber daily;Understanding  of distribution of calorie intake throughout the day with the consumption of 4-5 meals/snacks    Hypertension Yes    Intervention Provide education on lifestyle modifcations including regular physical activity/exercise, weight management, moderate sodium restriction and increased consumption of fresh fruit, vegetables, and low fat dairy, alcohol moderation, and smoking cessation.;Monitor prescription use compliance.    Expected Outcomes Short Term: Continued assessment and intervention until BP is < 140/18mm HG in hypertensive participants. < 130/39mm HG in hypertensive participants with diabetes, heart failure or chronic kidney disease.;Long Term: Maintenance of blood pressure at goal levels.    Lipids Yes    Intervention Provide education and support for participant on nutrition & aerobic/resistive exercise along with prescribed medications to achieve LDL 70mg , HDL >40mg .    Expected Outcomes Short Term: Participant states understanding of desired cholesterol values and is compliant with medications prescribed. Participant is following exercise prescription and nutrition guidelines.;Long Term: Cholesterol controlled with medications as prescribed, with individualized exercise RX and with personalized nutrition plan. Value goals: LDL < 70mg , HDL > 40 mg.             Tobacco Use Initial Evaluation: Social History   Tobacco Use  Smoking Status Former   Types: Cigarettes, Cigars   Quit date: 12/19/2021   Years since quitting: 0.0  Smokeless Tobacco Former  Tobacco Comments   Quit cigarettes in 2009.  Quit cigars in November 2023    Exercise Goals and Review:  Exercise Goals     Row Name 01/05/22 1513             Exercise Goals   Increase Physical Activity Yes       Intervention Provide advice, education, support and counseling about physical activity/exercise needs.;Develop an individualized exercise prescription for aerobic and resistive training based on initial evaluation  findings, risk stratification, comorbidities and participant's personal goals.       Expected Outcomes Short Term: Attend rehab on a regular basis to increase amount of physical activity.;Long Term: Add in home exercise to make exercise part of routine and to increase amount of physical activity.;Long Term: Exercising regularly at least 3-5 days a week.       Increase Strength and Stamina Yes       Intervention Provide advice, education, support and counseling about physical activity/exercise needs.;Develop an individualized exercise prescription for aerobic and resistive training based on initial evaluation findings, risk stratification, comorbidities and participant's personal goals.       Expected Outcomes Short Term: Increase workloads from initial exercise prescription for resistance, speed, and METs.;Short Term: Perform resistance training exercises routinely during rehab and add in resistance training at home;Long Term: Improve cardiorespiratory fitness,  muscular endurance and strength as measured by increased METs and functional capacity (6MWT)       Able to understand and use rate of perceived exertion (RPE) scale Yes       Intervention Provide education and explanation on how to use RPE scale       Expected Outcomes Short Term: Able to use RPE daily in rehab to express subjective intensity level;Long Term:  Able to use RPE to guide intensity level when exercising independently       Able to understand and use Dyspnea scale Yes       Intervention Provide education and explanation on how to use Dyspnea scale       Expected Outcomes Short Term: Able to use Dyspnea scale daily in rehab to express subjective sense of shortness of breath during exertion;Long Term: Able to use Dyspnea scale to guide intensity level when exercising independently       Knowledge and understanding of Target Heart Rate Range (THRR) Yes       Intervention Provide education and explanation of THRR including how the numbers  were predicted and where they are located for reference       Expected Outcomes Short Term: Able to state/look up THRR;Long Term: Able to use THRR to govern intensity when exercising independently;Short Term: Able to use daily as guideline for intensity in rehab       Able to check pulse independently Yes       Intervention Provide education and demonstration on how to check pulse in carotid and radial arteries.;Review the importance of being able to check your own pulse for safety during independent exercise       Expected Outcomes Short Term: Able to explain why pulse checking is important during independent exercise;Long Term: Able to check pulse independently and accurately       Understanding of Exercise Prescription Yes       Intervention Provide education, explanation, and written materials on patient's individual exercise prescription       Expected Outcomes Short Term: Able to explain program exercise prescription;Long Term: Able to explain home exercise prescription to exercise independently                Copy of goals given to participant.

## 2022-01-05 NOTE — Progress Notes (Signed)
Cardiac Individual Treatment Plan  Patient Details  Name: Eric Huff MRN: [id number] Date of Birth: [birth year] Referring Provider:   Flowsheet Row Cardiac Rehab from 01/05/2022 in [facility] Cardiac and Pulmonary Rehab  Referring Provider Arida       Initial Encounter Date:  Flowsheet Row Cardiac Rehab from 01/05/2022 in [facility] Cardiac and Pulmonary Rehab  Date 01/05/22       Visit Diagnosis: NSTEMI (non-ST elevated myocardial infarction) [facility])  Status post coronary artery stent placement  Patient's Home Medications on Admission:  Current Outpatient Medications:    allopurinol (ZYLOPRIM) 100 MG tablet, Take 1 tablet (100 mg total) by mouth daily., Disp: 30 tablet, Rfl: 6   aspirin EC 81 MG tablet, Take 1 tablet (81 mg total) by mouth daily. Swallow whole., Disp: , Rfl:    atorvastatin (LIPITOR) 80 MG tablet, Take 1 tablet (80 mg total) by mouth daily., Disp: 30 tablet, Rfl: 2   CALCIUM-MAGNESIUM-ZINC PO, Take 3 tablets by mouth daily., Disp: , Rfl:    carvedilol (COREG) 3.125 MG tablet, Take 1 tablet (3.125 mg total) by mouth 2 (two) times daily with a meal., Disp: 60 tablet, Rfl: 2   Cholecalciferol (VITAMIN D3) 125 MCG (5000 UT) CAPS, Take 1 capsule by mouth daily., Disp: , Rfl:    colchicine 0.6 MG tablet, Take 1 tablet (0.6 mg total) by mouth daily as needed. For gout flares, may take 2 tablets on the first day of flare, Disp: 30 tablet, Rfl: 3   lisinopril (ZESTRIL) 10 MG tablet, Take 1 tablet (10 mg total) by mouth daily., Disp: 90 tablet, Rfl: 1   loratadine (CLARITIN) 10 MG tablet, Take 10 mg by mouth daily., Disp: , Rfl:    prasugrel (EFFIENT) 10 MG TABS tablet, Take 1 tablet (10 mg total) by mouth daily., Disp: 30 tablet, Rfl: 11  Past Medical History: Past Medical History:  Diagnosis Date   Gout    Hypertension 12/26/2005   Morbid obesity (HCC)    Plantar fascia rupture 08/29/2010   Tobacco use    a. Smokes 1 cigar daily.    Tobacco Use: Social History    Tobacco Use  Smoking Status Former   Types: Cigarettes, Cigars   Quit date: 12/19/2021   Years since quitting: 0.0  Smokeless Tobacco Former  Tobacco Comments   Quit cigarettes in 2009.  Quit cigars in November 2023    Labs: Review Flowsheet  More data exists      Latest Ref Rng & Units 11/07/2018 12/15/2019 12/17/2020 12/16/2021 12/18/2021  Labs for ITP Cardiac and Pulmonary Rehab  Cholestrol 0 - 200 mg/dL 443  154  008  676  195   LDL (calc) 0 - 99 mg/dL 96  093  267  - 124   Direct LDL mg/dL - - - 580.9  -  HDL-C >98 mg/dL 33.82  50.53  97.67  34.19  34   Trlycerides <150 mg/dL 379.0  240.9  735.3  299.2  181   Hemoglobin A1c 4.6 - 6.5 % 5.8  5.7  5.9  5.9  -     Exercise Target Goals: Exercise Program Goal: Individual exercise prescription set using results from initial 6 min walk test and THRR while considering  patient's activity barriers and safety.   Exercise Prescription Goal: Initial exercise prescription builds to 30-45 minutes a day of aerobic activity, 2-3 days per week.  Home exercise guidelines will be given to patient during program as part of exercise prescription that the participant will  acknowledge.   Education: Aerobic Exercise: - Group verbal and visual presentation on the components of exercise prescription. Introduces F.I.T.T principle from ACSM for exercise prescriptions.  Reviews F.I.T.T. principles of aerobic exercise including progression. Written material given at graduation.   Education: Resistance Exercise: - Group verbal and visual presentation on the components of exercise prescription. Introduces F.I.T.T principle from ACSM for exercise prescriptions  Reviews F.I.T.T. principles of resistance exercise including progression. Written material given at graduation.    Education: Exercise & Equipment Safety: - Individual verbal instruction and demonstration of equipment use and safety with use of the equipment. Flowsheet Row Cardiac Rehab  from 01/05/2022 in [facility] Cardiac and Pulmonary Rehab  Date 01/05/22  Educator [facility]  Instruction Review Code 1- Verbalizes Understanding       Education: Exercise Physiology & General Exercise Guidelines: - Group verbal and written instruction with models to review the exercise physiology of the cardiovascular system and associated critical values. Provides general exercise guidelines with specific guidelines to those with heart or lung disease.  Flowsheet Row Cardiac Rehab from 01/05/2022 in [facility] Cardiac and Pulmonary Rehab  Education need identified 01/05/22       Education: Flexibility, Balance, Mind/Body Relaxation: - Group verbal and visual presentation with interactive activity on the components of exercise prescription. Introduces F.I.T.T principle from ACSM for exercise prescriptions. Reviews F.I.T.T. principles of flexibility and balance exercise training including progression. Also discusses the mind body connection.  Reviews various relaxation techniques to help reduce and manage stress (i.e. Deep breathing, progressive muscle relaxation, and visualization). Balance handout provided to take home. Written material given at graduation.   Activity Barriers & Risk Stratification:  Activity Barriers & Cardiac Risk Stratification - 01/05/22 1506       Activity Barriers & Cardiac Risk Stratification   Activity Barriers Left Knee Replacement;Right Knee Replacement    Cardiac Risk Stratification Moderate             6 Minute Walk:  6 Minute Walk     Row Name 01/05/22 1505         6 Minute Walk   Phase Initial     Distance 1615 feet     Walk Time 6 minutes     # of Rest Breaks 0     MPH 3.06     METS 3.52     RPE 8     Perceived Dyspnea  0     VO2 Peak 12.32     Symptoms No     Resting HR 56 bpm     Resting BP 122/84     Resting Oxygen Saturation  98 %     Exercise Oxygen Saturation  during 6 min walk 98 %     Max Ex. HR 101 bpm     Max Ex. BP 158/80     2  Minute Post BP 124/62              Oxygen Initial Assessment:   Oxygen Re-Evaluation:   Oxygen Discharge (Final Oxygen Re-Evaluation):   Initial Exercise Prescription:  Initial Exercise Prescription - 01/05/22 1500       Date of Initial Exercise RX and Referring Provider   Date 01/05/22    Referring Provider Arida      Oxygen   Maintain Oxygen Saturation 88% or higher      Treadmill   MPH 3    Grade 1    Minutes 15    METs 3.71      Recumbant  Bike   Level 3    RPM 50    Minutes 15    METs 3.52      Elliptical   Level 1    Speed 3    Minutes 15    METs 3.52      REL-XR   Level 3    Speed 50    Minutes 15    METs 3.52      Biostep-RELP   Level 3    SPM 50    Minutes 15    METs 3.52      Track   Laps 42    Minutes 15    METs 3.28      Prescription Details   Frequency (times per week) 3    Duration Progress to 30 minutes of continuous aerobic without signs/symptoms of physical distress      Intensity   THRR 40-80% of Max Heartrate 100-144    Ratings of Perceived Exertion 11-13    Perceived Dyspnea 0-4      Progression   Progression Continue to progress workloads to maintain intensity without signs/symptoms of physical distress.      Resistance Training   Training Prescription Yes    Weight 5    Reps 10-15             Perform Capillary Blood Glucose checks as needed.  Exercise Prescription Changes:   Exercise Prescription Changes     Row Name 01/05/22 1500             Response to Exercise   Blood Pressure (Admit) 122/84       Blood Pressure (Exercise) 158/80       Blood Pressure (Exit) 124/62       Heart Rate (Admit) 56 bpm       Heart Rate (Exercise) 101 bpm       Heart Rate (Exit) 55 bpm       Oxygen Saturation (Admit) 98 %       Oxygen Saturation (Exercise) 98 %       Oxygen Saturation (Exit) 98 %       Rating of Perceived Exertion (Exercise) 8       Perceived Dyspnea (Exercise) 0       Symptoms none        Comments 6 MWT results                Exercise Comments:   Exercise Goals and Review:   Exercise Goals     Row Name 01/05/22 1513             Exercise Goals   Increase Physical Activity Yes       Intervention Provide advice, education, support and counseling about physical activity/exercise needs.;Develop an individualized exercise prescription for aerobic and resistive training based on initial evaluation findings, risk stratification, comorbidities and participant's personal goals.       Expected Outcomes Short Term: Attend rehab on a regular basis to increase amount of physical activity.;Long Term: Add in home exercise to make exercise part of routine and to increase amount of physical activity.;Long Term: Exercising regularly at least 3-5 days a week.       Increase Strength and Stamina Yes       Intervention Provide advice, education, support and counseling about physical activity/exercise needs.;Develop an individualized exercise prescription for aerobic and resistive training based on initial evaluation findings, risk stratification, comorbidities and participant's personal goals.       Expected Outcomes Short  Term: Increase workloads from initial exercise prescription for resistance, speed, and METs.;Short Term: Perform resistance training exercises routinely during rehab and add in resistance training at home;Long Term: Improve cardiorespiratory fitness, muscular endurance and strength as measured by increased METs and functional capacity (6MWT)       Able to understand and use rate of perceived exertion (RPE) scale Yes       Intervention Provide education and explanation on how to use RPE scale       Expected Outcomes Short Term: Able to use RPE daily in rehab to express subjective intensity level;Long Term:  Able to use RPE to guide intensity level when exercising independently       Able to understand and use Dyspnea scale Yes       Intervention Provide education and  explanation on how to use Dyspnea scale       Expected Outcomes Short Term: Able to use Dyspnea scale daily in rehab to express subjective sense of shortness of breath during exertion;Long Term: Able to use Dyspnea scale to guide intensity level when exercising independently       Knowledge and understanding of Target Heart Rate Range (THRR) Yes       Intervention Provide education and explanation of THRR including how the numbers were predicted and where they are located for reference       Expected Outcomes Short Term: Able to state/look up THRR;Long Term: Able to use THRR to govern intensity when exercising independently;Short Term: Able to use daily as guideline for intensity in rehab       Able to check pulse independently Yes       Intervention Provide education and demonstration on how to check pulse in carotid and radial arteries.;Review the importance of being able to check your own pulse for safety during independent exercise       Expected Outcomes Short Term: Able to explain why pulse checking is important during independent exercise;Long Term: Able to check pulse independently and accurately       Understanding of Exercise Prescription Yes       Intervention Provide education, explanation, and written materials on patient's individual exercise prescription       Expected Outcomes Short Term: Able to explain program exercise prescription;Long Term: Able to explain home exercise prescription to exercise independently                Exercise Goals Re-Evaluation :   Discharge Exercise Prescription (Final Exercise Prescription Changes):  Exercise Prescription Changes - 01/05/22 1500       Response to Exercise   Blood Pressure (Admit) 122/84    Blood Pressure (Exercise) 158/80    Blood Pressure (Exit) 124/62    Heart Rate (Admit) 56 bpm    Heart Rate (Exercise) 101 bpm    Heart Rate (Exit) 55 bpm    Oxygen Saturation (Admit) 98 %    Oxygen Saturation (Exercise) 98 %    Oxygen  Saturation (Exit) 98 %    Rating of Perceived Exertion (Exercise) 8    Perceived Dyspnea (Exercise) 0    Symptoms none    Comments 6 MWT results             Nutrition:  Target Goals: Understanding of nutrition guidelines, daily intake of sodium 1500mg , cholesterol 200mg , calories 30% from fat and 7% or less from saturated fats, daily to have 5 or more servings of fruits and vegetables.  Education: All About Nutrition: -Group instruction provided by verbal, written material,  interactive activities, discussions, models, and posters to present general guidelines for heart healthy nutrition including fat, fiber, MyPlate, the role of sodium in heart healthy nutrition, utilization of the nutrition label, and utilization of this knowledge for meal planning. Follow up email sent as well. Written material given at graduation. Flowsheet Row Cardiac Rehab from 01/05/2022 in [facility] Cardiac and Pulmonary Rehab  Education need identified 01/05/22       Biometrics:  Pre Biometrics - 01/05/22 1514       Pre Biometrics   Height 5' 9.5" (1.765 m)    Weight 252 lb 9.6 oz (114.6 kg)    Waist Circumference 49.5 inches    Hip Circumference 48 inches    Waist to Hip Ratio 1.03 %    BMI (Calculated) 36.78    Single Leg Stand 30 seconds              Nutrition Therapy Plan and Nutrition Goals:  Nutrition Therapy & Goals - 01/05/22 1426       Nutrition Therapy   Diet Heart healthy, low Na    Drug/Food Interactions Statins/Certain Fruits    Protein (specify units) 85-95g    Fiber 30 grams    Whole Grain Foods 3 servings    Saturated Fats 16 max. grams    Fruits and Vegetables 8 servings/day    Sodium 2 grams      Personal Nutrition Goals   Comments 51 y.o. M admitted to cardiac rehab s/p NSTEMI. PMHx includes HTN, prediabetes, HLD, gout, CAD, vit D deficiency. Relevant medications includes lipitor, calcium/magnesium/zinc, vit D3. 3x/week will eat at work; he works at [employer]. They have  donated many canned items higher in sodium and have been cutting back on sodium in general. they have switched to honey-wheat bread and now having oatmeal with berries. He was eating 2 meals and is now eating 3 meals per day. Discussed heart healthy eating and gout friendly eating.      Intervention Plan   Intervention Prescribe, educate and counsel regarding individualized specific dietary modifications aiming towards targeted core components such as weight, hypertension, lipid management, diabetes, heart failure and other comorbidities.;Nutrition handout(s) given to patient.    Expected Outcomes Short Term Goal: Understand basic principles of dietary content, such as calories, fat, sodium, cholesterol and nutrients.;Short Term Goal: A plan has been developed with personal nutrition goals set during dietitian appointment.;Long Term Goal: Adherence to prescribed nutrition plan.             Nutrition Assessments:  MEDIFICTS Score Key: ?70 Need to make dietary changes  40-70 Heart Healthy Diet ? 40 Therapeutic Level Cholesterol Diet  Flowsheet Row Cardiac Rehab from 01/05/2022 in [facility] Cardiac and Pulmonary Rehab  Picture Your Plate Total Score on Admission 45      Picture Your Plate Scores: [id number] Unhealthy dietary pattern with much room for improvement. 41-50 Dietary pattern unlikely to meet recommendations for good health and room for improvement. 51-60 More healthful dietary pattern, with some room for improvement.  >60 Healthy dietary pattern, although there may be some specific behaviors that could be improved.    Nutrition Goals Re-Evaluation:   Nutrition Goals Discharge (Final Nutrition Goals Re-Evaluation):   Psychosocial: Target Goals: Acknowledge presence or absence of significant depression and/or stress, maximize coping skills, provide positive support system. Participant is able to verbalize types and ability to use techniques and skills needed for reducing stress and  depression.   Education: Stress, Anxiety, and Depression - Group verbal and visual presentation  to define topics covered.  Reviews how body is impacted by stress, anxiety, and depression.  Also discusses healthy ways to reduce stress and to treat/manage anxiety and depression.  Written material given at graduation.   Education: Sleep Hygiene -Provides group verbal and written instruction about how sleep can affect your health.  Define sleep hygiene, discuss sleep cycles and impact of sleep habits. Review good sleep hygiene tips.    Initial Review & Psychosocial Screening:  Initial Psych Review & Screening - 12/30/21 1307       Initial Review   Current issues with None Identified      Family Dynamics   Good Support System? Yes   wife, family     Barriers   Psychosocial barriers to participate in program There are no identifiable barriers or psychosocial needs.;The patient should benefit from training in stress management and relaxation.      Screening Interventions   Interventions Encouraged to exercise;Provide feedback about the scores to participant;To provide support and resources with identified psychosocial needs    Expected Outcomes Short Term goal: Utilizing psychosocial counselor, staff and physician to assist with identification of specific Stressors or current issues interfering with healing process. Setting desired goal for each stressor or current issue identified.;Short Term goal: Identification and review with participant of any Quality of Life or Depression concerns found by scoring the questionnaire.;Long Term Goal: Stressors or current issues are controlled or eliminated.;Long Term goal: The participant improves quality of Life and PHQ9 Scores as seen by post scores and/or verbalization of changes             Quality of Life Scores:   Quality of Life - 01/05/22 1516       Quality of Life   Select Quality of Life      Quality of Life Scores   Health/Function  Pre 28 %    Socioeconomic Pre 27.19 %    Psych/Spiritual Pre 30 %    Family Pre 30 %    GLOBAL Pre 28.5 %            Scores of 19 and below usually indicate a poorer quality of life in these areas.  A difference of  2-3 points is a clinically meaningful difference.  A difference of 2-3 points in the total score of the Quality of Life Index has been associated with significant improvement in overall quality of life, self-image, physical symptoms, and general health in studies assessing change in quality of life.  PHQ-9: Review Flowsheet  More data exists      01/05/2022 12/26/2021 12/24/2020 12/20/2019 11/10/2018  Depression screen PHQ 2/9  Decreased Interest 0 0 0 0 0  Down, Depressed, Hopeless 0 0 0 0 0  PHQ - 2 Score 0 0 0 0 0  Altered sleeping 0 - - - -  Tired, decreased energy 0 - - - -  Change in appetite 0 - - - -  Feeling bad or failure about yourself  0 - - - -  Moving slowly or fidgety/restless 0 - - - -  Suicidal thoughts 0 - - - -  PHQ-9 Score 0 - - - -  Difficult doing work/chores Not difficult at all - - - -   Interpretation of Total Score  Total Score Depression Severity:  1-4 = Minimal depression, 5-9 = Mild depression, 10-14 = Moderate depression, 15-19 = Moderately severe depression, 20-27 = Severe depression   Psychosocial Evaluation and Intervention:  Psychosocial Evaluation - 12/30/21 1313  Psychosocial Evaluation & Interventions   Interventions Encouraged to exercise with the program and follow exercise prescription    Comments Eric Huff 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 He reports feeling well and wanting to increase his activity level. He thinks he will be out of work for 2 months. 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 He reports no stress concerns. His wife and family are very supportive. He is looking forward to learning more about the nutritional aspect of heart healthy living as that is his  and his wife's main topic of discussion    Expected Outcomes Short: attend cardiac rehab for education and exercise. Long: develop and maintain positive self care habits.    Continue Psychosocial Services  Follow up required by staff             Psychosocial Re-Evaluation:   Psychosocial Discharge (Final Psychosocial Re-Evaluation):   Vocational Rehabilitation: Provide vocational rehab assistance to qualifying candidates.   Vocational Rehab Evaluation & Intervention:  Vocational Rehab - 12/30/21 1307       Initial Vocational Rehab Evaluation & Intervention   Assessment shows need for Vocational Rehabilitation No             Education: Education Goals: Education classes will be provided on a variety of topics geared toward better understanding of heart health and risk factor modification. Participant will state understanding/return demonstration of topics presented as noted by education test scores.  Learning Barriers/Preferences:  Learning Barriers/Preferences - 12/30/21 1307       Learning Barriers/Preferences   Learning Barriers None    Learning Preferences None             General Cardiac Education Topics:  AED/CPR: - Group verbal and written instruction with the use of models to demonstrate the basic use of the AED with the basic ABC's of resuscitation.   Anatomy and Cardiac Procedures: - Group verbal and visual presentation and models provide information about basic cardiac anatomy and function. Reviews the testing methods done to diagnose heart disease and the outcomes of the test results. Describes the treatment choices: Medical Management, Angioplasty, or Coronary Bypass Surgery for treating various heart conditions including Myocardial Infarction, Angina, Valve Disease, and Cardiac Arrhythmias.  Written material given at graduation.   Medication Safety: - Group verbal and visual instruction to review commonly prescribed medications for heart and  lung disease. Reviews the medication, class of the drug, and side effects. Includes the steps to properly store meds and maintain the prescription regimen.  Written material given at graduation.   Intimacy: - Group verbal instruction through game format to discuss how heart and lung disease can affect sexual intimacy. Written material given at graduation..   Know Your Numbers and Heart Failure: - Group verbal and visual instruction to discuss disease risk factors for cardiac and pulmonary disease and treatment options.  Reviews associated critical values for Overweight/Obesity, Hypertension, Cholesterol, and Diabetes.  Discusses basics of heart failure: signs/symptoms and treatments.  Introduces Heart Failure Zone chart for action plan for heart failure.  Written material given at graduation.   Infection Prevention: - Provides verbal and written material to individual with discussion of infection control including proper hand washing and proper equipment cleaning during exercise session. Flowsheet Row Cardiac Rehab from 01/05/2022 in [facility] Cardiac and Pulmonary Rehab  Date 01/05/22  Educator [facility]  Instruction Review Code 1- Verbalizes Understanding       Falls Prevention: - Provides  verbal and written material to individual with discussion of falls prevention and safety. Flowsheet Row Cardiac Rehab from 01/05/2022 in [facility] Cardiac and Pulmonary Rehab  Date 01/05/22  Educator [facility]  Instruction Review Code 1- Verbalizes Understanding       Other: -Provides group and verbal instruction on various topics (see comments)   Knowledge Questionnaire Score:  Knowledge Questionnaire Score - 01/05/22 1517       Knowledge Questionnaire Score   Pre Score 24/26             Core Components/Risk Factors/Patient Goals at Admission:  Personal Goals and Risk Factors at Admission - 01/05/22 1517       Core Components/Risk Factors/Patient Goals on Admission    Weight Management Yes;Weight  Loss    Intervention Weight Management: Develop a combined nutrition and exercise program designed to reach desired caloric intake, while maintaining appropriate intake of nutrient and fiber, sodium and fats, and appropriate energy expenditure required for the weight goal.;Weight Management: Provide education and appropriate resources to help participant work on and attain dietary goals.;Weight Management/Obesity: Establish reasonable short term and long term weight goals.;Obesity: Provide education and appropriate resources to help participant work on and attain dietary goals.    Admit Weight 252 lb 9.6 oz (114.6 kg)    Goal Weight: Short Term 245 lb (111.1 kg)    Goal Weight: Long Term 200 lb (90.7 kg)    Expected Outcomes Short Term: Continue to assess and modify interventions until short term weight is achieved;Long Term: Adherence to nutrition and physical activity/exercise program aimed toward attainment of established weight goal;Weight Loss: Understanding of general recommendations for a balanced deficit meal plan, which promotes 1-2 lb weight loss per week and includes a negative energy balance of [phone] kcal/d;Understanding recommendations for meals to include 15-35% energy as protein, 25-35% energy from fat, 35-60% energy from carbohydrates, less than 200mg  of dietary cholesterol, 20-35 gm of total fiber daily;Understanding of distribution of calorie intake throughout the day with the consumption of 4-5 meals/snacks    Hypertension Yes    Intervention Provide education on lifestyle modifcations including regular physical activity/exercise, weight management, moderate sodium restriction and increased consumption of fresh fruit, vegetables, and low fat dairy, alcohol moderation, and smoking cessation.;Monitor prescription use compliance.    Expected Outcomes Short Term: Continued assessment and intervention until BP is < 140/47mm HG in hypertensive participants. < 130/69mm HG in hypertensive  participants with diabetes, heart failure or chronic kidney disease.;Long Term: Maintenance of blood pressure at goal levels.    Lipids Yes    Intervention Provide education and support for participant on nutrition & aerobic/resistive exercise along with prescribed medications to achieve LDL 70mg , HDL >40mg .    Expected Outcomes Short Term: Participant states understanding of desired cholesterol values and is compliant with medications prescribed. Participant is following exercise prescription and nutrition guidelines.;Long Term: Cholesterol controlled with medications as prescribed, with individualized exercise RX and with personalized nutrition plan. Value goals: LDL < 70mg , HDL > 40 mg.             Education:Diabetes - Individual verbal and written instruction to review signs/symptoms of diabetes, desired ranges of glucose level fasting, after meals and with exercise. Acknowledge that pre and post exercise glucose checks will be done for 3 sessions at entry of program.   Core Components/Risk Factors/Patient Goals Review:    Core Components/Risk Factors/Patient Goals at Discharge (Final Review):    ITP Comments:  ITP Comments     Row Name 12/30/21  1320 01/05/22 1505         ITP Comments Initial telephone orientation completed. Diagnosis can be found in [facility] 11/22. EP orientation scheduled for Monday 12/11 at 1:30. Completed 6MWT and gym orientation. Initial ITP created and sent for review to Dr. Emily Filbert, Medical Director.               Comments: initial ITP

## 2022-01-07 ENCOUNTER — Encounter: Payer: BC Managed Care – PPO | Admitting: *Deleted

## 2022-01-07 DIAGNOSIS — I252 Old myocardial infarction: Secondary | ICD-10-CM | POA: Diagnosis not present

## 2022-01-07 DIAGNOSIS — Z955 Presence of coronary angioplasty implant and graft: Secondary | ICD-10-CM

## 2022-01-07 DIAGNOSIS — Z48812 Encounter for surgical aftercare following surgery on the circulatory system: Secondary | ICD-10-CM | POA: Diagnosis not present

## 2022-01-07 DIAGNOSIS — I214 Non-ST elevation (NSTEMI) myocardial infarction: Secondary | ICD-10-CM

## 2022-01-07 NOTE — Progress Notes (Signed)
Daily Session Note  Patient Details  Name: JETSON PICKREL MRN: [id number] Date of Birth: 1970-06-27 Referring Provider:   Flowsheet Row Cardiac Rehab from 01/05/2022 in [facility] Cardiac and Pulmonary Rehab  Referring Provider Fletcher Anon       Encounter Date: 01/07/2022  Check In:  Session Check In - 01/07/22 1041       Check-In   Supervising physician immediately available to respond to emergencies See telemetry face sheet for immediately available ER MD    Location [facility]-Cardiac & Pulmonary Rehab    Staff Present Darlyne Russian, RN, Dimple Nanas, BS, Exercise Physiologist;Epifanio Tessie Fass, [place]    Virtual Visit No    Medication changes reported     No    Fall or balance concerns reported    No    Warm-up and Cool-down Performed on first and last piece of equipment    Resistance Training Performed Yes    VAD Patient? No    PAD/SET Patient? No      Pain Assessment   Currently in Pain? No/denies                Social History   Tobacco Use  Smoking Status Former   Types: Cigarettes, Cigars   Quit date: 12/19/2021   Years since quitting: 0.0  Smokeless Tobacco Former  Tobacco Comments   Quit cigarettes in 2009.  Quit cigars in November 2023    Goals Met:  Independence with exercise equipment Exercise tolerated well No report of concerns or symptoms today Strength training completed today  Goals Unmet:  Not Applicable  Comments: First full day of exercise!  Patient was oriented to gym and equipment including functions, settings, policies, and procedures.  Patient's individual exercise prescription and treatment plan were reviewed.  All starting workloads were established based on the results of the 6 minute walk test done at initial orientation visit.  The plan for exercise progression was also introduced and progression will be customized based on patient's performance and goals.    Dr. Emily Filbert is Medical Director for [place].   Dr. Ottie Glazier is Medical Director for [facility] Pulmonary Rehabilitation.

## 2022-01-09 ENCOUNTER — Encounter: Payer: BC Managed Care – PPO | Admitting: *Deleted

## 2022-01-09 DIAGNOSIS — Z955 Presence of coronary angioplasty implant and graft: Secondary | ICD-10-CM | POA: Diagnosis not present

## 2022-01-09 DIAGNOSIS — Z48812 Encounter for surgical aftercare following surgery on the circulatory system: Secondary | ICD-10-CM | POA: Diagnosis not present

## 2022-01-09 DIAGNOSIS — I214 Non-ST elevation (NSTEMI) myocardial infarction: Secondary | ICD-10-CM

## 2022-01-09 DIAGNOSIS — I252 Old myocardial infarction: Secondary | ICD-10-CM | POA: Diagnosis not present

## 2022-01-09 NOTE — Progress Notes (Signed)
Daily Session Note  Patient Details  Name: AKSHATH MCCAREY MRN: [id number] Date of Birth: 17-Jan-1971 Referring Provider:   Flowsheet Row Cardiac Rehab from 01/05/2022 in [facility] Cardiac and Pulmonary Rehab  Referring Provider Fletcher Anon       Encounter Date: 01/09/2022  Check In:  Session Check In - 01/09/22 1028       Check-In   Supervising physician immediately available to respond to emergencies See telemetry face sheet for immediately available ER MD    Location [facility]-Cardiac & Pulmonary Rehab    Staff Present Heath Lark, RN, BSN, CCRP;Jessica [place], MA, RCEP, CCRP, CCET;Nirav [place], [place]    Virtual Visit No    Medication changes reported     No    Fall or balance concerns reported    No    Warm-up and Cool-down Performed on first and last piece of equipment    Resistance Training Performed Yes    VAD Patient? No    PAD/SET Patient? No      Pain Assessment   Currently in Pain? No/denies                Social History   Tobacco Use  Smoking Status Former   Types: Cigarettes, Cigars   Quit date: 12/19/2021   Years since quitting: 0.0  Smokeless Tobacco Former  Tobacco Comments   Quit cigarettes in 2009.  Quit cigars in November 2023    Goals Met:  Independence with exercise equipment Exercise tolerated well No report of concerns or symptoms today  Goals Unmet:  Not Applicable  Comments: Pt able to follow exercise prescription today without complaint.  Will continue to monitor for progression.    Dr. Emily Filbert is Medical Director for [place].  Dr. Ottie Glazier is Medical Director for [facility] Pulmonary Rehabilitation.

## 2022-01-12 ENCOUNTER — Encounter: Payer: BC Managed Care – PPO | Admitting: *Deleted

## 2022-01-12 DIAGNOSIS — I252 Old myocardial infarction: Secondary | ICD-10-CM | POA: Diagnosis not present

## 2022-01-12 DIAGNOSIS — Z48812 Encounter for surgical aftercare following surgery on the circulatory system: Secondary | ICD-10-CM | POA: Diagnosis not present

## 2022-01-12 DIAGNOSIS — Z955 Presence of coronary angioplasty implant and graft: Secondary | ICD-10-CM | POA: Diagnosis not present

## 2022-01-12 DIAGNOSIS — I214 Non-ST elevation (NSTEMI) myocardial infarction: Secondary | ICD-10-CM

## 2022-01-12 NOTE — Progress Notes (Signed)
Daily Session Note  Patient Details  Name: Eric Huff MRN: [id number] Date of Birth: Jun 04, 1970 Referring Provider:   Flowsheet Row Cardiac Rehab from 01/05/2022 in [facility] Cardiac and Pulmonary Rehab  Referring Provider Arida       Encounter Date: 01/12/2022  Check In:  Session Check In - 01/12/22 0953       Check-In   Supervising physician immediately available to respond to emergencies See telemetry face sheet for immediately available ER MD    Location [facility]-Cardiac & Pulmonary Rehab    Staff Present Darlyne Russian, RN, Doyce Para, BS, ACSM CEP, Exercise Physiologist;Bertis Tessie Fass, [place]    Virtual Visit No    Medication changes reported     No    Fall or balance concerns reported    No    Warm-up and Cool-down Performed on first and last piece of equipment    Resistance Training Performed Yes    VAD Patient? No    PAD/SET Patient? No      Pain Assessment   Currently in Pain? No/denies                Social History   Tobacco Use  Smoking Status Former   Types: Cigarettes, Cigars   Quit date: 12/19/2021   Years since quitting: 0.0  Smokeless Tobacco Former  Tobacco Comments   Quit cigarettes in 2009.  Quit cigars in November 2023    Goals Met:  Independence with exercise equipment Exercise tolerated well No report of concerns or symptoms today Strength training completed today  Goals Unmet:  Not Applicable  Comments: Pt able to follow exercise prescription today without complaint.  Will continue to monitor for progression.    Dr. Emily Filbert is Medical Director for [place].  Dr. Ottie Glazier is Medical Director for [facility] Pulmonary Rehabilitation.

## 2022-01-14 ENCOUNTER — Encounter: Payer: BC Managed Care – PPO | Admitting: *Deleted

## 2022-01-14 DIAGNOSIS — Z955 Presence of coronary angioplasty implant and graft: Secondary | ICD-10-CM | POA: Diagnosis not present

## 2022-01-14 DIAGNOSIS — Z48812 Encounter for surgical aftercare following surgery on the circulatory system: Secondary | ICD-10-CM | POA: Diagnosis not present

## 2022-01-14 DIAGNOSIS — I214 Non-ST elevation (NSTEMI) myocardial infarction: Secondary | ICD-10-CM

## 2022-01-14 DIAGNOSIS — I252 Old myocardial infarction: Secondary | ICD-10-CM | POA: Diagnosis not present

## 2022-01-14 NOTE — Progress Notes (Signed)
Daily Session Note  Patient Details  Name: Eric Huff MRN: [id number] Date of Birth: [birth year] Referring Provider:   Flowsheet Row Cardiac Rehab from 01/05/2022 in [facility] Cardiac and Pulmonary Rehab  Referring Provider Arida       Encounter Date: 01/14/2022  Check In:  Session Check In - 01/14/22 0956       Check-In   Supervising physician immediately available to respond to emergencies See telemetry face sheet for immediately available ER MD    Location [facility]-Cardiac & Pulmonary Rehab    Staff Present Renita Papa, RN BSN;Ghazi Tessie Fass, RCP,RRT,BSRT;Noah [place], [place], Exercise Physiologist    Virtual Visit No    Medication changes reported     No    Fall or balance concerns reported    No    Warm-up and Cool-down Performed on first and last piece of equipment    Resistance Training Performed Yes    VAD Patient? No    PAD/SET Patient? No      Pain Assessment   Currently in Pain? No/denies                Social History   Tobacco Use  Smoking Status Former   Types: Cigarettes, Cigars   Quit date: 12/19/2021   Years since quitting: 0.0  Smokeless Tobacco Former  Tobacco Comments   Quit cigarettes in 2009.  Quit cigars in November 2023    Goals Met:  Independence with exercise equipment Exercise tolerated well No report of concerns or symptoms today Strength training completed today  Goals Unmet:  Not Applicable  Comments: Pt able to follow exercise prescription today without complaint.  Will continue to monitor for progression.    Dr. Emily Filbert is Medical Director for [place].  Dr. Ottie Glazier is Medical Director for [facility] Pulmonary Rehabilitation.

## 2022-01-16 ENCOUNTER — Encounter: Payer: BC Managed Care – PPO | Admitting: *Deleted

## 2022-01-16 DIAGNOSIS — Z48812 Encounter for surgical aftercare following surgery on the circulatory system: Secondary | ICD-10-CM | POA: Diagnosis not present

## 2022-01-16 DIAGNOSIS — I214 Non-ST elevation (NSTEMI) myocardial infarction: Secondary | ICD-10-CM

## 2022-01-16 DIAGNOSIS — Z955 Presence of coronary angioplasty implant and graft: Secondary | ICD-10-CM

## 2022-01-16 DIAGNOSIS — I252 Old myocardial infarction: Secondary | ICD-10-CM | POA: Diagnosis not present

## 2022-01-16 NOTE — Progress Notes (Signed)
Daily Session Note  Patient Details  Name: KALEL HARTY MRN: [id number] Date of Birth: 21-Feb-1970 Referring Provider:   Flowsheet Row Cardiac Rehab from 01/05/2022 in [facility] Cardiac and Pulmonary Rehab  Referring Provider Fletcher Anon       Encounter Date: 01/16/2022  Check In:  Session Check In - 01/16/22 1033       Check-In   Supervising physician immediately available to respond to emergencies See telemetry face sheet for immediately available ER MD    Location [facility]-Cardiac & Pulmonary Rehab    Staff Present Heath Lark, RN, BSN, CCRP;Jessica [place], MA, RCEP, CCRP, CCET;Rayven [place], [place]    Virtual Visit No    Medication changes reported     No    Fall or balance concerns reported    No    Warm-up and Cool-down Performed on first and last piece of equipment    Resistance Training Performed Yes    VAD Patient? No    PAD/SET Patient? No      Pain Assessment   Currently in Pain? No/denies                Social History   Tobacco Use  Smoking Status Former   Types: Cigarettes, Cigars   Quit date: 12/19/2021   Years since quitting: 0.0  Smokeless Tobacco Former  Tobacco Comments   Quit cigarettes in 2009.  Quit cigars in November 2023    Goals Met:  Proper associated with RPD/PD & O2 Sat Exercise tolerated well No report of concerns or symptoms today  Goals Unmet:  Not Applicable  Comments: Pt able to follow exercise prescription today without complaint.  Will continue to monitor for progression.    Dr. Emily Filbert is Medical Director for [place].  Dr. Ottie Glazier is Medical Director for [facility] Pulmonary Rehabilitation.

## 2022-01-21 ENCOUNTER — Encounter: Payer: Self-pay | Admitting: *Deleted

## 2022-01-21 ENCOUNTER — Encounter: Payer: BC Managed Care – PPO | Admitting: *Deleted

## 2022-01-21 DIAGNOSIS — I214 Non-ST elevation (NSTEMI) myocardial infarction: Secondary | ICD-10-CM

## 2022-01-21 DIAGNOSIS — Z955 Presence of coronary angioplasty implant and graft: Secondary | ICD-10-CM

## 2022-01-21 DIAGNOSIS — Z48812 Encounter for surgical aftercare following surgery on the circulatory system: Secondary | ICD-10-CM | POA: Diagnosis not present

## 2022-01-21 DIAGNOSIS — I252 Old myocardial infarction: Secondary | ICD-10-CM | POA: Diagnosis not present

## 2022-01-21 NOTE — Progress Notes (Signed)
Daily Session Note  Patient Details  Name: Eric Huff MRN: [id number] Date of Birth: [birth year] Referring Provider:   Flowsheet Row Cardiac Rehab from 01/05/2022 in [facility] Cardiac and Pulmonary Rehab  Referring Provider Fletcher Anon       Encounter Date: 01/21/2022  Check In:  Session Check In - 01/21/22 0955       Check-In   Supervising physician immediately available to respond to emergencies See telemetry face sheet for immediately available ER MD    Location [facility]-Cardiac & Pulmonary Rehab    Staff Present Darlyne Russian, RN, ADN;Jessica Luan Pulling, MA, RCEP, CCRP, CCET;Noah Tickle, BS, Exercise Physiologist    Virtual Visit No    Medication changes reported     No    Fall or balance concerns reported    No    Warm-up and Cool-down Performed on first and last piece of equipment    Resistance Training Performed Yes    VAD Patient? No    PAD/SET Patient? No      Pain Assessment   Currently in Pain? No/denies                Social History   Tobacco Use  Smoking Status Former   Types: Cigarettes, Cigars   Quit date: 12/19/2021   Years since quitting: 0.0  Smokeless Tobacco Former  Tobacco Comments   Quit cigarettes in 2009.  Quit cigars in November 2023    Goals Met:  Independence with exercise equipment Exercise tolerated well No report of concerns or symptoms today Strength training completed today  Goals Unmet:  Not Applicable  Comments: Pt able to follow exercise prescription today without complaint.  Will continue to monitor for progression.    Dr. Emily Filbert is Medical Director for [facility].  Dr. Ottie Glazier is Medical Director for [facility] Pulmonary Rehabilitation.

## 2022-01-21 NOTE — Progress Notes (Signed)
Cardiac Individual Treatment Plan  Patient Details  Name: Eric Huff MRN: [id number] Date of Birth: [birth year] Referring Provider:   Flowsheet Row Cardiac Rehab from 01/05/2022 in [facility] Cardiac and Pulmonary Rehab  Referring Provider Arida       Initial Encounter Date:  Flowsheet Row Cardiac Rehab from 01/05/2022 in [facility] Cardiac and Pulmonary Rehab  Date 01/05/22       Visit Diagnosis: NSTEMI (non-ST elevated myocardial infarction) [facility])  Status post coronary artery stent placement  Patient's Home Medications on Admission:  Current Outpatient Medications:    allopurinol (ZYLOPRIM) 100 MG tablet, Take 1 tablet (100 mg total) by mouth daily., Disp: 30 tablet, Rfl: 6   aspirin EC 81 MG tablet, Take 1 tablet (81 mg total) by mouth daily. Swallow whole., Disp: , Rfl:    atorvastatin (LIPITOR) 80 MG tablet, Take 1 tablet (80 mg total) by mouth daily., Disp: 30 tablet, Rfl: 2   CALCIUM-MAGNESIUM-ZINC PO, Take 3 tablets by mouth daily., Disp: , Rfl:    carvedilol (COREG) 3.125 MG tablet, Take 1 tablet (3.125 mg total) by mouth 2 (two) times daily with a meal., Disp: 60 tablet, Rfl: 2   Cholecalciferol (VITAMIN D3) 125 MCG (5000 UT) CAPS, Take 1 capsule by mouth daily., Disp: , Rfl:    colchicine 0.6 MG tablet, Take 1 tablet (0.6 mg total) by mouth daily as needed. For gout flares, may take 2 tablets on the first day of flare, Disp: 30 tablet, Rfl: 3   lisinopril (ZESTRIL) 10 MG tablet, Take 1 tablet (10 mg total) by mouth daily., Disp: 90 tablet, Rfl: 1   loratadine (CLARITIN) 10 MG tablet, Take 10 mg by mouth daily., Disp: , Rfl:    prasugrel (EFFIENT) 10 MG TABS tablet, Take 1 tablet (10 mg total) by mouth daily., Disp: 30 tablet, Rfl: 11  Past Medical History: Past Medical History:  Diagnosis Date   Gout    Hypertension 12/26/2005   Morbid obesity (HCC)    Plantar fascia rupture 08/29/2010   Tobacco use    a. Smokes 1 cigar daily.    Tobacco Use: Social History    Tobacco Use  Smoking Status Former   Types: Cigarettes, Cigars   Quit date: 12/19/2021   Years since quitting: 0.0  Smokeless Tobacco Former  Tobacco Comments   Quit cigarettes in 2009.  Quit cigars in November 2023    Labs: Review Flowsheet  More data exists      Latest Ref Rng & Units 11/07/2018 12/15/2019 12/17/2020 12/16/2021 12/18/2021  Labs for ITP Cardiac and Pulmonary Rehab  Cholestrol 0 - 200 mg/dL 443  154  008  676  195   LDL (calc) 0 - 99 mg/dL 96  093  267  - 124   Direct LDL mg/dL - - - 580.9  -  HDL-C >98 mg/dL 33.82  50.53  97.67  34.19  34   Trlycerides <150 mg/dL 379.0  240.9  735.3  299.2  181   Hemoglobin A1c 4.6 - 6.5 % 5.8  5.7  5.9  5.9  -     Exercise Target Goals: Exercise Program Goal: Individual exercise prescription set using results from initial 6 min walk test and THRR while considering  patient's activity barriers and safety.   Exercise Prescription Goal: Initial exercise prescription builds to 30-45 minutes a day of aerobic activity, 2-3 days per week.  Home exercise guidelines will be given to patient during program as part of exercise prescription that the participant will  acknowledge.   Education: Aerobic Exercise: - Group verbal and visual presentation on the components of exercise prescription. Introduces F.I.T.T principle from ACSM for exercise prescriptions.  Reviews F.I.T.T. principles of aerobic exercise including progression. Written material given at graduation.   Education: Resistance Exercise: - Group verbal and visual presentation on the components of exercise prescription. Introduces F.I.T.T principle from ACSM for exercise prescriptions  Reviews F.I.T.T. principles of resistance exercise including progression. Written material given at graduation.    Education: Exercise & Equipment Safety: - Individual verbal instruction and demonstration of equipment use and safety with use of the equipment. Flowsheet Row Cardiac Rehab  from 01/14/2022 in [facility] Cardiac and Pulmonary Rehab  Date 01/05/22  Educator [facility]  Instruction Review Code 1- Verbalizes Understanding       Education: Exercise Physiology & General Exercise Guidelines: - Group verbal and written instruction with models to review the exercise physiology of the cardiovascular system and associated critical values. Provides general exercise guidelines with specific guidelines to those with heart or lung disease.  Flowsheet Row Cardiac Rehab from 01/14/2022 in [facility] Cardiac and Pulmonary Rehab  Education need identified 01/05/22       Education: Flexibility, Balance, Mind/Body Relaxation: - Group verbal and visual presentation with interactive activity on the components of exercise prescription. Introduces F.I.T.T principle from ACSM for exercise prescriptions. Reviews F.I.T.T. principles of flexibility and balance exercise training including progression. Also discusses the mind body connection.  Reviews various relaxation techniques to help reduce and manage stress (i.e. Deep breathing, progressive muscle relaxation, and visualization). Balance handout provided to take home. Written material given at graduation.   Activity Barriers & Risk Stratification:  Activity Barriers & Cardiac Risk Stratification - 01/05/22 1506       Activity Barriers & Cardiac Risk Stratification   Activity Barriers Left Knee Replacement;Right Knee Replacement    Cardiac Risk Stratification Moderate             6 Minute Walk:  6 Minute Walk     Row Name 01/05/22 1505         6 Minute Walk   Phase Initial     Distance 1615 feet     Walk Time 6 minutes     # of Rest Breaks 0     MPH 3.06     METS 3.52     RPE 8     Perceived Dyspnea  0     VO2 Peak 12.32     Symptoms No     Resting HR 56 bpm     Resting BP 122/84     Resting Oxygen Saturation  98 %     Exercise Oxygen Saturation  during 6 min walk 98 %     Max Ex. HR 101 bpm     Max Ex. BP 158/80     2  Minute Post BP 124/62              Oxygen Initial Assessment:   Oxygen Re-Evaluation:   Oxygen Discharge (Final Oxygen Re-Evaluation):   Initial Exercise Prescription:  Initial Exercise Prescription - 01/05/22 1500       Date of Initial Exercise RX and Referring Provider   Date 01/05/22    Referring Provider Arida      Oxygen   Maintain Oxygen Saturation 88% or higher      Treadmill   MPH 3    Grade 1    Minutes 15    METs 3.71      Recumbant  Bike   Level 3    RPM 50    Minutes 15    METs 3.52      Elliptical   Level 1    Speed 3    Minutes 15    METs 3.52      REL-XR   Level 3    Speed 50    Minutes 15    METs 3.52      Biostep-RELP   Level 3    SPM 50    Minutes 15    METs 3.52      Track   Laps 42    Minutes 15    METs 3.28      Prescription Details   Frequency (times per week) 3    Duration Progress to 30 minutes of continuous aerobic without signs/symptoms of physical distress      Intensity   THRR 40-80% of Max Heartrate 100-144    Ratings of Perceived Exertion 11-13    Perceived Dyspnea 0-4      Progression   Progression Continue to progress workloads to maintain intensity without signs/symptoms of physical distress.      Resistance Training   Training Prescription Yes    Weight 5    Reps 10-15             Perform Capillary Blood Glucose checks as needed.  Exercise Prescription Changes:   Exercise Prescription Changes     Row Name 01/05/22 1500             Response to Exercise   Blood Pressure (Admit) 122/84       Blood Pressure (Exercise) 158/80       Blood Pressure (Exit) 124/62       Heart Rate (Admit) 56 bpm       Heart Rate (Exercise) 101 bpm       Heart Rate (Exit) 55 bpm       Oxygen Saturation (Admit) 98 %       Oxygen Saturation (Exercise) 98 %       Oxygen Saturation (Exit) 98 %       Rating of Perceived Exertion (Exercise) 8       Perceived Dyspnea (Exercise) 0       Symptoms none        Comments 6 MWT results                Exercise Comments:   Exercise Comments     Row Name 01/07/22 1058           Exercise Comments First full day of exercise!  Patient was oriented to gym and equipment including functions, settings, policies, and procedures.  Patient's individual exercise prescription and treatment plan were reviewed.  All starting workloads were established based on the results of the 6 minute walk test done at initial orientation visit.  The plan for exercise progression was also introduced and progression will be customized based on patient's performance and goals.                Exercise Goals and Review:   Exercise Goals     Row Name 01/05/22 1513             Exercise Goals   Increase Physical Activity Yes       Intervention Provide advice, education, support and counseling about physical activity/exercise needs.;Develop an individualized exercise prescription for aerobic and resistive training based on initial evaluation findings, risk stratification, comorbidities and participant's personal goals.  Expected Outcomes Short Term: Attend rehab on a regular basis to increase amount of physical activity.;Long Term: Add in home exercise to make exercise part of routine and to increase amount of physical activity.;Long Term: Exercising regularly at least 3-5 days a week.       Increase Strength and Stamina Yes       Intervention Provide advice, education, support and counseling about physical activity/exercise needs.;Develop an individualized exercise prescription for aerobic and resistive training based on initial evaluation findings, risk stratification, comorbidities and participant's personal goals.       Expected Outcomes Short Term: Increase workloads from initial exercise prescription for resistance, speed, and METs.;Short Term: Perform resistance training exercises routinely during rehab and add in resistance training at home;Long Term: Improve  cardiorespiratory fitness, muscular endurance and strength as measured by increased METs and functional capacity (6MWT)       Able to understand and use rate of perceived exertion (RPE) scale Yes       Intervention Provide education and explanation on how to use RPE scale       Expected Outcomes Short Term: Able to use RPE daily in rehab to express subjective intensity level;Long Term:  Able to use RPE to guide intensity level when exercising independently       Able to understand and use Dyspnea scale Yes       Intervention Provide education and explanation on how to use Dyspnea scale       Expected Outcomes Short Term: Able to use Dyspnea scale daily in rehab to express subjective sense of shortness of breath during exertion;Long Term: Able to use Dyspnea scale to guide intensity level when exercising independently       Knowledge and understanding of Target Heart Rate Range (THRR) Yes       Intervention Provide education and explanation of THRR including how the numbers were predicted and where they are located for reference       Expected Outcomes Short Term: Able to state/look up THRR;Long Term: Able to use THRR to govern intensity when exercising independently;Short Term: Able to use daily as guideline for intensity in rehab       Able to check pulse independently Yes       Intervention Provide education and demonstration on how to check pulse in carotid and radial arteries.;Review the importance of being able to check your own pulse for safety during independent exercise       Expected Outcomes Short Term: Able to explain why pulse checking is important during independent exercise;Long Term: Able to check pulse independently and accurately       Understanding of Exercise Prescription Yes       Intervention Provide education, explanation, and written materials on patient's individual exercise prescription       Expected Outcomes Short Term: Able to explain program exercise prescription;Long  Term: Able to explain home exercise prescription to exercise independently                Exercise Goals Re-Evaluation :  Exercise Goals Re-Evaluation     Row Name 01/07/22 1058             Exercise Goal Re-Evaluation   Exercise Goals Review Able to understand and use rate of perceived exertion (RPE) scale;Able to understand and use Dyspnea scale;Knowledge and understanding of Target Heart Rate Range (THRR);Understanding of Exercise Prescription       Comments Reviewed RPE scale, THR and program prescription with pt today.  Pt voiced understanding  and was given a copy of goals to take home.       Expected Outcomes Short: Use RPE daily to regulate intensity. Long: Follow program prescription in THR.                Discharge Exercise Prescription (Final Exercise Prescription Changes):  Exercise Prescription Changes - 01/05/22 1500       Response to Exercise   Blood Pressure (Admit) 122/84    Blood Pressure (Exercise) 158/80    Blood Pressure (Exit) 124/62    Heart Rate (Admit) 56 bpm    Heart Rate (Exercise) 101 bpm    Heart Rate (Exit) 55 bpm    Oxygen Saturation (Admit) 98 %    Oxygen Saturation (Exercise) 98 %    Oxygen Saturation (Exit) 98 %    Rating of Perceived Exertion (Exercise) 8    Perceived Dyspnea (Exercise) 0    Symptoms none    Comments 6 MWT results             Nutrition:  Target Goals: Understanding of nutrition guidelines, daily intake of sodium 1500mg , cholesterol 200mg , calories 30% from fat and 7% or less from saturated fats, daily to have 5 or more servings of fruits and vegetables.  Education: All About Nutrition: -Group instruction provided by verbal, written material, interactive activities, discussions, models, and posters to present general guidelines for heart healthy nutrition including fat, fiber, MyPlate, the role of sodium in heart healthy nutrition, utilization of the nutrition label, and utilization of this knowledge for meal  planning. Follow up email sent as well. Written material given at graduation. Flowsheet Row Cardiac Rehab from 01/14/2022 in [facility] Cardiac and Pulmonary Rehab  Education need identified 01/05/22       Biometrics:  Pre Biometrics - 01/05/22 1514       Pre Biometrics   Height 5' 9.5" (1.765 m)    Weight 252 lb 9.6 oz (114.6 kg)    Waist Circumference 49.5 inches    Hip Circumference 48 inches    Waist to Hip Ratio 1.03 %    BMI (Calculated) 36.78    Single Leg Stand 30 seconds              Nutrition Therapy Plan and Nutrition Goals:  Nutrition Therapy & Goals - 01/05/22 1426       Nutrition Therapy   Diet Heart healthy, low Na    Drug/Food Interactions Statins/Certain Fruits    Protein (specify units) 85-95g    Fiber 30 grams    Whole Grain Foods 3 servings    Saturated Fats 16 max. grams    Fruits and Vegetables 8 servings/day    Sodium 2 grams      Personal Nutrition Goals   Nutrition Goal ST: review paperwork, practice MyPlate guidelines LT: follow MyPlate guidelines, limit saturated fat <16g, limit Na <2g    Comments 50 y.o. M admitted to cardiac rehab s/p NSTEMI. PMHx includes HTN, prediabetes, HLD, gout, CAD, vit D deficiency. Relevant medications includes lipitor, calcium/magnesium/zinc, vit D3. 3x/week will eat at work; he works at [employer]. They have donated many canned items higher in sodium and have been cutting back on sodium in general. they have switched to honey-wheat bread and now having oatmeal with berries. He was eating 2 meals and is now eating 3 meals per day. Discussed heart healthy eating and gout friendly eating.      Intervention Plan   Intervention Prescribe, educate and counsel regarding individualized specific dietary modifications aiming  towards targeted core components such as weight, hypertension, lipid management, diabetes, heart failure and other comorbidities.;Nutrition handout(s) given to patient.    Expected Outcomes Short Term Goal:  Understand basic principles of dietary content, such as calories, fat, sodium, cholesterol and nutrients.;Short Term Goal: A plan has been developed with personal nutrition goals set during dietitian appointment.;Long Term Goal: Adherence to prescribed nutrition plan.             Nutrition Assessments:  MEDIFICTS Score Key: ?70 Need to make dietary changes  40-70 Heart Healthy Diet ? 40 Therapeutic Level Cholesterol Diet  Flowsheet Row Cardiac Rehab from 01/05/2022 in [facility] Cardiac and Pulmonary Rehab  Picture Your Plate Total Score on Admission 45      Picture Your Plate Scores: <44 Unhealthy dietary pattern with much room for improvement. 41-50 Dietary pattern unlikely to meet recommendations for good health and room for improvement. 51-60 More healthful dietary pattern, with some room for improvement.  >60 Healthy dietary pattern, although there may be some specific behaviors that could be improved.    Nutrition Goals Re-Evaluation:   Nutrition Goals Discharge (Final Nutrition Goals Re-Evaluation):   Psychosocial: Target Goals: Acknowledge presence or absence of significant depression and/or stress, maximize coping skills, provide positive support system. Participant is able to verbalize types and ability to use techniques and skills needed for reducing stress and depression.   Education: Stress, Anxiety, and Depression - Group verbal and visual presentation to define topics covered.  Reviews how body is impacted by stress, anxiety, and depression.  Also discusses healthy ways to reduce stress and to treat/manage anxiety and depression.  Written material given at graduation.   Education: Sleep Hygiene -Provides group verbal and written instruction about how sleep can affect your health.  Define sleep hygiene, discuss sleep cycles and impact of sleep habits. Review good sleep hygiene tips.    Initial Review & Psychosocial Screening:  Initial Psych Review & Screening -  12/30/21 1307       Initial Review   Current issues with None Identified      Family Dynamics   Good Support System? Yes   wife, family     Barriers   Psychosocial barriers to participate in program There are no identifiable barriers or psychosocial needs.;The patient should benefit from training in stress management and relaxation.      Screening Interventions   Interventions Encouraged to exercise;Provide feedback about the scores to participant;To provide support and resources with identified psychosocial needs    Expected Outcomes Short Term goal: Utilizing psychosocial counselor, staff and physician to assist with identification of specific Stressors or current issues interfering with healing process. Setting desired goal for each stressor or current issue identified.;Short Term goal: Identification and review with participant of any Quality of Life or Depression concerns found by scoring the questionnaire.;Long Term Goal: Stressors or current issues are controlled or eliminated.;Long Term goal: The participant improves quality of Life and PHQ9 Scores as seen by post scores and/or verbalization of changes             Quality of Life Scores:   Quality of Life - 01/05/22 1516       Quality of Life   Select Quality of Life      Quality of Life Scores   Health/Function Pre 28 %    Socioeconomic Pre 27.19 %    Psych/Spiritual Pre 30 %    Family Pre 30 %    GLOBAL Pre 28.5 %  Scores of 19 and below usually indicate a poorer quality of life in these areas.  A difference of  2-3 points is a clinically meaningful difference.  A difference of 2-3 points in the total score of the Quality of Life Index has been associated with significant improvement in overall quality of life, self-image, physical symptoms, and general health in studies assessing change in quality of life.  PHQ-9: Review Flowsheet  More data exists      01/05/2022 12/26/2021 12/24/2020 12/20/2019  11/10/2018  Depression screen PHQ 2/9  Decreased Interest 0 0 0 0 0  Down, Depressed, Hopeless 0 0 0 0 0  PHQ - 2 Score 0 0 0 0 0  Altered sleeping 0 - - - -  Tired, decreased energy 0 - - - -  Change in appetite 0 - - - -  Feeling bad or failure about yourself  0 - - - -  Moving slowly or fidgety/restless 0 - - - -  Suicidal thoughts 0 - - - -  PHQ-9 Score 0 - - - -  Difficult doing work/chores Not difficult at all - - - -   Interpretation of Total Score  Total Score Depression Severity:  1-4 = Minimal depression, 5-9 = Mild depression, 10-14 = Moderate depression, 15-19 = Moderately severe depression, 20-27 = Severe depression   Psychosocial Evaluation and Intervention:  Psychosocial Evaluation - 12/30/21 1313       Psychosocial Evaluation & Interventions   Interventions Encouraged to exercise with the program and follow exercise prescription    Comments Gabriel Rung 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 He reports feeling well and wanting to increase his activity level. He thinks he will be out of work for 2 months. 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 He reports no stress concerns. His wife and family are very supportive. He is looking forward to learning more about the nutritional aspect of heart healthy living as that is his and his wife's main topic of discussion    Expected Outcomes Short: attend cardiac rehab for education and exercise. Long: develop and maintain positive self care habits.    Continue Psychosocial Services  Follow up required by staff             Psychosocial Re-Evaluation:   Psychosocial Discharge (Final Psychosocial Re-Evaluation):   Vocational Rehabilitation: Provide vocational rehab assistance to qualifying candidates.   Vocational Rehab Evaluation & Intervention:  Vocational Rehab - 12/30/21 1307       Initial Vocational Rehab Evaluation & Intervention   Assessment shows need for  Vocational Rehabilitation No             Education: Education Goals: Education classes will be provided on a variety of topics geared toward better understanding of heart health and risk factor modification. Participant will state understanding/return demonstration of topics presented as noted by education test scores.  Learning Barriers/Preferences:  Learning Barriers/Preferences - 12/30/21 1307       Learning Barriers/Preferences   Learning Barriers None    Learning Preferences None             General Cardiac Education Topics:  AED/CPR: - Group verbal and written instruction with the use of models to demonstrate the basic use of the AED with the basic ABC's of resuscitation.   Anatomy and Cardiac Procedures: - Group verbal and visual presentation and models provide information about basic cardiac anatomy and function. Reviews the testing  methods done to diagnose heart disease and the outcomes of the test results. Describes the treatment choices: Medical Management, Angioplasty, or Coronary Bypass Surgery for treating various heart conditions including Myocardial Infarction, Angina, Valve Disease, and Cardiac Arrhythmias.  Written material given at graduation.   Medication Safety: - Group verbal and visual instruction to review commonly prescribed medications for heart and lung disease. Reviews the medication, class of the drug, and side effects. Includes the steps to properly store meds and maintain the prescription regimen.  Written material given at graduation. Flowsheet Row Cardiac Rehab from 01/14/2022 in [facility] Cardiac and Pulmonary Rehab  Date 01/07/22  Educator SB  Instruction Review Code 1- Verbalizes Understanding       Intimacy: - Group verbal instruction through game format to discuss how heart and lung disease can affect sexual intimacy. Written material given at graduation..   Know Your Numbers and Heart Failure: - Group verbal and visual instruction to  discuss disease risk factors for cardiac and pulmonary disease and treatment options.  Reviews associated critical values for Overweight/Obesity, Hypertension, Cholesterol, and Diabetes.  Discusses basics of heart failure: signs/symptoms and treatments.  Introduces Heart Failure Zone chart for action plan for heart failure.  Written material given at graduation. Flowsheet Row Cardiac Rehab from 01/14/2022 in [facility] Cardiac and Pulmonary Rehab  Date 01/14/22  Educator SB  Instruction Review Code 1- Verbalizes Understanding       Infection Prevention: - Provides verbal and written material to individual with discussion of infection control including proper hand washing and proper equipment cleaning during exercise session. Flowsheet Row Cardiac Rehab from 01/14/2022 in [facility] Cardiac and Pulmonary Rehab  Date 01/05/22  Educator [facility] [facility]  Instruction Review Code 1- Verbalizes Understanding       Falls Prevention: - Provides verbal and written material to individual with discussion of falls prevention and safety. Flowsheet Row Cardiac Rehab from 01/14/2022 in [facility] Cardiac and Pulmonary Rehab  Date 01/05/22  Educator [facility]  Instruction Review Code 1- Verbalizes Understanding       Other: -Provides group and verbal instruction on various topics (see comments)   Knowledge Questionnaire Score:  Knowledge Questionnaire Score - 01/05/22 1517       Knowledge Questionnaire Score   Pre Score 24/26             Core Components/Risk Factors/Patient Goals at Admission:  Personal Goals and Risk Factors at Admission - 01/05/22 1517       Core Components/Risk Factors/Patient Goals on Admission    Weight Management Yes;Weight Loss    Intervention Weight Management: Develop a combined nutrition and exercise program designed to reach desired caloric intake, while maintaining appropriate intake of nutrient and fiber, sodium and fats, and appropriate energy expenditure required for the weight  goal.;Weight Management: Provide education and appropriate resources to help participant work on and attain dietary goals.;Weight Management/Obesity: Establish reasonable short term and long term weight goals.;Obesity: Provide education and appropriate resources to help participant work on and attain dietary goals.    Admit Weight 252 lb 9.6 oz (114.6 kg)    Goal Weight: Short Term 245 lb (111.1 kg)    Goal Weight: Long Term 200 lb (90.7 kg)    Expected Outcomes Short Term: Continue to assess and modify interventions until short term weight is achieved;Long Term: Adherence to nutrition and physical activity/exercise program aimed toward attainment of established weight goal;Weight Loss: Understanding of general recommendations for a balanced deficit meal plan, which promotes 1-2 lb weight loss per week and  includes a negative energy balance of [phone] kcal/d;Understanding recommendations for meals to include 15-35% energy as protein, 25-35% energy from fat, 35-60% energy from carbohydrates, less than  of dietary cholesterol, 20-35 gm of total fiber daily;Understanding of distribution of calorie intake throughout the day with the consumption of 4-5 meals/snacks    Hypertension Yes    Intervention Provide education on lifestyle modifcations including regular physical activity/exercise, weight management, moderate sodium restriction and increased consumption of fresh fruit, vegetables, and low fat dairy, alcohol moderation, and smoking cessation.;Monitor prescription use compliance.    Expected Outcomes Short Term: Continued assessment and intervention until BP is < 140/79mm HG in hypertensive participants. < 130/48mm HG in hypertensive participants with diabetes, heart failure or chronic kidney disease.;Long Term: Maintenance of blood pressure at goal levels.    Lipids Yes    Intervention Provide education and support for participant on nutrition & aerobic/resistive exercise along with prescribed  medications to achieve LDL 70mg , HDL >40mg .    Expected Outcomes Short Term: Participant states understanding of desired cholesterol values and is compliant with medications prescribed. Participant is following exercise prescription and nutrition guidelines.;Long Term: Cholesterol controlled with medications as prescribed, with individualized exercise RX and with personalized nutrition plan. Value goals: LDL < , HDL > 40 mg.             Education:Diabetes - Individual verbal and written instruction to review signs/symptoms of diabetes, desired ranges of glucose level fasting, after meals and with exercise. Acknowledge that pre and post exercise glucose checks will be done for 3 sessions at entry of program.   Core Components/Risk Factors/Patient Goals Review:    Core Components/Risk Factors/Patient Goals at Discharge (Final Review):    ITP Comments:  ITP Comments     Row Name 12/30/21 1320 01/05/22 1505 01/07/22 1058 01/21/22 1010     ITP Comments Initial telephone orientation completed. Diagnosis can be found in [facility] 11/22. EP orientation scheduled for Monday 12/11 at 1:30. Completed and gym orientation. Initial ITP created and sent for review to Dr. Bethann Punches, Medical Director. First full day of exercise!  Patient was oriented to gym and equipment including functions, settings, policies, and procedures.  Patient's individual exercise prescription and treatment plan were reviewed.  All starting workloads were established based on the results of the 6 minute walk test done at initial orientation visit.  The plan for exercise progression was also introduced and progression will be customized based on patient's performance and goals. 30 Day review completed. Medical Director ITP review done, changes made as directed, and signed approval by Medical Director.     new to program             Comments:

## 2022-01-23 ENCOUNTER — Encounter: Payer: BC Managed Care – PPO | Admitting: *Deleted

## 2022-01-23 DIAGNOSIS — Z48812 Encounter for surgical aftercare following surgery on the circulatory system: Secondary | ICD-10-CM | POA: Diagnosis not present

## 2022-01-23 DIAGNOSIS — Z955 Presence of coronary angioplasty implant and graft: Secondary | ICD-10-CM | POA: Diagnosis not present

## 2022-01-23 DIAGNOSIS — I252 Old myocardial infarction: Secondary | ICD-10-CM | POA: Diagnosis not present

## 2022-01-23 DIAGNOSIS — I214 Non-ST elevation (NSTEMI) myocardial infarction: Secondary | ICD-10-CM

## 2022-01-23 NOTE — Progress Notes (Signed)
Cardiology Office Note    Date:  01/27/2022   ID:  Eric, Huff 09-07-1970, MRN [id number]  PCP:  Eustaquio Boyden, MD  Cardiologist:  [name], MD  Electrophysiologist:  None   Chief Complaint: Follow-up  History of Present Illness:   Eric Huff is a 51 y.o. male with history of CAD with NSTEMI status post PCI to the LCx in 11/2021, HTN, tobacco use, gout, and obesity who presents for follow-up of CAD.   Prior to his admission in 11/2021, he had no previously known cardiac history.  He was admitted to the hospital from 11/22 through 12/19/2021 with an NSTEMI.  High-sensitivity troponin trended to 5865.  Echo showed an EF of 55 to 60%, no regional wall motion abnormalities, normal LV diastolic function parameters, normal RV systolic function and ventricular cavity size, trivial mitral regurgitation, and an estimated right atrial pressure of 8 mmHg.  LHC showed severe two-vessel CAD with chronically occluded RCA with well-developed left-to-right collaterals as well as severe stenosis affecting the LCx which was felt to be the likely culprit for his NSTEMI.  The LAD had mild to moderate nonobstructive disease.  High normal LVEDP.  He underwent successful PCI/DES to the mid to distal LCx as well as proximal LCx.  The stents were not overlapped.  He was doing well from a cardiac perspective and hospital follow-up, without angina or decompensation.  He was adherent and tolerating cardiac medications.  He had quit smoking cigars.  Since we last saw him, he has been participating in cardiac rehab.  He comes in accompanied by his wife today and is doing well from a cardiac perspective.  He is without symptoms concerning for ongoing angina or decompensation.  Following his last visit, he did have some fleeting left shoulder discomfort, though these symptoms have subsequently resolved.  He is participating in cardiac rehab without cardiac limitation.  No dizziness, presyncope, or  syncope.  No falls, hematochezia, or melena.  Tolerating cardiac medications without issues.  He has not missed any doses of DAPT.  He does continue to note gout involving the right wrist.  He has since been started on allopurinol.   Labs independently reviewed: 12/2021 - Hgb 15.4, PLT 344, potassium 4.4, BUN 20, serum creatinine 1.03 11/2021 - magnesium 1.8, LP(a) 113, TC 208, TG 181, HDL 34, LDL 138, albumin 4.2, AST/ALT normal, A1c 5.9   Past Medical History:  Diagnosis Date   Gout    Hypertension 12/26/2005   Morbid obesity (HCC)    Plantar fascia rupture 08/29/2010   Tobacco use    a. Smokes 1 cigar daily.    Past Surgical History:  Procedure Laterality Date   CORONARY STENT INTERVENTION N/A 12/19/2021   Procedure: CORONARY STENT INTERVENTION;  Surgeon: Iran Ouch, MD;  Location: [facility] INVASIVE CV LAB;  Service: Cardiovascular;  Laterality: N/A;   KNEE ARTHROSCOPY     Left and right acl    LEFT HEART CATH AND CORONARY ANGIOGRAPHY N/A 12/19/2021   Procedure: LEFT HEART CATH AND CORONARY ANGIOGRAPHY;  Surgeon: Iran Ouch, MD;  Location: [facility] INVASIVE CV LAB;  Service: Cardiovascular;  Laterality: N/A;   ORIF ELBOW FRACTURE     left   ORIF FEMUR FRACTURE     left   REPLACEMENT TOTAL KNEE     left   SHOULDER SURGERY  2003    Current Medications: Current Meds  Medication Sig   allopurinol (ZYLOPRIM) 100 MG tablet Take 1 tablet (100 mg total)  by mouth daily.   aspirin EC 81 MG tablet Take 1 tablet (81 mg total) by mouth daily. Swallow whole.   atorvastatin (LIPITOR) 80 MG tablet Take 1 tablet (80 mg total) by mouth daily.   CALCIUM-MAGNESIUM-ZINC PO Take 3 tablets by mouth daily.   carvedilol (COREG) 3.125 MG tablet Take 1 tablet (3.125 mg total) by mouth 2 (two) times daily with a meal.   Cholecalciferol (VITAMIN D3) 125 MCG (5000 UT) CAPS Take 1 capsule by mouth daily.   colchicine 0.6 MG tablet Take 1 tablet (0.6 mg total) by mouth daily as needed. For gout  flares, may take 2 tablets on the first day of flare   loratadine (CLARITIN) 10 MG tablet Take 10 mg by mouth daily.   losartan (COZAAR) 25 MG tablet Take 1 tablet (25 mg total) by mouth daily.   prasugrel (EFFIENT) 10 MG TABS tablet Take 1 tablet (10 mg total) by mouth daily.   [DISCONTINUED] lisinopril (ZESTRIL) 10 MG tablet Take 1 tablet (10 mg total) by mouth daily.    Allergies:   Patient has no known allergies.   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  Quit cigars in November 2023  Substance and Sexual Activity   Alcohol use: No    Alcohol/week: 0.0 standard drinks of alcohol   Drug use: No   Sexual activity: Not on file  Other Topics Concern   Not on file  Social History Narrative   "Eric Huff"   Lives with wife, youngest daughter, 2 dogs, bearded dragon   Occupation: Works as a Transport planner, second shift   Activity: no regular exercise, some yardwork    Diet: good water, fruits/vegetables daily, smaller portions more frequency   Social Determinants of Corporate investment banker Strain: Not on file  Food Insecurity: No Food Insecurity (12/17/2021)   Hunger Vital Sign    Worried About Running Out of Food in the Last Year: Never true    Ran Out of Food in the Last Year: Never true  Transportation Needs: No Transportation Needs (12/17/2021)   PRAPARE - Administrator, Civil Service (Medical): No    Lack of Transportation (Non-Medical): No  Physical Activity: Not on file  Stress: Not on file  Social Connections: Not on file     Family History:  The  patient's family history includes CAD in his paternal grandfather; CAD (age of onset: 18) in his maternal grandfather; Cancer in his maternal grandmother; Cancer (age of onset: 5) in an other family member; Diabetes in his paternal grandmother; Hypertension in his cousin, father, and paternal grandmother.  ROS:   12-point review of systems is negative unless otherwise noted in the HPI.   EKGs/Labs/Other Studies Reviewed:    Studies reviewed were summarized above. The additional studies were reviewed today:  LHC 12/19/2021:   Prox RCA to Mid RCA lesion is 100% stenosed.   Mid LAD-1 lesion is 30% stenosed.   Mid LAD-2 lesion is 30% stenosed.  Mid Cx to Dist Cx lesion is 80% stenosed.   Prox Cx to Mid Cx lesion is 95% stenosed.   3rd Mrg lesion is 30% stenosed.   Mid Cx lesion is 30% stenosed.   A drug-eluting stent was successfully placed using a STENT ONYX FRONTIER 2.5X22.   A drug-eluting stent was successfully placed using a STENT ONYX FRONTIER 3.0X15.   Post intervention, there is a 0% residual stenosis.   Post intervention, there is a 0% residual stenosis.   1.  Severe two-vessel coronary artery disease with chronically occluded right coronary artery with well-developed left-to-right collaterals and severe stenosis affecting the left circumflex which is the likely culprit for non-ST elevation myocardial infarction.  The LAD itself has mild to moderate nonobstructive disease. 2.  Left ventricular angiography was not performed.  EF was normal by echo.  High normal left ventricular end-diastolic pressure 3.  Successful angioplasty and drug-eluting stent placement to the mid/distal left circumflex as well as proximal left circumflex.  The stents were not overlapped.   Recommendations: Dual antiplatelet therapy for 12 months. Aggressive medical therapy. Possible discharge home in the afternoon or tomorrow morning. __________   2D echo 12/18/2021: 1. Left ventricular ejection  fraction, by estimation, is 55 to 60%. The  left ventricle has normal function. The left ventricle has no regional  wall motion abnormalities. Left ventricular diastolic parameters were  normal.   2. Right ventricular systolic function is normal. The right ventricular  size is normal.   3. The mitral valve is normal in structure. Trivial mitral valve  regurgitation. No evidence of mitral stenosis.   4. The aortic valve is tricuspid. Aortic valve regurgitation is not  visualized. No aortic stenosis is present.   5. The inferior vena cava is dilated in size with >50% respiratory  variability, suggesting right atrial pressure of 8 mmHg.   Comparison(s): No prior Echocardiogram.    EKG:  EKG is not ordered today.    Recent Labs: 12/16/2021: ALT 35 12/19/2021: Magnesium 1.8 12/26/2021: BUN 20; Creatinine, Ser 1.03; Hemoglobin 15.4; Platelets 344; Potassium 4.4; Sodium 135  Recent Lipid Panel    Component Value Date/Time   CHOL 208 (H) 12/18/2021 0114   TRIG 181 (H) 12/18/2021 0114   HDL 34 (L) 12/18/2021 0114   CHOLHDL 6.1 12/18/2021 0114   VLDL 36 12/18/2021 0114   LDLCALC 138 (H) 12/18/2021 0114   LDLDIRECT 114.0 12/16/2021 0946    PHYSICAL EXAM:    VS:  BP 118/80 (BP Location: Left Arm, Patient Position: Sitting, Cuff Size: Normal)   Pulse 69   Ht 5\' 9"  (1.753 m)   Wt 251 lb 9.6 oz (114.1 kg)   SpO2 96%   BMI 37.15 kg/m   BMI: Body mass index is 37.15 kg/m.  Physical Exam Vitals reviewed.  Constitutional:      Appearance: He is well-developed.  HENT:     Head: Normocephalic and atraumatic.  Eyes:     General:        Right eye: No discharge.        Left eye: No discharge.  Neck:     Vascular: No JVD.  Cardiovascular:     Rate and Rhythm: Normal rate and regular rhythm.     Pulses:          Radial pulses are 2+ on the right side and 2+ on the left side.     Heart sounds: Normal heart sounds, S1 normal and S2 normal. Heart sounds not distant. No  midsystolic  click and no opening snap. No murmur heard.    No friction rub.  Pulmonary:     Effort: Pulmonary effort is normal. No respiratory distress.     Breath sounds: Normal breath sounds. No decreased breath sounds, wheezing or rales.  Chest:     Chest wall: No tenderness.  Abdominal:     General: There is no distension.  Musculoskeletal:     Cervical back: Normal range of motion.  Skin:    General: Skin is warm and dry.     Nails: There is no clubbing.  Neurological:     Mental Status: He is alert and oriented to person, place, and time.  Psychiatric:        Speech: Speech normal.        Behavior: Behavior normal.        Thought Content: Thought content normal.        Judgment: Judgment normal.     Wt Readings from Last 3 Encounters:  01/27/22 251 lb 9.6 oz (114.1 kg)  01/05/22 252 lb 9.6 oz (114.6 kg)  12/26/21 244 lb 3.2 oz (110.8 kg)     ASSESSMENT & PLAN:   CAD involving the native coronary arteries with NSTEMI without angina: He continues to do well and is without symptoms of angina or decompensation.  Status post PCI/DES (nonoverlapping) to the proximal and mid/distal LCx.  He has a CTO of the RCA with well-developed left-to-right collaterals as well as mild to moderate nonobstructive LAD disease.  Continue DAPT with ASA and prasugrel without interruption for minimum of 12 months dating back to date of PCI (12/19/2021).  Continue aggressive risk factor modification and secondary prevention including carvedilol and losartan in place of lisinopril.  He has participated with cardiac rehab.  No indication for further ischemic testing at this time outside of planned treadmill stress test at the end of this month as part of DOT regulations.  HTN: Blood pressure is well-controlled in the office today.  Blood pressures at home are largely in the 1 teens to 130s systolic.  We will transition him from lisinopril to losartan in an effort to improve gout burden.  Otherwise, he remains on  carvedilol.  HLD: LDL 138 in 11/2020 with goal being less than 55.  Tolerating atorvastatin 80 mg daily.  Check lipid panel and LFT at next visit.  CDL: Remains out of work for a minimum of 2 months dating back to date of his NSTEMI.  Scheduled for treadmill on 02/24/2022.  EF normal by echo.  Adherent and tolerating cardiac medications.  Cigar use: Continues to abstain.   Disposition: F/u with Dr. Kirke Corin or an APP 02/25/2022.   Medication Adjustments/Labs and Tests Ordered: Current medicines are reviewed at length with the patient today.  Concerns regarding medicines are outlined above. Medication changes, Labs and Tests ordered today are summarized above and listed in the Patient Instructions accessible in Encounters.   Signed, Eula Listen, PA-C 01/27/2022 9:02 AM     [facility] [place] [facility] [address] [place], [place] [address] [phone]

## 2022-01-23 NOTE — Progress Notes (Signed)
Daily Session Note  Patient Details  Name: Eric Huff MRN: [id number] Date of Birth: [birth year] Referring Provider:   Flowsheet Row Cardiac Rehab from 01/05/2022 in [facility] Cardiac and Pulmonary Rehab  Referring Provider Fletcher Anon       Encounter Date: 01/23/2022  Check In:  Session Check In - 01/23/22 1030       Check-In   Supervising physician immediately available to respond to emergencies See telemetry face sheet for immediately available ER MD    Location [facility]-Cardiac & Pulmonary Rehab    Staff Present Heath Lark, RN, BSN, CCRP;Jessica [place], MA, RCEP, CCRP, CCET;Kedrick Sabin, [place]    Virtual Visit No    Medication changes reported     No    Fall or balance concerns reported    No    Warm-up and Cool-down Performed on first and last piece of equipment    Resistance Training Performed Yes    VAD Patient? No    PAD/SET Patient? No      Pain Assessment   Currently in Pain? No/denies                Social History   Tobacco Use  Smoking Status Former   Types: Cigarettes, Cigars   Quit date: 12/19/2021   Years since quitting: 0.0  Smokeless Tobacco Former  Tobacco Comments   Quit cigarettes in 2009.  Quit cigars in November 2023    Goals Met:  Independence with exercise equipment Exercise tolerated well No report of concerns or symptoms today  Goals Unmet:  Not Applicable  Comments: Pt able to follow exercise prescription today without complaint.  Will continue to monitor for progression.    Dr. Emily Filbert is Medical Director for [place].  Dr. Ottie Glazier is Medical Director for [facility] Pulmonary Rehabilitation.

## 2022-01-27 ENCOUNTER — Ambulatory Visit: Payer: BC Managed Care – PPO | Attending: Physician Assistant | Admitting: Physician Assistant

## 2022-01-27 ENCOUNTER — Encounter: Payer: Self-pay | Admitting: Physician Assistant

## 2022-01-27 VITALS — BP 118/80 | HR 69 | Ht 69.0 in | Wt 251.6 lb

## 2022-01-27 DIAGNOSIS — E785 Hyperlipidemia, unspecified: Secondary | ICD-10-CM

## 2022-01-27 DIAGNOSIS — I2510 Atherosclerotic heart disease of native coronary artery without angina pectoris: Secondary | ICD-10-CM | POA: Diagnosis not present

## 2022-01-27 DIAGNOSIS — I214 Non-ST elevation (NSTEMI) myocardial infarction: Secondary | ICD-10-CM

## 2022-01-27 DIAGNOSIS — Z87891 Personal history of nicotine dependence: Secondary | ICD-10-CM | POA: Diagnosis not present

## 2022-01-27 MED ORDER — LOSARTAN POTASSIUM 25 MG PO TABS: 25.0000 mg | ORAL_TABLET | Freq: Every day | ORAL | 0 refills | Status: DC

## 2022-01-27 NOTE — Patient Instructions (Signed)
Medication Instructions:  STOP lisinopril START losartan 25 mg by mouth daily  *If you need a refill on your cardiac medications before your next appointment, please call your pharmacy*   Lab Work: None ordered  If you have labs (blood work) drawn today and your tests are completely normal, you will receive your results only by: [place] (if you have MyChart) OR A paper copy in the mail If you have any lab test that is abnormal or we need to change your treatment, we will call you to review the results.   Testing/Procedures: None ordered  Follow-Up: At [facility], you and your health needs are our priority.  As part of our continuing mission to provide you with exceptional heart care, we have created designated Provider Care Teams.  These Care Teams include your primary Cardiologist (physician) and Advanced Practice Providers (APPs -  Physician Assistants and Nurse Practitioners) who all work together to provide you with the care you need, when you need it.  We recommend signing up for the patient portal called "MyChart".  Sign up information is provided on this After Visit Summary.  MyChart is used to connect with patients for Virtual Visits (Telemedicine).  Patients are able to view lab/test results, encounter notes, upcoming appointments, etc.  Non-urgent messages can be sent to your provider as well.   To learn more about what you can do with MyChart, go to [email or web address].    Your next appointment:    February 25, 2022 at 2:50PM   The format for your next appointment:   In Person  Provider:   Christell Faith, PA-C    Important Information About Sugar

## 2022-01-28 ENCOUNTER — Encounter: Payer: BC Managed Care – PPO | Admitting: *Deleted

## 2022-02-02 ENCOUNTER — Encounter: Payer: BC Managed Care – PPO | Attending: Cardiovascular Disease | Admitting: *Deleted

## 2022-02-02 DIAGNOSIS — I214 Non-ST elevation (NSTEMI) myocardial infarction: Secondary | ICD-10-CM

## 2022-02-02 DIAGNOSIS — Z955 Presence of coronary angioplasty implant and graft: Secondary | ICD-10-CM | POA: Diagnosis not present

## 2022-02-02 NOTE — Progress Notes (Deleted)
Daily Session Note  Patient Details  Name: Eric Huff MRN: [id number] Date of Birth: Sep 28, 1970 Referring Provider:   Flowsheet Row Cardiac Rehab from 01/05/2022 in [facility] Cardiac and Pulmonary Rehab  Referring Provider Fletcher Anon       Encounter Date: 02/02/2022  Check In:  Session Check In - 02/02/22 1009       Check-In   Supervising physician immediately available to respond to emergencies See telemetry face sheet for immediately available ER MD    Location [facility]-Cardiac & Pulmonary Rehab    Staff Present Darlyne Russian, RN, Doyce Para, BS, ACSM CEP, Exercise Physiologist;Noah Tickle, BS, Exercise Physiologist    Virtual Visit No    Medication changes reported     No    Fall or balance concerns reported    No    Warm-up and Cool-down Performed on first and last piece of equipment    Resistance Training Performed Yes    VAD Patient? No    PAD/SET Patient? No      Pain Assessment   Currently in Pain? No/denies                Social History   Tobacco Use  Smoking Status Former   Types: Cigarettes, Cigars   Quit date: 12/19/2021   Years since quitting: 0.1  Smokeless Tobacco Former  Tobacco Comments   Quit cigarettes in 2009.  Quit cigars in November 2023    Goals Met:  Independence with exercise equipment Exercise tolerated well No report of concerns or symptoms today Strength training completed today  Goals Unmet:  Not Applicable  Comments: Pt able to follow exercise prescription today without complaint.  Will continue to monitor for progression.    Dr. Emily Filbert is Medical Director for [facility].  Dr. Ottie Glazier is Medical Director for [facility] Pulmonary Rehabilitation.

## 2022-02-02 NOTE — Progress Notes (Signed)
Daily Session Note  Patient Details  Name: Eric Huff MRN: [id number] Date of Birth: October 31, 1970 Referring Provider:   Flowsheet Row Cardiac Rehab from 01/05/2022 in [facility] Cardiac and Pulmonary Rehab  Referring Provider Fletcher Anon       Encounter Date: 02/02/2022  Check In:  Session Check In - 02/02/22 1009       Check-In   Supervising physician immediately available to respond to emergencies See telemetry face sheet for immediately available ER MD    Location [facility]-Cardiac & Pulmonary Rehab    Staff Present Darlyne Russian, RN, Doyce Para, BS, ACSM CEP, Exercise Physiologist;Noah Tickle, BS, Exercise Physiologist    Virtual Visit No    Medication changes reported     Yes    Comments started Losartan    Fall or balance concerns reported    No    Warm-up and Cool-down Performed on first and last piece of equipment    Resistance Training Performed Yes    VAD Patient? No    PAD/SET Patient? No      Pain Assessment   Currently in Pain? No/denies                Social History   Tobacco Use  Smoking Status Former   Types: Cigarettes, Cigars   Quit date: 12/19/2021   Years since quitting: 0.1  Smokeless Tobacco Former  Tobacco Comments   Quit cigarettes in 2009.  Quit cigars in November 2023    Goals Met:  Independence with exercise equipment Exercise tolerated well No report of concerns or symptoms today Strength training completed today  Goals Unmet:  Not Applicable  Comments: Pt able to follow exercise prescription today without complaint.  Will continue to monitor for progression.    Dr. Emily Filbert is Medical Director for [place].  Dr. Ottie Glazier is Medical Director for [facility] Pulmonary Rehabilitation.

## 2022-02-04 ENCOUNTER — Encounter: Payer: BC Managed Care – PPO | Admitting: *Deleted

## 2022-02-04 DIAGNOSIS — I214 Non-ST elevation (NSTEMI) myocardial infarction: Secondary | ICD-10-CM | POA: Diagnosis not present

## 2022-02-04 DIAGNOSIS — Z955 Presence of coronary angioplasty implant and graft: Secondary | ICD-10-CM | POA: Diagnosis not present

## 2022-02-04 NOTE — Progress Notes (Signed)
Daily Session Note  Patient Details  Name: OLIVERIO CHO MRN: [id number] Date of Birth: 1970-03-06 Referring Provider:   Flowsheet Row Cardiac Rehab from 01/05/2022 in [facility] Cardiac and Pulmonary Rehab  Referring Provider Fletcher Anon       Encounter Date: 02/04/2022  Check In:  Session Check In - 02/04/22 0800       Check-In   Supervising physician immediately available to respond to emergencies See telemetry face sheet for immediately available ER MD    Location [facility]-Cardiac & Pulmonary Rehab    Staff Present Darlyne Russian, RN, ADN;[place] Tessie Fass, RCP,RRT,BSRT;Noah Tickle, BS, Exercise Physiologist    Virtual Visit No    Medication changes reported     No    Fall or balance concerns reported    No    Warm-up and Cool-down Performed on first and last piece of equipment    Resistance Training Performed Yes    VAD Patient? No    PAD/SET Patient? No      Pain Assessment   Currently in Pain? No/denies               Exercise Prescription Changes - 02/04/22 0700       Home Exercise Plan   Plans to continue exercise at Home (comment)   walking, gym at work   Frequency Add 2 additional days to program exercise sessions.    Initial Home Exercises Provided 02/04/22             Social History   Tobacco Use  Smoking Status Former   Types: Cigarettes, Cigars   Quit date: 12/19/2021   Years since quitting: 0.1  Smokeless Tobacco Former  Tobacco Comments   Quit cigarettes in 2009.  Quit cigars in November 2023    Goals Met:  Independence with exercise equipment Exercise tolerated well No report of concerns or symptoms today Strength training completed today  Goals Unmet:  Not Applicable  Comments: Pt able to follow exercise prescription today without complaint.  Will continue to monitor for progression.    Dr. Emily Filbert is Medical Director for [place].  Dr. Ottie Glazier is Medical Director for [facility] Pulmonary Rehabilitation.

## 2022-02-09 ENCOUNTER — Telehealth: Payer: Self-pay | Admitting: Cardiovascular Disease

## 2022-02-09 ENCOUNTER — Encounter: Payer: BC Managed Care – PPO | Admitting: *Deleted

## 2022-02-09 DIAGNOSIS — I214 Non-ST elevation (NSTEMI) myocardial infarction: Secondary | ICD-10-CM

## 2022-02-09 DIAGNOSIS — Z955 Presence of coronary angioplasty implant and graft: Secondary | ICD-10-CM

## 2022-02-09 NOTE — Progress Notes (Signed)
Daily Session Note  Patient Details  Name: Eric Huff MRN: [id number] Date of Birth: 04-Jun-1970 Referring Provider:   Flowsheet Row Cardiac Rehab from 01/05/2022 in [facility] Cardiac and Pulmonary Rehab  Referring Provider Fletcher Anon       Encounter Date: 02/09/2022  Check In:  Session Check In - 02/09/22 1003       Check-In   Supervising physician immediately available to respond to emergencies See telemetry face sheet for immediately available ER MD    Location [facility]-Cardiac & Pulmonary Rehab    Staff Present Darlyne Russian, RN, Doyce Para, BS, ACSM CEP, Exercise Physiologist;Noah Tickle, BS, Exercise Physiologist    Virtual Visit No    Medication changes reported     No    Fall or balance concerns reported    No    Warm-up and Cool-down Performed on first and last piece of equipment    Resistance Training Performed Yes    VAD Patient? No    PAD/SET Patient? No      Pain Assessment   Currently in Pain? No/denies                Social History   Tobacco Use  Smoking Status Former   Types: Cigarettes, Cigars   Quit date: 12/19/2021   Years since quitting: 0.1  Smokeless Tobacco Former  Tobacco Comments   Quit cigarettes in 2009.  Quit cigars in November 2023    Goals Met:  Independence with exercise equipment Exercise tolerated well No report of concerns or symptoms today Strength training completed today  Goals Unmet:  Not Applicable  Comments: Pt able to follow exercise prescription today without complaint.  Will continue to monitor for progression.    Dr. Emily Filbert is Medical Director for [place].  Dr. Ottie Glazier is Medical Director for [facility] Pulmonary Rehabilitation.

## 2022-02-09 NOTE — Telephone Encounter (Signed)
Called and spoke with the patient. He stated that since his cath in November he has had three episodes of right wrist swelling. The episodes last a few days where the right wrist is warm to the touch, painful and red. It will last 2-3 days and then get better. He stated that this did not happen prior to the cath. He does not feel like it is gout.   He has been advised that this should not be related to the cath but he would like Dr. Tyrell Antonio recommendations. He is concerned for an infection. He denies a fever.

## 2022-02-09 NOTE — Telephone Encounter (Signed)
Patient called stating he had stents put in back in November, and this is the third time his right wrist is swelling.  He states he can't make a first. It's at the site were they went in.

## 2022-02-10 ENCOUNTER — Encounter: Payer: Self-pay | Admitting: Internal Medicine

## 2022-02-10 ENCOUNTER — Ambulatory Visit: Payer: BC Managed Care – PPO | Admitting: Internal Medicine

## 2022-02-10 VITALS — BP 118/76 | HR 68 | Temp 97.7°F | Ht 69.0 in | Wt 254.0 lb

## 2022-02-10 DIAGNOSIS — R059 Cough, unspecified: Secondary | ICD-10-CM | POA: Diagnosis not present

## 2022-02-10 NOTE — Progress Notes (Signed)
Patient  was scheduled for an office visit  by [facility] but did not stay for the office visit .  He wanted only an RSV test. He will be charged a no show fee.

## 2022-02-11 ENCOUNTER — Encounter: Payer: BC Managed Care – PPO | Admitting: *Deleted

## 2022-02-11 DIAGNOSIS — Z955 Presence of coronary angioplasty implant and graft: Secondary | ICD-10-CM

## 2022-02-11 DIAGNOSIS — I214 Non-ST elevation (NSTEMI) myocardial infarction: Secondary | ICD-10-CM | POA: Diagnosis not present

## 2022-02-11 NOTE — Progress Notes (Signed)
Daily Session Note  Patient Details  Name: Eric Huff MRN: [id number] Date of Birth: 04-19-1970 Referring Provider:   Flowsheet Row Cardiac Rehab from 01/05/2022 in [facility] Cardiac and Pulmonary Rehab  Referring Provider Fletcher Anon       Encounter Date: 02/11/2022  Check In:  Session Check In - 02/11/22 1004       Check-In   Supervising physician immediately available to respond to emergencies See telemetry face sheet for immediately available ER MD    Location [facility]-Cardiac & Pulmonary Rehab    Staff Present Darlyne Russian, RN, Dimple Nanas, BS, Exercise Physiologist;[place]att Tessie Fass, [place]    Virtual Visit No    Medication changes reported     No    Fall or balance concerns reported    No    Warm-up and Cool-down Performed on first and last piece of equipment    Resistance Training Performed Yes    VAD Patient? No    PAD/SET Patient? No      Pain Assessment   Currently in Pain? No/denies                Social History   Tobacco Use  Smoking Status Former   Types: Cigarettes, Cigars   Quit date: 12/19/2021   Years since quitting: 0.1  Smokeless Tobacco Former  Tobacco Comments   Quit cigarettes in 2009.  Quit cigars in November 2023    Goals Met:  Independence with exercise equipment Exercise tolerated well No report of concerns or symptoms today Strength training completed today  Goals Unmet:  Not Applicable  Comments: Pt able to follow exercise prescription today without complaint.  Will continue to monitor for progression.    Dr. Emily Filbert is Medical Director for [place].  Dr. Ottie Glazier is Medical Director for [facility] Pulmonary Rehabilitation.

## 2022-02-11 NOTE — Telephone Encounter (Signed)
Patient came by office to check on status  States that he is on Short Term disability and would like to know something soon

## 2022-02-11 NOTE — Telephone Encounter (Signed)
Add him to my schedule tomorrow at 10:20 AM so that I will be able to determine what is going on with them.

## 2022-02-11 NOTE — Telephone Encounter (Signed)
Pt made aware of MD's recommendations. Pt verbalized understanding.  Appointment scheduled for 1/20 at 10:20 am as requested by MD

## 2022-02-12 ENCOUNTER — Ambulatory Visit: Payer: BC Managed Care – PPO | Attending: Cardiovascular Disease | Admitting: Cardiovascular Disease

## 2022-02-12 ENCOUNTER — Encounter: Payer: Self-pay | Admitting: Cardiovascular Disease

## 2022-02-12 VITALS — BP 110/78 | HR 72 | Ht 69.0 in | Wt 252.1 lb

## 2022-02-12 DIAGNOSIS — M729 Fibroblastic disorder, unspecified: Secondary | ICD-10-CM | POA: Diagnosis not present

## 2022-02-12 DIAGNOSIS — I10 Essential (primary) hypertension: Secondary | ICD-10-CM

## 2022-02-12 DIAGNOSIS — I2510 Atherosclerotic heart disease of native coronary artery without angina pectoris: Secondary | ICD-10-CM

## 2022-02-12 DIAGNOSIS — E785 Hyperlipidemia, unspecified: Secondary | ICD-10-CM

## 2022-02-12 DIAGNOSIS — M25531 Pain in right wrist: Secondary | ICD-10-CM

## 2022-02-12 LAB — RESPIRATORY VIRUS PANEL

## 2022-02-12 MED ORDER — METHYLPREDNISOLONE 4 MG PO TBPK: ORAL_TABLET | ORAL | 0 refills | Status: DC

## 2022-02-12 MED ORDER — COLCHICINE 0.6 MG PO TABS: ORAL_TABLET | ORAL | 0 refills | Status: DC

## 2022-02-12 MED ORDER — CARVEDILOL 3.125 MG PO TABS: 3.1250 mg | ORAL_TABLET | Freq: Two times a day (BID) | ORAL | 3 refills | Status: DC

## 2022-02-12 MED ORDER — LOSARTAN POTASSIUM 25 MG PO TABS: 25.0000 mg | ORAL_TABLET | Freq: Every day | ORAL | 3 refills | Status: DC

## 2022-02-12 MED ORDER — ATORVASTATIN CALCIUM 80 MG PO TABS: 80.0000 mg | ORAL_TABLET | Freq: Every day | ORAL | 3 refills | Status: DC

## 2022-02-12 NOTE — Patient Instructions (Addendum)
Medication Instructions:  COLCHICINE: Take one 0.6 mg tablet twice daily for 7 days, then take one tablet daily  MEDROL: Take 6 of the 4 mg tablets the first day, 5 tablets the next day, 4 tablets the next, 3 tablets the next, 2 tablets the next and then take the last tablet for a total of 6 days. *If you need a refill on your cardiac medications before your next appointment, please call your pharmacy*   Lab Work: None ordered If you have labs (blood work) drawn today and your tests are completely normal, you will receive your results only by: [place] (if you have MyChart) OR A paper copy in the mail If you have any lab test that is abnormal or we need to change your treatment, we will call you to review the results.   Testing/Procedures: None ordered   Follow-Up: At [facility], you and your health needs are our priority.  As part of our continuing mission to provide you with exceptional heart care, we have created designated Provider Care Teams.  These Care Teams include your primary Cardiologist (physician) and Advanced Practice Providers (APPs -  Physician Assistants and Nurse Practitioners) who all work together to provide you with the care you need, when you need it.  We recommend signing up for the patient portal called "MyChart".  Sign up information is provided on this After Visit Summary.  MyChart is used to connect with patients for Virtual Visits (Telemedicine).  Patients are able to view lab/test results, encounter notes, upcoming appointments, etc.  Non-urgent messages can be sent to your provider as well.   To learn more about what you can do with MyChart, go to [email or web address].    Your next appointment:   Keep follow up as planned

## 2022-02-12 NOTE — Progress Notes (Signed)
Cardiology Office Note   Date:  02/12/2022   ID:  Eric, Huff 16-Jul-1970, MRN [id number]  PCP:  Ria Bush, MD  Cardiologist:   Kathlyn Sacramento, MD   Chief Complaint  Patient presents with   Other    Wrist swelling. Meds reviewed verbally with pt.      History of Present Illness: Eric Huff is a 52 y.o. male who presents for a follow-up visit regarding coronary artery disease and to recheck right wrest. He has past medical history of essential hypertension, tobacco use, gout and obesity. He presented in November 2023 with non-ST elevation myocardial infarction.  Echocardiogram showed normal LV systolic function. Cardiac catheterization was done by me via the right radial artery which showed chronically occluded right coronary artery with well-developed left-to-right collaterals and severe stenosis affecting the left circumflex.  The LAD had mild to moderate nonobstructive disease.  I performed successful PCI and drug-eluting stent placement to the mid/distal left circumflex as well as proximal left circumflex complex with 2 none overlapped stents.  He quit smoking after his myocardial infarction and has been attending cardiac rehab.  He has been doing well from a cardiac standpoint with no chest pain, shortness of breath or palpitations.  He has been attending cardiac rehab and has been taking his medications regularly.  He is here to evaluate right hand pain and swelling.  Since his radial catheterization in November, he had 3 episodes of right hand swelling with significant pain and decreased range of motion.  He has known history of gout but never in that location before.  No evidence of hematoma.   Past Medical History:  Diagnosis Date   Gout    Hypertension 12/26/2005   Morbid obesity ([place])    Plantar fascia rupture 08/29/2010   Tobacco use    a. Smokes 1 cigar daily.    Past Surgical History:  Procedure Laterality Date   CORONARY STENT  INTERVENTION N/A 12/19/2021   Procedure: CORONARY STENT INTERVENTION;  Surgeon: Wellington Hampshire, MD;  Location: [place] CV LAB;  Service: Cardiovascular;  Laterality: N/A;   KNEE ARTHROSCOPY     Left and right acl    LEFT HEART CATH AND CORONARY ANGIOGRAPHY N/A 12/19/2021   Procedure: LEFT HEART CATH AND CORONARY ANGIOGRAPHY;  Surgeon: Wellington Hampshire, MD;  Location: [place] CV LAB;  Service: Cardiovascular;  Laterality: N/A;   ORIF ELBOW FRACTURE     left   ORIF FEMUR FRACTURE     left   REPLACEMENT TOTAL KNEE     left   SHOULDER SURGERY  2003     Current Outpatient Medications  Medication Sig Dispense Refill   allopurinol (ZYLOPRIM) 100 MG tablet Take 1 tablet (100 mg total) by mouth daily. 30 tablet 6   aspirin EC 81 MG tablet Take 1 tablet (81 mg total) by mouth daily. Swallow whole.     atorvastatin (LIPITOR) 80 MG tablet Take 1 tablet (80 mg total) by mouth daily. 30 tablet 2   CALCIUM-MAGNESIUM-ZINC PO Take 3 tablets by mouth daily.     carvedilol (COREG) 3.125 MG tablet Take 1 tablet (3.125 mg total) by mouth 2 (two) times daily with a meal. 60 tablet 2   Cholecalciferol (VITAMIN D3) 125 MCG (5000 UT) CAPS Take 1 capsule by mouth daily.     colchicine 0.6 MG tablet Take 1 tablet (0.6 mg total) by mouth daily as needed. For gout flares, may take 2 tablets on the first  day of flare 30 tablet 3   loratadine (CLARITIN) 10 MG tablet Take 10 mg by mouth daily.     losartan (COZAAR) 25 MG tablet Take 1 tablet (25 mg total) by mouth daily. 90 tablet 0   prasugrel (EFFIENT) 10 MG TABS tablet Take 1 tablet (10 mg total) by mouth daily. 30 tablet 11   No current facility-administered medications for this visit.    Allergies:   Patient has no known allergies.    Social History:  The patient  reports that he quit smoking about 7 weeks ago. His smoking use included cigarettes and cigars. He has quit using smokeless tobacco. He reports that he does not drink alcohol and  does not use drugs.   Family History:  The patient's family history includes CAD in his paternal grandfather; CAD (age of onset: 10) in his maternal grandfather; Cancer in his maternal grandmother; Cancer (age of onset: 5) in an other family member; Diabetes in his paternal grandmother; Hypertension in his cousin, father, and paternal grandmother.    ROS:  Please see the history of present illness.   Otherwise, review of systems are positive for none.   All other systems are reviewed and negative.    PHYSICAL EXAM: VS:  BP 110/78 (BP Location: Left Arm, Patient Position: Sitting, Cuff Size: Normal)   Pulse 72   Ht 5\' 9"  (1.753 m)   Wt 252 lb 2 oz (114.4 kg)   SpO2 99%   BMI 37.23 kg/m  , BMI Body mass index is 37.23 kg/m. GEN: Well nourished, well developed, in no acute distress  HEENT: normal  Neck: no JVD, carotid bruits, or masses Cardiac: RRR; no murmurs, rubs, or gallops,no edema  Respiratory:  clear to auscultation bilaterally, normal work of breathing GI: soft, nontender, nondistended, + BS MS: no deformity or atrophy  Skin: warm and dry, no rash Neuro:  Strength and sensation are intact Psych: euthymic mood, full affect Right radial pulses normal with no hematoma.  There is mild swelling of the right arm mostly at the dorsal side with decreased range of motion compared to the left hand.   EKG:  EKG is not ordered today.    Recent Labs: 12/16/2021: ALT 35 12/19/2021: Magnesium 1.8 12/26/2021: BUN 20; Creatinine, Ser 1.03; Hemoglobin 15.4; Platelets 344; Potassium 4.4; Sodium 135    Lipid Panel    Component Value Date/Time   CHOL 208 (H) 12/18/2021 0114   TRIG 181 (H) 12/18/2021 0114   HDL 34 (L) 12/18/2021 0114   CHOLHDL 6.1 12/18/2021 0114   VLDL 36 12/18/2021 0114   LDLCALC 138 (H) 12/18/2021 0114   LDLDIRECT 114.0 12/16/2021 0946      Wt Readings from Last 3 Encounters:  02/12/22 252 lb 2 oz (114.4 kg)  02/10/22 254 lb (115.2 kg)  01/27/22 251 lb 9.6  oz (114.1 kg)          No data to display            ASSESSMENT AND PLAN:  1.  Right wrist pain: The right hand is clearly swollen with some tenderness and decreased range of motion.  His vascular exam is completely normal and I do not think this is vascular in etiology.  I am concerned about possible fasciitis post radial cath versus complex regional pain syndrome.  I am going to treat him with colchicine 0.6 mg twice daily for 1 week then 0.6 mg once daily after and will also prescribe a short course of Medrol.  I am referring him to rheumatology for evaluation of this and he might require physical therapy.  2.  Coronary artery disease involving native coronary arteries without angina: He is doing well with no recurrent anginal symptoms.  We refilled his cardiac medications. 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  3.  Essential hypertension: Blood pressure is well-controlled.  4.  Hyperlipidemia: Continue atorvastatin with a target LDL of less than 70.  5.  Cigar use: No recurrent use since his myocardial infarction.    Disposition:   FU with Christell Faith later this month as planned.  Signed,  Kathlyn Sacramento, MD  02/12/2022 10:33 AM    [place]

## 2022-02-13 ENCOUNTER — Encounter: Payer: BC Managed Care – PPO | Admitting: *Deleted

## 2022-02-13 DIAGNOSIS — I214 Non-ST elevation (NSTEMI) myocardial infarction: Secondary | ICD-10-CM | POA: Diagnosis not present

## 2022-02-13 DIAGNOSIS — Z955 Presence of coronary angioplasty implant and graft: Secondary | ICD-10-CM | POA: Diagnosis not present

## 2022-02-13 NOTE — Addendum Note (Signed)
Addended by: Valora Corporal on: 02/13/2022 09:03 AM   Modules accepted: Orders

## 2022-02-13 NOTE — Progress Notes (Signed)
Daily Session Note  Patient Details  Name: Eric Huff MRN: [id number] Date of Birth: 11-29-1970 Referring Provider:   Flowsheet Row Cardiac Rehab from 01/05/2022 in [facility] Cardiac and Pulmonary Rehab  Referring Provider Fletcher Anon       Encounter Date: 02/13/2022  Check In:  Session Check In - 02/13/22 1038       Check-In   Supervising physician immediately available to respond to emergencies See telemetry face sheet for immediately available ER MD    Location [facility]-Cardiac & Pulmonary Rehab    Staff Present Heath Lark, RN, BSN, CCRP;Marvell Fuller, PhD, RN, CNS, CEN;Kivon Tessie Fass, [place]    Virtual Visit No    Medication changes reported     No    Fall or balance concerns reported    No    Warm-up and Cool-down Performed on first and last piece of equipment    Resistance Training Performed Yes    VAD Patient? No    PAD/SET Patient? No      Pain Assessment   Currently in Pain? No/denies                Social History   Tobacco Use  Smoking Status Former   Types: Cigarettes, Cigars   Quit date: 12/19/2021   Years since quitting: 0.1  Smokeless Tobacco Former  Tobacco Comments   Quit cigarettes in 2009.  Quit cigars in November 2023    Goals Met:  Independence with exercise equipment Exercise tolerated well No report of concerns or symptoms today  Goals Unmet:  Not Applicable  Comments: Pt able to follow exercise prescription today without complaint.  Will continue to monitor for progression.    Dr. Emily Filbert is Medical Director for [place].  Dr. Ottie Glazier is Medical Director for [facility] Pulmonary Rehabilitation.

## 2022-02-16 ENCOUNTER — Encounter: Payer: BC Managed Care – PPO | Admitting: *Deleted

## 2022-02-16 DIAGNOSIS — I214 Non-ST elevation (NSTEMI) myocardial infarction: Secondary | ICD-10-CM | POA: Diagnosis not present

## 2022-02-16 DIAGNOSIS — Z955 Presence of coronary angioplasty implant and graft: Secondary | ICD-10-CM

## 2022-02-16 NOTE — Progress Notes (Signed)
Daily Session Note  Patient Details  Name: Eric Huff MRN: [id number] Date of Birth: 1970/10/23 Referring Provider:   Flowsheet Row Cardiac Rehab from 01/05/2022 in [facility] Cardiac and Pulmonary Rehab  Referring Provider Fletcher Anon       Encounter Date: 02/16/2022  Check In:  Session Check In - 02/16/22 1015       Check-In   Supervising physician immediately available to respond to emergencies See telemetry face sheet for immediately available ER MD    Location [facility]-Cardiac & Pulmonary Rehab    Staff Present Darlyne Russian, RN, Doyce Para, BS, ACSM CEP, Exercise Physiologist;Noah Tickle, BS, Exercise Physiologist    Virtual Visit No    Medication changes reported     No    Fall or balance concerns reported    No    Warm-up and Cool-down Performed on first and last piece of equipment    Resistance Training Performed Yes    VAD Patient? No    PAD/SET Patient? No      Pain Assessment   Currently in Pain? No/denies                Social History   Tobacco Use  Smoking Status Former   Types: Cigarettes, Cigars   Quit date: 12/19/2021   Years since quitting: 0.1  Smokeless Tobacco Former  Tobacco Comments   Quit cigarettes in 2009.  Quit cigars in November 2023    Goals Met:  Independence with exercise equipment Exercise tolerated well No report of concerns or symptoms today Strength training completed today  Goals Unmet:  Not Applicable  Comments: Pt able to follow exercise prescription today without complaint.  Will continue to monitor for progression.    Dr. Emily Filbert is Medical Director for [place].  Dr. Ottie Glazier is Medical Director for [facility] Pulmonary Rehabilitation.

## 2022-02-18 ENCOUNTER — Encounter: Payer: Self-pay | Admitting: *Deleted

## 2022-02-18 ENCOUNTER — Encounter: Payer: BC Managed Care – PPO | Admitting: *Deleted

## 2022-02-18 DIAGNOSIS — I214 Non-ST elevation (NSTEMI) myocardial infarction: Secondary | ICD-10-CM

## 2022-02-18 DIAGNOSIS — Z955 Presence of coronary angioplasty implant and graft: Secondary | ICD-10-CM

## 2022-02-18 NOTE — Progress Notes (Signed)
Cardiac Individual Treatment Plan  Patient Details  Name: Eric Huff MRN: [id number] Date of Birth: 11-10-1970 Referring Provider:   Flowsheet Row Cardiac Rehab from 01/05/2022 in [facility] Cardiac and Pulmonary Rehab  Referring Provider Arida       Initial Encounter Date:  Flowsheet Row Cardiac Rehab from 01/05/2022 in [facility] Cardiac and Pulmonary Rehab  Date 01/05/22       Visit Diagnosis: NSTEMI (non-ST elevated myocardial infarction) [facility])  Status post coronary artery stent placement  Patient's Home Medications on Admission:  Current Outpatient Medications:    allopurinol (ZYLOPRIM) 100 MG tablet, Take 1 tablet (100 mg total) by mouth daily., Disp: 30 tablet, Rfl: 6   aspirin EC 81 MG tablet, Take 1 tablet (81 mg total) by mouth daily. Swallow whole., Disp: , Rfl:    atorvastatin (LIPITOR) 80 MG tablet, Take 1 tablet (80 mg total) by mouth daily., Disp: 90 tablet, Rfl: 3   CALCIUM-MAGNESIUM-ZINC PO, Take 3 tablets by mouth daily., Disp: , Rfl:    carvedilol (COREG) 3.125 MG tablet, Take 1 tablet (3.125 mg total) by mouth 2 (two) times daily with a meal., Disp: 180 tablet, Rfl: 3   Cholecalciferol (VITAMIN D3) 125 MCG (5000 UT) CAPS, Take 1 capsule by mouth daily., Disp: , Rfl:    colchicine 0.6 MG tablet, Take one tablet twice daily for 7 days, then take one tablet daily, Disp: 37 tablet, Rfl: 0   loratadine (CLARITIN) 10 MG tablet, Take 10 mg by mouth daily., Disp: , Rfl:    losartan (COZAAR) 25 MG tablet, Take 1 tablet (25 mg total) by mouth daily., Disp: 90 tablet, Rfl: 3   methylPREDNISolone (MEDROL DOSEPAK) 4 MG TBPK tablet, Take 6 tablets the first day, 5 tablets the next day, 4 tablets the next, 3 tablets the next, 2 tablets the next and then the last tablet for a total of 6 days., Disp: 21 tablet, Rfl: 0   prasugrel (EFFIENT) 10 MG TABS tablet, Take 1 tablet (10 mg total) by mouth daily., Disp: 30 tablet, Rfl: 11  Past Medical History: Past Medical History:   Diagnosis Date   Gout    Hypertension 12/26/2005   Morbid obesity ([place])    Plantar fascia rupture 08/29/2010   Tobacco use    a. Smokes 1 cigar daily.    Tobacco Use: Social History   Tobacco Use  Smoking Status Former   Types: Cigarettes, Cigars   Quit date: 12/19/2021   Years since quitting: 0.1  Smokeless Tobacco Former  Tobacco Comments   Quit cigarettes in 2009.  Quit cigars in November 2023    Labs: Review Flowsheet  More data exists      Latest Ref Rng & Units 11/07/2018 12/15/2019 12/17/2020 12/16/2021 12/18/2021  Labs for ITP Cardiac and Pulmonary Rehab  Cholestrol 0 - 200 mg/dL 163  169  192  194  208   LDL (calc) 0 - 99 mg/dL 96  106  126  - 138   Direct LDL mg/dL - - - 114.0  -  HDL-C >40 mg/dL 39.60  42.10  38.00  36.20  34   Trlycerides <150 mg/dL 138.0  107.0  140.0  215.0  181   Hemoglobin A1c 4.6 - 6.5 % 5.8  5.7  5.9  5.9  -     Exercise Target Goals: Exercise Program Goal: Individual exercise prescription set using results from initial 6 min walk test and THRR while considering  patient's activity barriers and safety.   Exercise Prescription  Goal: Initial exercise prescription builds to 30-45 minutes a day of aerobic activity, 2-3 days per week.  Home exercise guidelines will be given to patient during program as part of exercise prescription that the participant will acknowledge.   Education: Aerobic Exercise: - Group verbal and visual presentation on the components of exercise prescription. Introduces F.I.T.T principle from ACSM for exercise prescriptions.  Reviews F.I.T.T. principles of aerobic exercise including progression. Written material given at graduation. Flowsheet Row Cardiac Rehab from 02/11/2022 in [facility] Cardiac and Pulmonary Rehab  Date 02/11/22  Educator [facility]  Instruction Review Code 1- Verbalizes Understanding       Education: Resistance Exercise: - Group verbal and visual presentation on the components of exercise  prescription. Introduces F.I.T.T principle from ACSM for exercise prescriptions  Reviews F.I.T.T. principles of resistance exercise including progression. Written material given at graduation.    Education: Exercise & Equipment Safety: - Individual verbal instruction and demonstration of equipment use and safety with use of the equipment. Flowsheet Row Cardiac Rehab from 02/11/2022 in [facility] Cardiac and Pulmonary Rehab  Date 01/05/22  Educator [facility]  Instruction Review Code 1- Verbalizes Understanding       Education: Exercise Physiology & General Exercise Guidelines: - Group verbal and written instruction with models to review the exercise physiology of the cardiovascular system and associated critical values. Provides general exercise guidelines with specific guidelines to those with heart or lung disease.  Flowsheet Row Cardiac Rehab from 02/11/2022 in [facility] Cardiac and Pulmonary Rehab  Education need identified 01/05/22  Date 02/04/22  Educator [facility]  Instruction Review Code 1- [employer] Understanding       Education: Flexibility, Balance, Mind/Body Relaxation: - Group verbal and visual presentation with interactive activity on the components of exercise prescription. Introduces F.I.T.T principle from ACSM for exercise prescriptions. Reviews F.I.T.T. principles of flexibility and balance exercise training including progression. Also discusses the mind body connection.  Reviews various relaxation techniques to help reduce and manage stress (i.e. Deep breathing, progressive muscle relaxation, and visualization). Balance handout provided to take home. Written material given at graduation.   Activity Barriers & Risk Stratification:  Activity Barriers & Cardiac Risk Stratification - 01/05/22 1506       Activity Barriers & Cardiac Risk Stratification   Activity Barriers Left Knee Replacement;Right Knee Replacement    Cardiac Risk Stratification Moderate             6 Minute Walk:  6  Minute Walk     Row Name 01/05/22 1505         6 Minute Walk   Phase Initial     Distance 1615 feet     Walk Time 6 minutes     # of Rest Breaks 0     MPH 3.06     METS 3.52     RPE 8     Perceived Dyspnea  0     VO2 Peak 12.32     Symptoms No     Resting HR 56 bpm     Resting BP 122/84     Resting Oxygen Saturation  98 %     Exercise Oxygen Saturation  during 6 min walk 98 %     Max Ex. HR 101 bpm     Max Ex. BP 158/80     2 Minute Post BP 124/62              Oxygen Initial Assessment:   Oxygen Re-Evaluation:   Oxygen Discharge (Final Oxygen Re-Evaluation):  Initial Exercise Prescription:  Initial Exercise Prescription - 01/05/22 1500       Date of Initial Exercise RX and Referring Provider   Date 01/05/22    Referring Provider Arida      Oxygen   Maintain Oxygen Saturation 88% or higher      Treadmill   MPH 3    Grade 1    Minutes 15    METs 3.71      Recumbant Bike   Level 3    RPM 50    Minutes 15    METs 3.52      Elliptical   Level 1    Speed 3    Minutes 15    METs 3.52      REL-XR   Level 3    Speed 50    Minutes 15    METs 3.52      Biostep-RELP   Level 3    SPM 50    Minutes 15    METs 3.52      Track   Laps 42    Minutes 15    METs 3.28      Prescription Details   Frequency (times per week) 3    Duration Progress to 30 minutes of continuous aerobic without signs/symptoms of physical distress      Intensity   THRR 40-80% of Max Heartrate 100-144    Ratings of Perceived Exertion 11-13    Perceived Dyspnea 0-4      Progression   Progression Continue to progress workloads to maintain intensity without signs/symptoms of physical distress.      Resistance Training   Training Prescription Yes    Weight 5    Reps 10-15             Perform Capillary Blood Glucose checks as needed.  Exercise Prescription Changes:   Exercise Prescription Changes     Row Name 01/05/22 1500 01/22/22 1700 02/04/22 0700  02/05/22 1400       Response to Exercise   Blood Pressure (Admit) 122/84 122/70 -- 124/76    Blood Pressure (Exercise) 158/80 164/68 -- 182/76    Blood Pressure (Exit) 124/62 118/76 -- 102/62    Heart Rate (Admit) 56 bpm 70 bpm -- 65 bpm    Heart Rate (Exercise) 101 bpm 125 bpm -- 127 bpm    Heart Rate (Exit) 55 bpm 77 bpm -- 87 bpm    Oxygen Saturation (Admit) 98 % -- -- --    Oxygen Saturation (Exercise) 98 % -- -- --    Oxygen Saturation (Exit) 98 % -- -- --    Rating of Perceived Exertion (Exercise) 8 14 -- 14    Perceived Dyspnea (Exercise) 0 -- -- --    Symptoms none none -- none    Comments 6 MWT results 4th full day of exercise -- --    Duration -- Continue with 30 min of aerobic exercise without signs/symptoms of physical distress. -- Continue with 30 min of aerobic exercise without signs/symptoms of physical distress.    Intensity -- THRR unchanged -- THRR unchanged      Progression   Progression -- Continue to progress workloads to maintain intensity without signs/symptoms of physical distress. -- Continue to progress workloads to maintain intensity without signs/symptoms of physical distress.    Average METs -- 4.39 -- 6.04      Resistance Training   Training Prescription -- Yes -- Yes    Weight -- 5 lb -- 5  lb    Reps -- 10-15 -- 10-15      Interval Training   Interval Training -- No -- No      Treadmill   MPH -- 3.5 -- 3.2    Grade -- 2 -- 3.5    Minutes -- 15 -- 15    METs -- 4.64 -- 4.99      Recumbant Bike   Level -- 7 -- --    Watts -- 78 -- --    Minutes -- 15 -- --    METs -- 4.15 -- --      Elliptical   Level -- 1 -- 5    Speed -- -- -- 3.3    Minutes -- 15 -- 15    METs -- 4.1 -- 4.77      REL-XR   Level -- 8 -- 8    Minutes -- 15 -- 15    METs -- 6.5 -- 9.4      Biostep-RELP   Level -- 3 -- --    Minutes -- 15 -- --    METs -- 3 -- --      Home Exercise Plan   Plans to continue exercise at -- -- Home (comment)  walking, gym at work  Home (comment)  walking, gym at work    Frequency -- -- Add 2 additional days to program exercise sessions. Add 2 additional days to program exercise sessions.    Initial Home Exercises Provided -- -- 02/04/22 02/04/22      Oxygen   Maintain Oxygen Saturation -- 88% or higher -- 88% or higher             Exercise Comments:   Exercise Comments     Row Name 01/07/22 1058           Exercise Comments First full day of exercise!  Patient was oriented to gym and equipment including functions, settings, policies, and procedures.  Patient's individual exercise prescription and treatment plan were reviewed.  All starting workloads were established based on the results of the 6 minute walk test done at initial orientation visit.  The plan for exercise progression was also introduced and progression will be customized based on patient's performance and goals.                Exercise Goals and Review:   Exercise Goals     Row Name 01/05/22 1513             Exercise Goals   Increase Physical Activity Yes       Intervention Provide advice, education, support and counseling about physical activity/exercise needs.;Develop an individualized exercise prescription for aerobic and resistive training based on initial evaluation findings, risk stratification, comorbidities and participant's personal goals.       Expected Outcomes Short Term: Attend rehab on a regular basis to increase amount of physical activity.;Long Term: Add in home exercise to make exercise part of routine and to increase amount of physical activity.;Long Term: Exercising regularly at least 3-5 days a week.       Increase Strength and Stamina Yes       Intervention Provide advice, education, support and counseling about physical activity/exercise needs.;Develop an individualized exercise prescription for aerobic and resistive training based on initial evaluation findings, risk stratification, comorbidities and  participant's personal goals.       Expected Outcomes Short Term: Increase workloads from initial exercise prescription for resistance, speed, and METs.;Short Term: Perform resistance training exercises  routinely during rehab and add in resistance training at home;Long Term: Improve cardiorespiratory fitness, muscular endurance and strength as measured by increased METs and functional capacity (6MWT)       Able to understand and use rate of perceived exertion (RPE) scale Yes       Intervention Provide education and explanation on how to use RPE scale       Expected Outcomes Short Term: Able to use RPE daily in rehab to express subjective intensity level;Long Term:  Able to use RPE to guide intensity level when exercising independently       Able to understand and use Dyspnea scale Yes       Intervention Provide education and explanation on how to use Dyspnea scale       Expected Outcomes Short Term: Able to use Dyspnea scale daily in rehab to express subjective sense of shortness of breath during exertion;Long Term: Able to use Dyspnea scale to guide intensity level when exercising independently       Knowledge and understanding of Target Heart Rate Range (THRR) Yes       Intervention Provide education and explanation of THRR including how the numbers were predicted and where they are located for reference       Expected Outcomes Short Term: Able to state/look up THRR;Long Term: Able to use THRR to govern intensity when exercising independently;Short Term: Able to use daily as guideline for intensity in rehab       Able to check pulse independently Yes       Intervention Provide education and demonstration on how to check pulse in carotid and radial arteries.;Review the importance of being able to check your own pulse for safety during independent exercise       Expected Outcomes Short Term: Able to explain why pulse checking is important during independent exercise;Long Term: Able to check pulse  independently and accurately       Understanding of Exercise Prescription Yes       Intervention Provide education, explanation, and written materials on patient's individual exercise prescription       Expected Outcomes Short Term: Able to explain program exercise prescription;Long Term: Able to explain home exercise prescription to exercise independently                Exercise Goals Re-Evaluation :  Exercise Goals Re-Evaluation     Row Name 01/07/22 1058 01/22/22 1706 02/04/22 0752 02/05/22 1433       Exercise Goal Re-Evaluation   Exercise Goals Review Able to understand and use rate of perceived exertion (RPE) scale;Able to understand and use Dyspnea scale;Knowledge and understanding of Target Heart Rate Range (THRR);Understanding of Exercise Prescription Increase Physical Activity;Increase Strength and Stamina;Understanding of Exercise Prescription Increase Physical Activity;Increase Strength and Stamina;Understanding of Exercise Prescription;Able to understand and use rate of perceived exertion (RPE) scale;Knowledge and understanding of Target Heart Rate Range (THRR);Able to understand and use Dyspnea scale;Able to check pulse independently Increase Physical Activity;Increase Strength and Stamina;Understanding of Exercise Prescription    Comments Reviewed RPE scale, THR and program prescription with pt today.  Pt voiced understanding and was given a copy of goals to take home. Eric Huff is doing very well for the first couple of sessions he has been at rehab. He has significanly increased most of his workloads already. Recumbent bike increased to level 7 working at 78 watts! He also increased to a 3.2 mph/3% incline. He worked over 6.5 METs on level on the XR as well! All RPEs  are appropriate. We will continue to monitor as he progresses. Eric Huff is doing well in rehab.  He is trying to improve his stamina and pushing himself more each day.  He was asking about his THR today.  Reviewed home exercise  with pt today.  Pt plans to walk and use gym at work for exercise.  Reviewed THR, pulse, RPE, sign and symptoms, pulse oximetery and when to call 911 or MD.  Also discussed weather considerations and indoor options.  Pt voiced understanding. [name] continues to do well in rehab. He recently increased his overall average MET level to 6.04 METs. He also was able to improve to level 5 on the elliptical at a speed of 3.3 mph. He also increased his workload on the treadmill to a speed of 3.2 mph and an incline of 3.5%. We will continue to monitor his progress in the program.    Expected Outcomes Short: Use RPE daily to regulate intensity. Long: Follow program prescription in THR. Short: Continue to follow current exercise prescription Long: Build up overall strength and stamina Short: Start getting to gym on regular basis Long: continue to improve stamina Short: Continue to increase workloads. Long: Continue to improve strength and stamina.             Discharge Exercise Prescription (Final Exercise Prescription Changes):  Exercise Prescription Changes - 02/05/22 1400       Response to Exercise   Blood Pressure (Admit) 124/76    Blood Pressure (Exercise) 182/76    Blood Pressure (Exit) 102/62    Heart Rate (Admit) 65 bpm    Heart Rate (Exercise) 127 bpm    Heart Rate (Exit) 87 bpm    Rating of Perceived Exertion (Exercise) 14    Symptoms none    Duration Continue with 30 min of aerobic exercise without signs/symptoms of physical distress.    Intensity THRR unchanged      Progression   Progression Continue to progress workloads to maintain intensity without signs/symptoms of physical distress.    Average METs 6.04      Resistance Training   Training Prescription Yes    Weight 5 lb    Reps 10-15      Interval Training   Interval Training No      Treadmill   MPH 3.2    Grade 3.5    Minutes 15    METs 4.99      Elliptical   Level 5    Speed 3.3    Minutes 15    METs 4.77       REL-XR   Level 8    Minutes 15    METs 9.4      Home Exercise Plan   Plans to continue exercise at Home (comment)   walking, gym at work   Frequency Add 2 additional days to program exercise sessions.    Initial Home Exercises Provided 02/04/22      Oxygen   Maintain Oxygen Saturation 88% or higher             Nutrition:  Target Goals: Understanding of nutrition guidelines, daily intake of sodium 1500mg , cholesterol 200mg , calories 30% from fat and 7% or less from saturated fats, daily to have 5 or more servings of fruits and vegetables.  Education: All About Nutrition: -Group instruction provided by verbal, written material, interactive activities, discussions, models, and posters to present general guidelines for heart healthy nutrition including fat, fiber, MyPlate, the role of sodium in heart healthy nutrition,  utilization of the nutrition label, and utilization of this knowledge for meal planning. Follow up email sent as well. Written material given at graduation. Flowsheet Row Cardiac Rehab from 02/11/2022 in [facility] Cardiac and Pulmonary Rehab  Education need identified 01/05/22       Biometrics:  Pre Biometrics - 01/05/22 1514       Pre Biometrics   Height 5' 9.5" (1.765 m)    Weight 252 lb 9.6 oz (114.6 kg)    Waist Circumference 49.5 inches    Hip Circumference 48 inches    Waist to Hip Ratio 1.03 %    BMI (Calculated) 36.78    Single Leg Stand 30 seconds              Nutrition Therapy Plan and Nutrition Goals:  Nutrition Therapy & Goals - 01/05/22 1426       Nutrition Therapy   Diet Heart healthy, low Na    Drug/Food Interactions Statins/Certain Fruits    Protein (specify units) 85-95g    Fiber 30 grams    Whole Grain Foods 3 servings    Saturated Fats 16 max. grams    Fruits and Vegetables 8 servings/day    Sodium 2 grams      Personal Nutrition Goals   Nutrition Goal ST: review paperwork, practice MyPlate guidelines LT: follow MyPlate  guidelines, limit saturated fat <16g, limit Na <2g    Comments 52 y.o. M admitted to cardiac rehab s/p NSTEMI. PMHx includes HTN, prediabetes, HLD, gout, CAD, vit D deficiency. Relevant medications includes lipitor, calcium/magnesium/zinc, vit D3. 3x/week will eat at work; he works at [employer]. They have donated many canned items higher in sodium and have been cutting back on sodium in general. they have switched to honey-wheat bread and now having oatmeal with berries. He was eating 2 meals and is now eating 3 meals per day. Discussed heart healthy eating and gout friendly eating.      Intervention Plan   Intervention Prescribe, educate and counsel regarding individualized specific dietary modifications aiming towards targeted core components such as weight, hypertension, lipid management, diabetes, heart failure and other comorbidities.;Nutrition handout(s) given to patient.    Expected Outcomes Short Term Goal: Understand basic principles of dietary content, such as calories, fat, sodium, cholesterol and nutrients.;Short Term Goal: A plan has been developed with personal nutrition goals set during dietitian appointment.;Long Term Goal: Adherence to prescribed nutrition plan.             Nutrition Assessments:  MEDIFICTS Score Key: ?70 Need to make dietary changes  40-70 Heart Healthy Diet ? 40 Therapeutic Level Cholesterol Diet  Flowsheet Row Cardiac Rehab from 01/05/2022 in [facility] Cardiac and Pulmonary Rehab  Picture Your Plate Total Score on Admission 45      Picture Your Plate Scores: [id number] Unhealthy dietary pattern with much room for improvement. 41-50 Dietary pattern unlikely to meet recommendations for good health and room for improvement. 51-60 More healthful dietary pattern, with some room for improvement.  >60 Healthy dietary pattern, although there may be some specific behaviors that could be improved.    Nutrition Goals Re-Evaluation:  Nutrition Goals Re-Evaluation     [place]  Name 02/04/22 0757             Goals   Nutrition Goal ST: review paperwork, practice MyPlate guidelines LT: follow MyPlate guidelines, limit saturated fat <16g, limit Na <2g       Comment [name] is doing well in rehab.  He is doing well with his  diet.  He is trying to use the MyPlate guidelines and watch his salt and sugar.  He is doing well with getting in his fruits and vegetables as well.  [name] has been working on cutting back on portion and eating more frequently versus big meals       Expected Outcome Short: continue to watch sodium Long: continue to work on portion control                Nutrition Goals Discharge (Final Nutrition Goals Re-Evaluation):  Nutrition Goals Re-Evaluation - 02/04/22 0757       Goals   Nutrition Goal ST: review paperwork, practice MyPlate guidelines LT: follow MyPlate guidelines, limit saturated fat <16g, limit Na <2g    Comment [name] is doing well in rehab.  He is doing well with his diet.  He is trying to use the MyPlate guidelines and watch his salt and sugar.  He is doing well with getting in his fruits and vegetables as well.  [name] has been working on cutting back on portion and eating more frequently versus big meals    Expected Outcome Short: continue to watch sodium Long: continue to work on portion control             Psychosocial: Target Goals: Acknowledge presence or absence of significant depression and/or stress, maximize coping skills, provide positive support system. Participant is able to verbalize types and ability to use techniques and skills needed for reducing stress and depression.   Education: Stress, Anxiety, and Depression - Group verbal and visual presentation to define topics covered.  Reviews how body is impacted by stress, anxiety, and depression.  Also discusses healthy ways to reduce stress and to treat/manage anxiety and depression.  Written material given at graduation.   Education: Sleep Hygiene -Provides group verbal  and written instruction about how sleep can affect your health.  Define sleep hygiene, discuss sleep cycles and impact of sleep habits. Review good sleep hygiene tips.    Initial Review & Psychosocial Screening:  Initial Psych Review & Screening - 12/30/21 1307       Initial Review   Current issues with None Identified      Family Dynamics   Good Support System? Yes   wife, family     Barriers   Psychosocial barriers to participate in program There are no identifiable barriers or psychosocial needs.;The patient should benefit from training in stress management and relaxation.      Screening Interventions   Interventions Encouraged to exercise;Provide feedback about the scores to participant;To provide support and resources with identified psychosocial needs    Expected Outcomes Short Term goal: Utilizing psychosocial counselor, staff and physician to assist with identification of specific Stressors or current issues interfering with healing process. Setting desired goal for each stressor or current issue identified.;Short Term goal: Identification and review with participant of any Quality of Life or Depression concerns found by scoring the questionnaire.;Long Term Goal: Stressors or current issues are controlled or eliminated.;Long Term goal: The participant improves quality of Life and PHQ9 Scores as seen by post scores and/or verbalization of changes             Quality of Life Scores:   Quality of Life - 01/05/22 1516       Quality of Life   Select Quality of Life      Quality of Life Scores   Health/Function Pre 28 %    Socioeconomic Pre 27.19 %    Psych/Spiritual Pre  30 %    Family Pre 30 %    GLOBAL Pre 28.5 %            Scores of 19 and below usually indicate a poorer quality of life in these areas.  A difference of  2-3 points is a clinically meaningful difference.  A difference of 2-3 points in the total score of the Quality of Life Index has been associated  with significant improvement in overall quality of life, self-image, physical symptoms, and general health in studies assessing change in quality of life.  PHQ-9: Review Flowsheet  More data exists      02/10/2022 01/05/2022 12/26/2021 12/24/2020 12/20/2019  Depression screen PHQ 2/9  Decreased Interest 0 0 0 0 0  Down, Depressed, Hopeless 0 0 0 0 0  PHQ - 2 Score 0 0 0 0 0  Altered sleeping - 0 - - -  Tired, decreased energy - 0 - - -  Change in appetite - 0 - - -  Feeling bad or failure about yourself  - 0 - - -  Moving slowly or fidgety/restless - 0 - - -  Suicidal thoughts - 0 - - -  PHQ-9 Score - 0 - - -  Difficult doing work/chores - Not difficult at all - - -   Interpretation of Total Score  Total Score Depression Severity:  1-4 = Minimal depression, 5-9 = Mild depression, 10-14 = Moderate depression, 15-19 = Moderately severe depression, 20-27 = Severe depression   Psychosocial Evaluation and Intervention:  Psychosocial Evaluation - 12/30/21 1313       Psychosocial Evaluation & Interventions   Interventions Encouraged to exercise with the program and follow exercise prescription    Comments Eric Huff 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 He reports feeling well and wanting to increase his activity level. He thinks he will be out of work for 2 months. 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 He reports no stress concerns. His wife and family are very supportive. He is looking forward to learning more about the nutritional aspect of heart healthy living as that is his and his wife's main topic of discussion    Expected Outcomes Short: attend cardiac rehab for education and exercise. Long: develop and maintain positive self care habits.    Continue Psychosocial Services  Follow up required by staff             Psychosocial Re-Evaluation:  Psychosocial Re-Evaluation     [place] Name 02/04/22 [phone]             Psychosocial  Re-Evaluation   Current issues with Current Stress Concerns       Comments Eric Huff is doing well in rehab.  He goes back to work on February 1.  He tries not to let too many things get to him.  He does have a stress test scheduled for 1/30 prior to returning to work.  Once he gets back, he will need to change his schedule up some.  He does not that he has an occasional pain that doesn't last long.  He does want to finish the program.  Overall, he is staying positive. He also sleeps well for most part.       Expected Outcomes short: continue to prepare to return to work Long: Continue to stay positive       Interventions Encouraged to attend Cardiac Rehabilitation for the exercise  Continue Psychosocial Services  Follow up required by staff                Psychosocial Discharge (Final Psychosocial Re-Evaluation):  Psychosocial Re-Evaluation - 02/04/22 0754       Psychosocial Re-Evaluation   Current issues with Current Stress Concerns    Comments Eric Huff is doing well in rehab.  He goes back to work on February 1.  He tries not to let too many things get to him.  He does have a stress test scheduled for 1/30 prior to returning to work.  Once he gets back, he will need to change his schedule up some.  He does not that he has an occasional pain that doesn't last long.  He does want to finish the program.  Overall, he is staying positive. He also sleeps well for most part.    Expected Outcomes short: continue to prepare to return to work Long: Continue to stay positive    Interventions Encouraged to attend Cardiac Rehabilitation for the exercise    Continue Psychosocial Services  Follow up required by staff             Vocational Rehabilitation: Provide vocational rehab assistance to qualifying candidates.   Vocational Rehab Evaluation & Intervention:  Vocational Rehab - 12/30/21 1307       Initial Vocational Rehab Evaluation & Intervention   Assessment shows need for Vocational  Rehabilitation No             Education: Education Goals: Education classes will be provided on a variety of topics geared toward better understanding of heart health and risk factor modification. Participant will state understanding/return demonstration of topics presented as noted by education test scores.  Learning Barriers/Preferences:  Learning Barriers/Preferences - 12/30/21 1307       Learning Barriers/Preferences   Learning Barriers None    Learning Preferences None             General Cardiac Education Topics:  AED/CPR: - Group verbal and written instruction with the use of models to demonstrate the basic use of the AED with the basic ABC's of resuscitation.   Anatomy and Cardiac Procedures: - Group verbal and visual presentation and models provide information about basic cardiac anatomy and function. Reviews the testing methods done to diagnose heart disease and the outcomes of the test results. Describes the treatment choices: Medical Management, Angioplasty, or Coronary Bypass Surgery for treating various heart conditions including Myocardial Infarction, Angina, Valve Disease, and Cardiac Arrhythmias.  Written material given at graduation.   Medication Safety: - Group verbal and visual instruction to review commonly prescribed medications for heart and lung disease. Reviews the medication, class of the drug, and side effects. Includes the steps to properly store meds and maintain the prescription regimen.  Written material given at graduation. Flowsheet Row Cardiac Rehab from 02/11/2022 in [facility] Cardiac and Pulmonary Rehab  Date 01/07/22  Educator SB  Instruction Review Code 1- Verbalizes Understanding       Intimacy: - Group verbal instruction through game format to discuss how heart and lung disease can affect sexual intimacy. Written material given at graduation.. Flowsheet Row Cardiac Rehab from 02/11/2022 in [facility] Cardiac and Pulmonary Rehab  Date 02/11/22   Educator [facility]  Instruction Review Code 1- Verbalizes Understanding       Know Your Numbers and Heart Failure: - Group verbal and visual instruction to discuss disease risk factors for cardiac and pulmonary disease and treatment options.  Reviews associated critical values for  Overweight/Obesity, Hypertension, Cholesterol, and Diabetes.  Discusses basics of heart failure: signs/symptoms and treatments.  Introduces Heart Failure Zone chart for action plan for heart failure.  Written material given at graduation. Flowsheet Row Cardiac Rehab from 02/11/2022 in [facility] Cardiac and Pulmonary Rehab  Date 01/14/22  Educator SB  Instruction Review Code 1- Verbalizes Understanding       Infection Prevention: - Provides verbal and written material to individual with discussion of infection control including proper hand washing and proper equipment cleaning during exercise session. Flowsheet Row Cardiac Rehab from 02/11/2022 in [facility] Cardiac and Pulmonary Rehab  Date 01/05/22  Educator [facility]  Instruction Review Code 1- Verbalizes Understanding       Falls Prevention: - Provides verbal and written material to individual with discussion of falls prevention and safety. Flowsheet Row Cardiac Rehab from 02/11/2022 in [facility] Cardiac and Pulmonary Rehab  Date 01/05/22  Educator [facility]  Instruction Review Code 1- Verbalizes Understanding       Other: -Provides group and verbal instruction on various topics (see comments)   Knowledge Questionnaire Score:  Knowledge Questionnaire Score - 01/05/22 1517       Knowledge Questionnaire Score   Pre Score 24/26             Core Components/Risk Factors/Patient Goals at Admission:  Personal Goals and Risk Factors at Admission - 01/05/22 1517       Core Components/Risk Factors/Patient Goals on Admission    Weight Management Yes;Weight Loss    Intervention Weight Management: Develop a combined nutrition and exercise program designed to reach desired  caloric intake, while maintaining appropriate intake of nutrient and fiber, sodium and fats, and appropriate energy expenditure required for the weight goal.;Weight Management: Provide education and appropriate resources to help participant work on and attain dietary goals.;Weight Management/Obesity: Establish reasonable short term and long term weight goals.;Obesity: Provide education and appropriate resources to help participant work on and attain dietary goals.    Admit Weight 252 lb 9.6 oz (114.6 kg)    Goal Weight: Short Term 245 lb (111.1 kg)    Goal Weight: Long Term 200 lb (90.7 kg)    Expected Outcomes Short Term: Continue to assess and modify interventions until short term weight is achieved;Long Term: Adherence to nutrition and physical activity/exercise program aimed toward attainment of established weight goal;Weight Loss: Understanding of general recommendations for a balanced deficit meal plan, which promotes 1-2 lb weight loss per week and includes a negative energy balance of [phone] kcal/d;Understanding recommendations for meals to include 15-35% energy as protein, 25-35% energy from fat, 35-60% energy from carbohydrates, less than 200mg  of dietary cholesterol, 20-35 gm of total fiber daily;Understanding of distribution of calorie intake throughout the day with the consumption of 4-5 meals/snacks    Hypertension Yes    Intervention Provide education on lifestyle modifcations including regular physical activity/exercise, weight management, moderate sodium restriction and increased consumption of fresh fruit, vegetables, and low fat dairy, alcohol moderation, and smoking cessation.;Monitor prescription use compliance.    Expected Outcomes Short Term: Continued assessment and intervention until BP is < 140/75mm HG in hypertensive participants. < 130/13mm HG in hypertensive participants with diabetes, heart failure or chronic kidney disease.;Long Term: Maintenance of blood pressure at goal  levels.    Lipids Yes    Intervention Provide education and support for participant on nutrition & aerobic/resistive exercise along with prescribed medications to achieve LDL 70mg , HDL >40mg .    Expected Outcomes Short Term: Participant states understanding of desired cholesterol values and  is compliant with medications prescribed. Participant is following exercise prescription and nutrition guidelines.;Long Term: Cholesterol controlled with medications as prescribed, with individualized exercise RX and with personalized nutrition plan. Value goals: LDL < 70mg , HDL > 40 mg.             Education:Diabetes - Individual verbal and written instruction to review signs/symptoms of diabetes, desired ranges of glucose level fasting, after meals and with exercise. Acknowledge that pre and post exercise glucose checks will be done for 3 sessions at entry of program.   Core Components/Risk Factors/Patient Goals Review:   Goals and Risk Factor Review     Row Name 02/04/22 0759             Core Components/Risk Factors/Patient Goals Review   Personal Goals Review Weight Management/Obesity;Hypertension       Review Eric Huff is doing well in rehab.   He is starting to lose his weight.  He can now tie his shoes and get to his feet easier.  His pessures are doing well and he checks them at least once a day in the morning at home.  He is still trying to fight off cold symptoms.       Expected Outcomes Short; Continue to work on weight loss lOng: Continue to monitor risk factors                Core Components/Risk Factors/Patient Goals at Discharge (Final Review):   Goals and Risk Factor Review - 02/04/22 0759       Core Components/Risk Factors/Patient Goals Review   Personal Goals Review Weight Management/Obesity;Hypertension    Review Eric Huff is doing well in rehab.   He is starting to lose his weight.  He can now tie his shoes and get to his feet easier.  His pessures are doing well and he checks  them at least once a day in the morning at home.  He is still trying to fight off cold symptoms.    Expected Outcomes Short; Continue to work on weight loss lOng: Continue to monitor risk factors             ITP Comments:  ITP Comments     Row Name 12/30/21 1320 01/05/22 1505 01/07/22 1058 01/21/22 1010 02/18/22 0918   ITP Comments Initial telephone orientation completed. Diagnosis can be found in [facility] 11/22. EP orientation scheduled for Monday 12/11 at 1:30. Completed 6MWT and gym orientation. Initial ITP created and sent for review to Dr. Emily Filbert, Medical Director. First full day of exercise!  Patient was oriented to gym and equipment including functions, settings, policies, and procedures.  Patient's individual exercise prescription and treatment plan were reviewed.  All starting workloads were established based on the results of the 6 minute walk test done at initial orientation visit.  The plan for exercise progression was also introduced and progression will be customized based on patient's performance and goals. 30 Day review completed. Medical Director ITP review done, changes made as directed, and signed approval by Medical Director.     new to program 30 Day review completed. Medical Director ITP review done, changes made as directed, and signed approval by Medical Director.            Comments:

## 2022-02-18 NOTE — Progress Notes (Signed)
Daily Session Note  Patient Details  Name: CHRISTIA COAXUM MRN: [id number] Date of Birth: 1970-05-11 Referring Provider:   Flowsheet Row Cardiac Rehab from 01/05/2022 in [facility] Cardiac and Pulmonary Rehab  Referring Provider Fletcher Anon       Encounter Date: 02/18/2022  Check In:  Session Check In - 02/18/22 0953       Check-In   Supervising physician immediately available to respond to emergencies See telemetry face sheet for immediately available ER MD    Location [facility]-Cardiac & Pulmonary Rehab    Staff Present Darlyne Russian, RN, ADN;Meredith Sherryll Burger, RN BSN;Kino Hood, RCP,RRT,BSRT;Noah Tickle, BS, Exercise Physiologist    Virtual Visit No    Medication changes reported     No    Fall or balance concerns reported    No    Warm-up and Cool-down Performed on first and last piece of equipment    Resistance Training Performed Yes    VAD Patient? No    PAD/SET Patient? No      Pain Assessment   Currently in Pain? No/denies                Social History   Tobacco Use  Smoking Status Former   Types: Cigarettes, Cigars   Quit date: 12/19/2021   Years since quitting: 0.1  Smokeless Tobacco Former  Tobacco Comments   Quit cigarettes in 2009.  Quit cigars in November 2023    Goals Met:  Independence with exercise equipment Exercise tolerated well No report of concerns or symptoms today Strength training completed today  Goals Unmet:  Not Applicable  Comments: Pt able to follow exercise prescription today without complaint.  Will continue to monitor for progression.    Dr. Emily Filbert is Medical Director for [place].  Dr. Ottie Glazier is Medical Director for [facility] Pulmonary Rehabilitation.

## 2022-02-20 ENCOUNTER — Encounter: Payer: BC Managed Care – PPO | Admitting: *Deleted

## 2022-02-20 DIAGNOSIS — I214 Non-ST elevation (NSTEMI) myocardial infarction: Secondary | ICD-10-CM | POA: Diagnosis not present

## 2022-02-20 DIAGNOSIS — Z955 Presence of coronary angioplasty implant and graft: Secondary | ICD-10-CM | POA: Diagnosis not present

## 2022-02-20 NOTE — Progress Notes (Signed)
Daily Session Note  Patient Details  Name: TONNY ISENSEE MRN: [id number] Date of Birth: 24-Dec-1970 Referring Provider:   Flowsheet Row Cardiac Rehab from 01/05/2022 in [facility] Cardiac and Pulmonary Rehab  Referring Provider Fletcher Anon       Encounter Date: 02/20/2022  Check In:  Session Check In - 02/20/22 1044       Check-In   Supervising physician immediately available to respond to emergencies See telemetry face sheet for immediately available ER MD    Location [facility]-Cardiac & Pulmonary Rehab    Staff Present Heath Lark, RN, BSN, CCRP;Jessica [place], MA, RCEP, CCRP, CCET;Lonnel [place], [place]    Virtual Visit No    Medication changes reported     No    Fall or balance concerns reported    No    Warm-up and Cool-down Performed on first and last piece of equipment    Resistance Training Performed Yes    VAD Patient? No    PAD/SET Patient? No      Pain Assessment   Currently in Pain? No/denies                Social History   Tobacco Use  Smoking Status Former   Types: Cigarettes, Cigars   Quit date: 12/19/2021   Years since quitting: 0.1  Smokeless Tobacco Former  Tobacco Comments   Quit cigarettes in 2009.  Quit cigars in November 2023    Goals Met:  Independence with exercise equipment Exercise tolerated well No report of concerns or symptoms today  Goals Unmet:  Not Applicable  Comments: Pt able to follow exercise prescription today without complaint.  Will continue to monitor for progression.    Dr. Emily Filbert is Medical Director for [place].  Dr. Ottie Glazier is Medical Director for [facility] Pulmonary Rehabilitation.

## 2022-02-20 NOTE — Addendum Note (Signed)
Addended by: Rise Mu on: 02/20/2022 07:24 AM   Modules accepted: Orders

## 2022-02-23 ENCOUNTER — Encounter: Payer: BC Managed Care – PPO | Admitting: *Deleted

## 2022-02-23 DIAGNOSIS — I214 Non-ST elevation (NSTEMI) myocardial infarction: Secondary | ICD-10-CM | POA: Diagnosis not present

## 2022-02-23 DIAGNOSIS — Z955 Presence of coronary angioplasty implant and graft: Secondary | ICD-10-CM | POA: Diagnosis not present

## 2022-02-23 NOTE — Progress Notes (Signed)
Daily Session Note  Patient Details  Name: Eric Huff MRN: [id number] Date of Birth: Mar 17, 1970 Referring Provider:   Flowsheet Row Cardiac Rehab from 01/05/2022 in [facility] Cardiac and Pulmonary Rehab  Referring Provider Fletcher Anon       Encounter Date: 02/23/2022  Check In:  Session Check In - 02/23/22 1003       Check-In   Supervising physician immediately available to respond to emergencies See telemetry face sheet for immediately available ER MD    Location [facility]-Cardiac & Pulmonary Rehab    Staff Present Darlyne Russian, RN, Doyce Para, BS, ACSM CEP, Exercise Physiologist;Noah Tickle, BS, Exercise Physiologist    Virtual Visit No    Medication changes reported     No    Fall or balance concerns reported    No    Warm-up and Cool-down Performed on first and last piece of equipment    Resistance Training Performed Yes    VAD Patient? No    PAD/SET Patient? No      Pain Assessment   Currently in Pain? No/denies                Social History   Tobacco Use  Smoking Status Former   Types: Cigarettes, Cigars   Quit date: 12/19/2021   Years since quitting: 0.1  Smokeless Tobacco Former  Tobacco Comments   Quit cigarettes in 2009.  Quit cigars in November 2023    Goals Met:  Independence with exercise equipment Exercise tolerated well No report of concerns or symptoms today Strength training completed today  Goals Unmet:  Not Applicable  Comments: Pt able to follow exercise prescription today without complaint.  Will continue to monitor for progression.    Dr. Emily Filbert is Medical Director for [place].  Dr. Ottie Glazier is Medical Director for [facility] Pulmonary Rehabilitation.

## 2022-02-23 NOTE — Progress Notes (Unsigned)
Cardiology Office Note    Date:  02/25/2022   ID:  [name], [name] May 01, 1970, MRN [id number]  PCP:  Ria Bush, MD  Cardiologist:  Kathlyn Sacramento, MD  Electrophysiologist:  None   Chief Complaint: Follow up  History of Present Illness:   Eric Huff is a 52 y.o. male with history of CAD with NSTEMI status post PCI to the LCx in 11/2021, HTN, tobacco use, gout, and obesity who presents for follow-up of CAD.   Prior to his admission in 11/2021, he had no previously known cardiac history.  He was admitted to the hospital from 11/22 through 12/19/2021 with an NSTEMI.  High-sensitivity troponin trended to 5865.  Echo showed an EF of 55 to 60%, no regional wall motion abnormalities, normal LV diastolic function parameters, normal RV systolic function and ventricular cavity size, trivial mitral regurgitation, and an estimated right atrial pressure of 8 mmHg.  LHC showed severe two-vessel CAD with chronically occluded RCA with well-developed left-to-right collaterals as well as severe stenosis affecting the LCx which was felt to be the likely culprit for his NSTEMI.  The LAD had mild to moderate nonobstructive disease.  High normal LVEDP.  He underwent successful PCI/DES to the mid to distal LCx as well as proximal LCx.  The stents were not overlapped.  His post-hospital course has been complicated by ongoing right wrist pain, initially thought to be related to gout.  He was seen by Dr. Fletcher Anon on 02/12/2022 for evaluation of continued right wrist noting 3 episodes of right hand pain, swelling and decreased range of motion. There was no hematoma and pulses were intact. There was concern for possible fasciitis post radial cath verus complex regional pain syndrome. He was treated with colchicine 0.6 mgbid x 1 week followed by 0.6 mg daily thereafter along with prednisone. He was also referred to rheumatology.    As part of DOT clearance, he underwent GXT on January 30 and was able to walk  for 6 minutes and 34 seconds.  He was noted to have 1 mm horizontal ST segment depression in leads V5 and V6.  Recommendation was made for either nuclear imaging or cardiac CTA.  In light of prior stenting, nuclear imaging more appropriate.  Today, patient presents with his significant other.  He has been feeling well and participating in cardiac rehabilitation without symptoms or limitations.  He denies chest pain, dyspnea, palpitations, PND, orthopnea, dizziness, syncope, edema, or early satiety.   Past Medical History:  Diagnosis Date   CAD (coronary artery disease)    a. 11/2021 NSTEMI/PCI: LAD 30/69m, RI min irregs, LCX 95p/m (3.0x15 Onyx Frontier DES), 1m/d (2.5x22 Onyx Frontier DES), RCA 100p/m. RPDA fills via collats from dLAD. RPL3 fills via collats from OM3; b. 01/2022 ETT: Ex time 6:34. 22mm horizontal ST depression in V5, V6.  Recommend nuclear stress test.   Gout    History of echocardiogram    a. 11/2021 Echo: EF 55-60%, no rwma, nl RV fxn, triv MR.   Hypertension 12/26/2005   Morbid obesity ([place])    Plantar fascia rupture 08/29/2010   Tobacco use    a. Smokes 1 cigar daily.    Past Surgical History:  Procedure Laterality Date   CORONARY STENT INTERVENTION N/A 12/19/2021   Procedure: CORONARY STENT INTERVENTION;  Surgeon: Wellington Hampshire, MD;  Location: [facility];  Service: Cardiovascular;  Laterality: N/A;   KNEE ARTHROSCOPY     Left and right acl    LEFT HEART  CATH AND CORONARY ANGIOGRAPHY N/A 12/19/2021   Procedure: LEFT HEART CATH AND CORONARY ANGIOGRAPHY;  Surgeon: Iran Ouch, MD;  Location: [facility];  Service: Cardiovascular;  Laterality: N/A;   ORIF ELBOW FRACTURE     left   ORIF FEMUR FRACTURE     left   REPLACEMENT TOTAL KNEE     left   SHOULDER SURGERY  2003    Current Medications: Current Meds  Medication Sig   allopurinol (ZYLOPRIM) 100 MG tablet Take 1 tablet (100 mg total) by mouth daily.   aspirin EC 81 MG tablet Take 1  tablet (81 mg total) by mouth daily. Swallow whole.   atorvastatin (LIPITOR) 80 MG tablet Take 1 tablet (80 mg total) by mouth daily.   CALCIUM-MAGNESIUM-ZINC PO Take 3 tablets by mouth daily.   carvedilol (COREG) 3.125 MG tablet Take 1 tablet (3.125 mg total) by mouth 2 (two) times daily with a meal.   Cholecalciferol (VITAMIN D3) 125 MCG (5000 UT) CAPS Take 1 capsule by mouth daily.   colchicine 0.6 MG tablet Take one tablet twice daily for 7 days, then take one tablet daily   loratadine (CLARITIN) 10 MG tablet Take 10 mg by mouth daily.   losartan (COZAAR) 25 MG tablet Take 1 tablet (25 mg total) by mouth daily.   prasugrel (EFFIENT) 10 MG TABS tablet Take 1 tablet (10 mg total) by mouth daily.    Allergies:   Patient has no known allergies.   ROS:   12-point review of systems is negative unless otherwise noted in the HPI.   Recent Labs: 12/16/2021: ALT 35 12/19/2021: Magnesium 1.8 12/26/2021: BUN 20; Creatinine, Ser 1.03; Hemoglobin 15.4; Platelets 344; Potassium 4.4; Sodium 135  Recent Lipid Panel    Component Value Date/Time   CHOL 208 (H) 12/18/2021 0114   TRIG 181 (H) 12/18/2021 0114   HDL 34 (L) 12/18/2021 0114   CHOLHDL 6.1 12/18/2021 0114   VLDL 36 12/18/2021 0114   LDLCALC 138 (H) 12/18/2021 0114   LDLDIRECT 114.0 12/16/2021 0946    PHYSICAL EXAM:    VS:  BP 132/82 (BP Location: Left Arm, Patient Position: Sitting, Cuff Size: Large)   Pulse 73   Ht 5\' 9"  (1.753 m)   Wt 254 lb 6.4 oz (115.4 kg)   SpO2 98%   BMI 37.57 kg/m   BMI: Body mass index is 37.57 kg/m.  Physical Exam  Wt Readings from Last 3 Encounters:  02/25/22 254 lb 6.4 oz (115.4 kg)  02/12/22 252 lb 2 oz (114.4 kg)  02/10/22 254 lb (115.2 kg)     ASSESSMENT & PLAN:   CAD: Status post non-STEMI in November 2023 with drug-eluting stent placement x 2 to the circumflex.  He has done well without chest pain and has been tolerating cardiac rehabilitation well.  As part of DOT physical, an ETT was  performed earlier this week with equivocal, 1 mm ST segment depression in leads V5 and V6.  Recommendation was made for nuclear stress testing.  Discussed with patient today and he is agreeable.  He remains on aspirin, statin, beta-blocker, ARB, and prasugrel therapy.  HTN: Blood pressure reasonably controlled on beta-blocker and ARB therapy.  HLD: LDL 138 in 11/2021 with goal being less than 55.  Unfortunately he had a piece of pizza for lunch today and is not going to be able to have fasting labs.  Will obtain fasting labs when he comes back for his nuclear stress test.  Disposition: Follow-up exercise  Myoview.  Follow-up lipids and LFTs.  Follow-up in clinic in 6 months or sooner if necessary.   Shared Decision Making/Informed Consent The risks [chest pain, shortness of breath, cardiac arrhythmias, dizziness, blood pressure fluctuations, myocardial infarction, stroke/transient ischemic attack, nausea, vomiting, allergic reaction, radiation exposure, metallic taste sensation and life-threatening complications (estimated to be 1 in 10,000)], benefits (risk stratification, diagnosing coronary artery disease, treatment guidance) and alternatives of a nuclear stress test were discussed in detail with Eric Huff and he agrees to proceed.    Medication Adjustments/Labs and Tests Ordered: Current medicines are reviewed at length with the patient today.  Concerns regarding medicines are outlined above. Medication changes, Labs and Tests ordered today are summarized above and listed in the Patient Instructions accessible in Encounters.   Signed, Murray Hodgkins, NP 02/25/2022 5:18 PM     [address] [place], [place] [address] [phone]

## 2022-02-24 ENCOUNTER — Ambulatory Visit: Payer: BC Managed Care – PPO | Attending: Physician Assistant

## 2022-02-24 DIAGNOSIS — I214 Non-ST elevation (NSTEMI) myocardial infarction: Secondary | ICD-10-CM | POA: Diagnosis not present

## 2022-02-24 LAB — EXERCISE TOLERANCE TEST
Angina Index: 0
Estimated workload: 7.8
Exercise duration (min): 6 min
Exercise duration (sec): 34 s
MPHR: 167 {beats}/min
Peak HR: 146 {beats}/min
Percent HR: 87 %
RPE: 15
Rest HR: 67 {beats}/min

## 2022-02-25 ENCOUNTER — Ambulatory Visit: Payer: BC Managed Care – PPO | Attending: Physician Assistant | Admitting: Nurse Practitioner

## 2022-02-25 ENCOUNTER — Encounter: Payer: Self-pay | Admitting: Nurse Practitioner

## 2022-02-25 ENCOUNTER — Encounter: Payer: BC Managed Care – PPO | Admitting: *Deleted

## 2022-02-25 VITALS — BP 132/82 | HR 73 | Ht 69.0 in | Wt 254.4 lb

## 2022-02-25 DIAGNOSIS — Z955 Presence of coronary angioplasty implant and graft: Secondary | ICD-10-CM | POA: Diagnosis not present

## 2022-02-25 DIAGNOSIS — E785 Hyperlipidemia, unspecified: Secondary | ICD-10-CM | POA: Diagnosis not present

## 2022-02-25 DIAGNOSIS — R079 Chest pain, unspecified: Secondary | ICD-10-CM | POA: Diagnosis not present

## 2022-02-25 DIAGNOSIS — I214 Non-ST elevation (NSTEMI) myocardial infarction: Secondary | ICD-10-CM | POA: Diagnosis not present

## 2022-02-25 DIAGNOSIS — I10 Essential (primary) hypertension: Secondary | ICD-10-CM | POA: Diagnosis not present

## 2022-02-25 DIAGNOSIS — I2510 Atherosclerotic heart disease of native coronary artery without angina pectoris: Secondary | ICD-10-CM | POA: Diagnosis not present

## 2022-02-25 NOTE — Progress Notes (Signed)
Daily Session Note  Patient Details  Name: Eric Huff MRN: [id number] Date of Birth: 09-13-1970 Referring Provider:   Flowsheet Row Cardiac Rehab from 01/05/2022 in [facility] Cardiac and Pulmonary Rehab  Referring Provider Fletcher Anon       Encounter Date: 02/25/2022  Check In:  Session Check In - 02/25/22 0953       Check-In   Supervising physician immediately available to respond to emergencies See telemetry face sheet for immediately available ER MD    Location [facility]-Cardiac & Pulmonary Rehab    Staff Present Darlyne Russian, RN, ADN;Meredith Sherryll Burger, RN BSN;Susanne Bice, RN, BSN, CCRP;Noah Tickle, BS, Exercise Physiologist    Virtual Visit No    Medication changes reported     No    Fall or balance concerns reported    No    Warm-up and Cool-down Performed on first and last piece of equipment    Resistance Training Performed Yes    VAD Patient? No    PAD/SET Patient? No      Pain Assessment   Currently in Pain? No/denies                Social History   Tobacco Use  Smoking Status Former   Types: Cigarettes, Cigars   Quit date: 12/19/2021   Years since quitting: 0.1  Smokeless Tobacco Former  Tobacco Comments   Quit cigarettes in 2009.  Quit cigars in November 2023    Goals Met:  Independence with exercise equipment Exercise tolerated well No report of concerns or symptoms today Strength training completed today  Goals Unmet:  Not Applicable  Comments: Pt able to follow exercise prescription today without complaint.  Will continue to monitor for progression.    Dr. Emily Filbert is Medical Director for [place].  Dr. Ottie Glazier is Medical Director for [facility] Pulmonary Rehabilitation.

## 2022-02-25 NOTE — Patient Instructions (Addendum)
Medication Instructions:  Your physician recommends that you continue on your current medications as directed. Please refer to the Current Medication list given to you today.  *If you need a refill on your cardiac medications before your next appointment, please call your pharmacy*   Lab Work: Your physician recommends that you return for lab work: Lipid panel & LFTs  [place] [facility] 1st desk on the right to check in (REGISTRATION)  Lab hours: Monday- Friday (7:30 am- 5:30 pm)   If you have labs (blood work) drawn today and your tests are completely normal, you will receive your results only by: MyChart Message (if you have MyChart) OR A paper copy in the mail If you have any lab test that is abnormal or we need to change your treatment, we will call you to review the results.   Testing/Procedures: Your provider has ordered a exercise tolerance test. This test will evaluate the blood supply to your heart muscle during periods of exercise and rest. For this test, you will raise your heart rate by walking on a treadmill at different levels.   you may eat a light breakfast/ lunch prior to your procedure no caffeine for 24 hours prior to your test (coffee, tea, soft drinks, or chocolate)  no smoking/ vaping for 4 hours prior to your test you may take your regular medications the day of your test bring any inhalers with you to your test wear comfortable clothing & tennis/ non-skid shoes to walk on the treadmill  This will take place at [place] [address], [place]    Follow-Up: At [facility], you and your health needs are our priority.  As part of our continuing mission to provide you with exceptional heart care, we have created designated Provider Care Teams.  These Care Teams include your primary Cardiologist (physician) and Advanced Practice Providers (APPs -  Physician Assistants and Nurse Practitioners) who all work  together to provide you with the care you need, when you need it.  We recommend signing up for the patient portal called "MyChart".  Sign up information is provided on this After Visit Summary.  MyChart is used to connect with patients for Virtual Visits (Telemedicine).  Patients are able to view lab/test results, encounter notes, upcoming appointments, etc.  Non-urgent messages can be sent to your provider as well.   To learn more about what you can do with MyChart, go to [email or web address].    Your next appointment:   3 month(s)  Provider:   You may see Kathlyn Sacramento, MD or one of the following Advanced Practice Providers on your designated Care Team:    Christell Faith, PA-C   Other Instructions -None

## 2022-02-27 ENCOUNTER — Encounter
Admission: RE | Admit: 2022-02-27 | Discharge: 2022-02-27 | Disposition: A | Discharge status: Home / Self Care | Payer: BC Managed Care – PPO | Source: Ambulatory Visit | Attending: Nurse Practitioner | Admitting: Nurse Practitioner

## 2022-02-27 ENCOUNTER — Other Ambulatory Visit
Admission: RE | Admit: 2022-02-27 | Discharge: 2022-02-27 | Disposition: A | Discharge status: Home / Self Care | Payer: BC Managed Care – PPO | Source: Ambulatory Visit | Attending: Nurse Practitioner | Admitting: Nurse Practitioner

## 2022-02-27 DIAGNOSIS — I2510 Atherosclerotic heart disease of native coronary artery without angina pectoris: Secondary | ICD-10-CM

## 2022-02-27 DIAGNOSIS — R079 Chest pain, unspecified: Secondary | ICD-10-CM

## 2022-02-27 LAB — NM MYOCAR MULTI W/SPECT W/WALL MOTION / EF
Angina Index: 0
Estimated workload: 9.3
Exercise duration (min): 7 min
Exercise duration (sec): 30 s
LV dias vol: 93 mL (ref 62–150)
LV sys vol: 36 mL
Nuc Stress EF: 61 %
Peak HR: 150 {beats}/min
Percent HR: 89 %
Rest HR: 54 {beats}/min
Rest Nuclear Isotope Dose: 10.9 mCi
SDS: 7
SRS: 0
SSS: 2
Stress Nuclear Isotope Dose: 29.9 mCi
TID: 0.62

## 2022-02-27 LAB — HEPATIC FUNCTION PANEL
ALT: 149 U/L — ABNORMAL HIGH (ref 0–44)
AST: 72 U/L — ABNORMAL HIGH (ref 15–41)
Albumin: 4 g/dL (ref 3.5–5.0)
Alkaline Phosphatase: 90 U/L (ref 38–126)
Bilirubin, Direct: 0.2 mg/dL (ref 0.0–0.2)
Indirect Bilirubin: 1.1 mg/dL — ABNORMAL HIGH (ref 0.3–0.9)
Total Bilirubin: 1.3 mg/dL — ABNORMAL HIGH (ref 0.3–1.2)
Total Protein: 7.1 g/dL (ref 6.5–8.1)

## 2022-02-27 LAB — LIPID PANEL
Cholesterol: 127 mg/dL (ref 0–200)
HDL: 34 mg/dL — ABNORMAL LOW (ref >40)
LDL Cholesterol: 68 mg/dL (ref 0–99)
Total CHOL/HDL Ratio: 3.7 RATIO
Triglycerides: 127 mg/dL (ref <150)
VLDL: 25 mg/dL (ref 0–40)

## 2022-02-27 MED ORDER — TECHNETIUM TC 99M TETROFOSMIN IV KIT
29.9100 | PACK | Freq: Once | INTRAVENOUS | Status: AC | PRN
  Administered 2022-02-27: 29.91 via INTRAVENOUS

## 2022-02-27 MED ORDER — TECHNETIUM TC 99M TETROFOSMIN IV KIT
10.9200 | PACK | Freq: Once | INTRAVENOUS | Status: AC | PRN
  Administered 2022-02-27: 10.92 via INTRAVENOUS

## 2022-03-02 ENCOUNTER — Other Ambulatory Visit: Payer: Self-pay | Admitting: *Deleted

## 2022-03-02 DIAGNOSIS — I2510 Atherosclerotic heart disease of native coronary artery without angina pectoris: Secondary | ICD-10-CM

## 2022-03-02 DIAGNOSIS — E785 Hyperlipidemia, unspecified: Secondary | ICD-10-CM

## 2022-03-03 ENCOUNTER — Encounter: Payer: BC Managed Care – PPO | Attending: Cardiovascular Disease | Admitting: *Deleted

## 2022-03-03 DIAGNOSIS — I214 Non-ST elevation (NSTEMI) myocardial infarction: Secondary | ICD-10-CM | POA: Diagnosis not present

## 2022-03-03 DIAGNOSIS — Z955 Presence of coronary angioplasty implant and graft: Secondary | ICD-10-CM | POA: Diagnosis not present

## 2022-03-03 NOTE — Progress Notes (Signed)
Daily Session Note  Patient Details  Name: Eric Huff MRN: [id number] Date of Birth: 1970/06/06 Referring Provider:   Flowsheet Row Cardiac Rehab from 01/05/2022 in [facility] Cardiac and Pulmonary Rehab  Referring Provider Fletcher Anon       Encounter Date: 03/03/2022  Check In:  Session Check In - 03/03/22 1034       Check-In   Supervising physician immediately available to respond to emergencies See telemetry face sheet for immediately available ER MD    Location [facility]-Cardiac & Pulmonary Rehab    Staff Present Heath Lark, RN, BSN, CCRP;Jessica [place], MA, RCEP, CCRP, Bertram Gala, MS, ACSM CEP, Exercise Physiologist    Virtual Visit No    Medication changes reported     No    Fall or balance concerns reported    No    Warm-up and Cool-down Performed on first and last piece of equipment    Resistance Training Performed Yes    VAD Patient? No    PAD/SET Patient? No      Pain Assessment   Currently in Pain? No/denies                Social History   Tobacco Use  Smoking Status Former   Types: Cigarettes, Cigars   Quit date: 12/19/2021   Years since quitting: 0.2  Smokeless Tobacco Former  Tobacco Comments   Quit cigarettes in 2009.  Quit cigars in November 2023    Goals Met:  Independence with exercise equipment Exercise tolerated well No report of concerns or symptoms today  Goals Unmet:  Not Applicable  Comments: Pt able to follow exercise prescription today without complaint.  Will continue to monitor for progression.    Dr. Emily Filbert is Medical Director for [place].  Dr. Ottie Glazier is Medical Director for [facility] Pulmonary Rehabilitation.

## 2022-03-05 ENCOUNTER — Encounter: Payer: BC Managed Care – PPO | Admitting: *Deleted

## 2022-03-05 DIAGNOSIS — Z955 Presence of coronary angioplasty implant and graft: Secondary | ICD-10-CM

## 2022-03-05 DIAGNOSIS — I214 Non-ST elevation (NSTEMI) myocardial infarction: Secondary | ICD-10-CM | POA: Diagnosis not present

## 2022-03-05 NOTE — Progress Notes (Signed)
Daily Session Note  Patient Details  Name: Eric Huff MRN: [id number] Date of Birth: 1970/12/18 Referring Provider:   Flowsheet Row Cardiac Rehab from 01/05/2022 in [facility] Cardiac and Pulmonary Rehab  Referring Provider Fletcher Anon       Encounter Date: 03/05/2022  Check In:  Session Check In - 03/05/22 1002       Check-In   Supervising physician immediately available to respond to emergencies See telemetry face sheet for immediately available ER MD    Location [facility] & Pulmonary Rehab    Staff Present Darlyne Russian, RN, Lorin Mercy, MS, ACSM CEP, Exercise Physiologist;Noah Tickle, BS, Exercise Physiologist    Virtual Visit No    Medication changes reported     No    Fall or balance concerns reported    No    Warm-up and Cool-down Performed on first and last piece of equipment    Resistance Training Performed Yes    VAD Patient? No    PAD/SET Patient? No      Pain Assessment   Currently in Pain? No/denies                Social History   Tobacco Use  Smoking Status Former   Types: Cigarettes, Cigars   Quit date: 12/19/2021   Years since quitting: 0.2  Smokeless Tobacco Former  Tobacco Comments   Quit cigarettes in 2009.  Quit cigars in November 2023    Goals Met:  Independence with exercise equipment Exercise tolerated well No report of concerns or symptoms today Strength training completed today  Goals Unmet:  Not Applicable  Comments: Pt able to follow exercise prescription today without complaint.  Will continue to monitor for progression.    Dr. Emily Filbert is Medical Director for [place].  Dr. Ottie Glazier is Medical Director for [facility] Pulmonary Rehabilitation.

## 2022-03-10 ENCOUNTER — Encounter: Payer: BC Managed Care – PPO | Admitting: *Deleted

## 2022-03-10 DIAGNOSIS — Z955 Presence of coronary angioplasty implant and graft: Secondary | ICD-10-CM

## 2022-03-10 DIAGNOSIS — I214 Non-ST elevation (NSTEMI) myocardial infarction: Secondary | ICD-10-CM | POA: Diagnosis not present

## 2022-03-10 NOTE — Progress Notes (Signed)
Daily Session Note  Patient Details  Name: Eric Huff MRN: [id number] Date of Birth: 08-09-1970 Referring Provider:   Flowsheet Row Cardiac Rehab from 01/05/2022 in [facility] Cardiac and Pulmonary Rehab  Referring Provider Fletcher Anon       Encounter Date: 03/10/2022  Check In:  Session Check In - 03/10/22 1043       Check-In   Supervising physician immediately available to respond to emergencies See telemetry face sheet for immediately available ER MD    Location [facility] Pulmonary Rehab    Staff Present Heath Lark, RN, BSN, CCRP;Jessica [place], MA, RCEP, CCRP, Bertram Gala, MS, ACSM CEP, Exercise Physiologist    Virtual Visit No    Medication changes reported     No    Fall or balance concerns reported    No    Warm-up and Cool-down Performed on first and last piece of equipment    Resistance Training Performed Yes    VAD Patient? No    PAD/SET Patient? No      Pain Assessment   Currently in Pain? No/denies                Social History   Tobacco Use  Smoking Status Former   Types: Cigarettes, Cigars   Quit date: 12/19/2021   Years since quitting: 0.2  Smokeless Tobacco Former  Tobacco Comments   Quit cigarettes in 2009.  Quit cigars in November 2023    Goals Met:  Independence with exercise equipment Exercise tolerated well No report of concerns or symptoms today  Goals Unmet:  Not Applicable  Comments: Pt able to follow exercise prescription today without complaint.  Will continue to monitor for progression.    Dr. Emily Filbert is Medical Director for [place].  Dr. Ottie Glazier is Medical Director for [facility] Pulmonary Rehabilitation.

## 2022-03-12 ENCOUNTER — Encounter: Payer: BC Managed Care – PPO | Admitting: *Deleted

## 2022-03-12 DIAGNOSIS — Z955 Presence of coronary angioplasty implant and graft: Secondary | ICD-10-CM

## 2022-03-12 DIAGNOSIS — I214 Non-ST elevation (NSTEMI) myocardial infarction: Secondary | ICD-10-CM | POA: Diagnosis not present

## 2022-03-12 NOTE — Progress Notes (Signed)
Daily Session Note  Patient Details  Name: Eric Huff MRN: [id number] Date of Birth: 15-Jan-1971 Referring Provider:   Flowsheet Row Cardiac Rehab from 01/05/2022 in [facility] Cardiac and Pulmonary Rehab  Referring Provider Fletcher Anon       Encounter Date: 03/12/2022  Check In:  Session Check In - 03/12/22 1423       Check-In   Supervising physician immediately available to respond to emergencies See telemetry face sheet for immediately available ER MD    Location [facility]-Cardiac & Pulmonary Rehab    Staff Present Darlyne Russian, RN, ADN;Jessica Luan Pulling, MA, RCEP, CCRP, CCET;Merit [place], [place]    Virtual Visit No    Medication changes reported     No    Fall or balance concerns reported    No    Warm-up and Cool-down Performed on first and last piece of equipment    Resistance Training Performed Yes    VAD Patient? No    PAD/SET Patient? No      Pain Assessment   Currently in Pain? No/denies                Social History   Tobacco Use  Smoking Status Former   Types: Cigarettes, Cigars   Quit date: 12/19/2021   Years since quitting: 0.2  Smokeless Tobacco Former  Tobacco Comments   Quit cigarettes in 2009.  Quit cigars in November 2023    Goals Met:  Independence with exercise equipment Exercise tolerated well No report of concerns or symptoms today Strength training completed today  Goals Unmet:  Not Applicable  Comments: Pt able to follow exercise prescription today without complaint.  Will continue to monitor for progression.    Dr. Emily Filbert is Medical Director for [place].  Dr. Ottie Glazier is Medical Director for [facility] Pulmonary Rehabilitation.

## 2022-03-17 ENCOUNTER — Encounter: Payer: BC Managed Care – PPO | Admitting: *Deleted

## 2022-03-17 DIAGNOSIS — Z955 Presence of coronary angioplasty implant and graft: Secondary | ICD-10-CM | POA: Diagnosis not present

## 2022-03-17 DIAGNOSIS — I214 Non-ST elevation (NSTEMI) myocardial infarction: Secondary | ICD-10-CM | POA: Diagnosis not present

## 2022-03-17 NOTE — Progress Notes (Signed)
Daily Session Note  Patient Details  Name: Eric Huff MRN: [id number] Date of Birth: January 04, 1971 Referring Provider:   Flowsheet Row Cardiac Rehab from 01/05/2022 in [facility] Cardiac and Pulmonary Rehab  Referring Provider Fletcher Anon       Encounter Date: 03/17/2022  Check In:  Session Check In - 03/17/22 1139       Check-In   Supervising physician immediately available to respond to emergencies See telemetry face sheet for immediately available ER MD    Location [facility]-Cardiac & Pulmonary Rehab    Staff Present Nyoka Cowden, RN, BSN, Melina Schools, MS, ACSM CEP, Exercise Physiologist;Noah Tickle, BS, Exercise Physiologist;Briar Sword Luan Pulling, MA, RCEP, CCRP, CCET    Virtual Visit No    Fall or balance concerns reported    No    Tobacco Cessation No Change    Warm-up and Cool-down Performed on first and last piece of equipment    Resistance Training Performed No    VAD Patient? No    PAD/SET Patient? No      Pain Assessment   Currently in Pain? No/denies    Multiple Pain Sites No                Social History   Tobacco Use  Smoking Status Former   Types: Cigarettes, Cigars   Quit date: 12/19/2021   Years since quitting: 0.2  Smokeless Tobacco Former  Tobacco Comments   Quit cigarettes in 2009.  Quit cigars in November 2023    Goals Met:  Independence with exercise equipment Exercise tolerated well No report of concerns or symptoms today  Goals Unmet:  Not Applicable  Comments: Pt able to follow exercise prescription today without complaint.  Will continue to monitor for progression.    Dr. Emily Filbert is Medical Director for [place].  Dr. Ottie Glazier is Medical Director for [facility] Pulmonary Rehabilitation.

## 2022-03-18 ENCOUNTER — Encounter: Payer: Self-pay | Admitting: *Deleted

## 2022-03-18 DIAGNOSIS — Z955 Presence of coronary angioplasty implant and graft: Secondary | ICD-10-CM

## 2022-03-18 DIAGNOSIS — I214 Non-ST elevation (NSTEMI) myocardial infarction: Secondary | ICD-10-CM

## 2022-03-18 NOTE — Progress Notes (Signed)
Cardiac Individual Treatment Plan  Patient Details  Name: Eric Huff MRN: [id number] Date of Birth: 13-Jul-1970 Referring Provider:   Flowsheet Row Cardiac Rehab from 01/05/2022 in [facility] Cardiac and Pulmonary Rehab  Referring Provider Arida       Initial Encounter Date:  Flowsheet Row Cardiac Rehab from 01/05/2022 in [facility] Cardiac and Pulmonary Rehab  Date 01/05/22       Visit Diagnosis: NSTEMI (non-ST elevated myocardial infarction) [facility])  Status post coronary artery stent placement  Patient's Home Medications on Admission:  Current Outpatient Medications:    allopurinol (ZYLOPRIM) 100 MG tablet, Take 1 tablet (100 mg total) by mouth daily., Disp: 30 tablet, Rfl: 6   aspirin EC 81 MG tablet, Take 1 tablet (81 mg total) by mouth daily. Swallow whole., Disp: , Rfl:    atorvastatin (LIPITOR) 80 MG tablet, Take 1 tablet (80 mg total) by mouth daily., Disp: 90 tablet, Rfl: 3   CALCIUM-MAGNESIUM-ZINC PO, Take 3 tablets by mouth daily., Disp: , Rfl:    carvedilol (COREG) 3.125 MG tablet, Take 1 tablet (3.125 mg total) by mouth 2 (two) times daily with a meal., Disp: 180 tablet, Rfl: 3   Cholecalciferol (VITAMIN D3) 125 MCG (5000 UT) CAPS, Take 1 capsule by mouth daily., Disp: , Rfl:    colchicine 0.6 MG tablet, Take one tablet twice daily for 7 days, then take one tablet daily, Disp: 37 tablet, Rfl: 0   loratadine (CLARITIN) 10 MG tablet, Take 10 mg by mouth daily., Disp: , Rfl:    losartan (COZAAR) 25 MG tablet, Take 1 tablet (25 mg total) by mouth daily., Disp: 90 tablet, Rfl: 3   methylPREDNISolone (MEDROL DOSEPAK) 4 MG TBPK tablet, Take 6 tablets the first day, 5 tablets the next day, 4 tablets the next, 3 tablets the next, 2 tablets the next and then the last tablet for a total of 6 days. (Patient not taking: Reported on 02/25/2022), Disp: 21 tablet, Rfl: 0   prasugrel (EFFIENT) 10 MG TABS tablet, Take 1 tablet (10 mg total) by mouth daily., Disp: 30 tablet, Rfl: 11  Past  Medical History: Past Medical History:  Diagnosis Date   CAD (coronary artery disease)    a. 11/2021 NSTEMI/PCI: LAD 30/67m RI min irregs, LCX 95p/m (3.0x15 Onyx Frontier DES), 862m (2.5x22 Onyx Frontier DES), RCA 100p/m. RPDA fills via collats from dLAD. RPL3 fills via collats from OM3; b. 01/2022 ETT: Ex time 6:34. 64m45morizontal ST depression in V5, V6.  Recommend nuclear stress test.   Gout    History of echocardiogram    a. 11/2021 Echo: EF 55-60%, no rwma, nl RV fxn, triv MR.   Hypertension 12/26/2005   Morbid obesity ([facility]  Plantar fascia rupture 08/29/2010   Tobacco use    a. Smokes 1 cigar daily.    Tobacco Use: Social History   Tobacco Use  Smoking Status Former   Types: Cigarettes, Cigars   Quit date: 12/19/2021   Years since quitting: 0.2  Smokeless Tobacco Former  Tobacco Comments   Quit cigarettes in 2009.  Quit cigars in November 2023    Labs: Review Flowsheet  More data exists      Latest Ref Rng & Units 12/15/2019 12/17/2020 12/16/2021 12/18/2021 02/27/2022  Labs for ITP Cardiac and Pulmonary Rehab  Cholestrol 0 - 200 mg/dL 169  192  194  208  127   LDL (calc) 0 - 99 mg/dL 106  126  - 138  68   Direct LDL mg/dL - -  114.0  - -  HDL-C >40 mg/dL 42.10  38.00  36.20  34  34   Trlycerides <150 mg/dL 107.0  140.0  215.0  181  127   Hemoglobin A1c 4.6 - 6.5 % 5.7  5.9  5.9  - -     Exercise Target Goals: Exercise Program Goal: Individual exercise prescription set using results from initial 6 min walk test and THRR while considering  patient's activity barriers and safety.   Exercise Prescription Goal: Initial exercise prescription builds to 30-45 minutes a day of aerobic activity, 2-3 days per week.  Home exercise guidelines will be given to patient during program as part of exercise prescription that the participant will acknowledge.   Education: Aerobic Exercise: - Group verbal and visual presentation on the components of exercise prescription.  Introduces F.I.T.T principle from ACSM for exercise prescriptions.  Reviews F.I.T.T. principles of aerobic exercise including progression. Written material given at graduation. Flowsheet Row Cardiac Rehab from 02/25/2022 in [facility] Cardiac and Pulmonary Rehab  Date 02/11/22  Educator [facility]  Instruction Review Code 1- Verbalizes Understanding       Education: Resistance Exercise: - Group verbal and visual presentation on the components of exercise prescription. Introduces F.I.T.T principle from ACSM for exercise prescriptions  Reviews F.I.T.T. principles of resistance exercise including progression. Written material given at graduation. Flowsheet Row Cardiac Rehab from 02/25/2022 in [facility] Cardiac and Pulmonary Rehab  Date 02/18/22  Educator [facility]  Instruction Review Code 1- Verbalizes Understanding        Education: Exercise & Equipment Safety: - Individual verbal instruction and demonstration of equipment use and safety with use of the equipment. Flowsheet Row Cardiac Rehab from 02/25/2022 in [facility] Cardiac and Pulmonary Rehab  Date 01/05/22  Educator [facility]  Instruction Review Code 1- Verbalizes Understanding       Education: Exercise Physiology & General Exercise Guidelines: - Group verbal and written instruction with models to review the exercise physiology of the cardiovascular system and associated critical values. Provides general exercise guidelines with specific guidelines to those with heart or lung disease.  Flowsheet Row Cardiac Rehab from 02/25/2022 in [facility] Cardiac and Pulmonary Rehab  Education need identified 01/05/22  Date 02/04/22  Educator [facility]  Instruction Review Code 1- [employer] Understanding       Education: Flexibility, Balance, Mind/Body Relaxation: - Group verbal and visual presentation with interactive activity on the components of exercise prescription. Introduces F.I.T.T principle from ACSM for exercise prescriptions. Reviews F.I.T.T. principles of flexibility and  balance exercise training including progression. Also discusses the mind body connection.  Reviews various relaxation techniques to help reduce and manage stress (i.e. Deep breathing, progressive muscle relaxation, and visualization). Balance handout provided to take home. Written material given at graduation. Flowsheet Row Cardiac Rehab from 02/25/2022 in [facility] Cardiac and Pulmonary Rehab  Date 02/25/22  Educator [facility]  Instruction Review Code 1- Verbalizes Understanding       Activity Barriers & Risk Stratification:  Activity Barriers & Cardiac Risk Stratification - 01/05/22 1506       Activity Barriers & Cardiac Risk Stratification   Activity Barriers Left Knee Replacement;Right Knee Replacement    Cardiac Risk Stratification Moderate             6 Minute Walk:  6 Minute Walk     Row Name 01/05/22 1505         6 Minute Walk   Phase Initial     Distance 1615 feet     Walk Time 6 minutes     #  of Rest Breaks 0     MPH 3.06     METS 3.52     RPE 8     Perceived Dyspnea  0     VO2 Peak 12.32     Symptoms No     Resting HR 56 bpm     Resting BP 122/84     Resting Oxygen Saturation  98 %     Exercise Oxygen Saturation  during 6 min walk 98 %     Max Ex. HR 101 bpm     Max Ex. BP 158/80     2 Minute Post BP 124/62              Oxygen Initial Assessment:   Oxygen Re-Evaluation:   Oxygen Discharge (Final Oxygen Re-Evaluation):   Initial Exercise Prescription:  Initial Exercise Prescription - 01/05/22 1500       Date of Initial Exercise RX and Referring Provider   Date 01/05/22    Referring Provider Arida      Oxygen   Maintain Oxygen Saturation 88% or higher      Treadmill   MPH 3    Grade 1    Minutes 15    METs 3.71      Recumbant Bike   Level 3    RPM 50    Minutes 15    METs 3.52      Elliptical   Level 1    Speed 3    Minutes 15    METs 3.52      REL-XR   Level 3    Speed 50    Minutes 15    METs 3.52      Biostep-RELP    Level 3    SPM 50    Minutes 15    METs 3.52      Track   Laps 42    Minutes 15    METs 3.28      Prescription Details   Frequency (times per week) 3    Duration Progress to 30 minutes of continuous aerobic without signs/symptoms of physical distress      Intensity   THRR 40-80% of Max Heartrate 100-144    Ratings of Perceived Exertion 11-13    Perceived Dyspnea 0-4      Progression   Progression Continue to progress workloads to maintain intensity without signs/symptoms of physical distress.      Resistance Training   Training Prescription Yes    Weight 5    Reps 10-15             Perform Capillary Blood Glucose checks as needed.  Exercise Prescription Changes:   Exercise Prescription Changes     Row Name 01/05/22 1500 01/22/22 1700 02/04/22 0700 02/05/22 1400 02/18/22 1200     Response to Exercise   Blood Pressure (Admit) 122/84 122/70 -- 124/76 128/88   Blood Pressure (Exercise) 158/80 164/68 -- 182/76 122/60   Blood Pressure (Exit) 124/62 118/76 -- 102/62 110/80   Heart Rate (Admit) 56 bpm 70 bpm -- 65 bpm 70 bpm   Heart Rate (Exercise) 101 bpm 125 bpm -- 127 bpm 122 bpm   Heart Rate (Exit) 55 bpm 77 bpm -- 87 bpm 89 bpm   Oxygen Saturation (Admit) 98 % -- -- -- --   Oxygen Saturation (Exercise) 98 % -- -- -- --   Oxygen Saturation (Exit) 98 % -- -- -- --   Rating of Perceived Exertion (Exercise) 8 14 -- 14  16   Perceived Dyspnea (Exercise) 0 -- -- -- --   Symptoms none none -- none none   Comments 6 MWT results 4th full day of exercise -- -- --   Duration -- Continue with 30 min of aerobic exercise without signs/symptoms of physical distress. -- Continue with 30 min of aerobic exercise without signs/symptoms of physical distress. Continue with 30 min of aerobic exercise without signs/symptoms of physical distress.   Intensity -- THRR unchanged -- THRR unchanged THRR unchanged     Progression   Progression -- Continue to progress workloads to maintain  intensity without signs/symptoms of physical distress. -- Continue to progress workloads to maintain intensity without signs/symptoms of physical distress. Continue to progress workloads to maintain intensity without signs/symptoms of physical distress.   Average METs -- 4.39 -- 6.04 5.11     Resistance Training   Training Prescription -- Yes -- Yes Yes   Weight -- 5 lb -- 5 lb 8 lb   Reps -- 10-15 -- 10-15 10-15     Interval Training   Interval Training -- No -- No Yes   Equipment -- -- -- -- Treadmill   Comments -- -- -- -- Incline interval 1.5-8%     Treadmill   MPH -- 3.5 -- 3.2 3.5   Grade -- 2 -- 3.5 3.5  Up to 8% with intervals   Minutes -- 15 -- 15 15   METs -- 4.64 -- 4.99 5.37     Recumbant Bike   Level -- 7 -- -- 8   Watts -- 78 -- -- 73   Minutes -- 15 -- -- 15   METs -- 4.15 -- -- 4.29     Elliptical   Level -- 1 -- 5 3   Speed -- -- -- 3.3 3.8   Minutes -- 15 -- 15 15   METs -- 4.1 -- 4.77 5.1     REL-XR   Level -- 8 -- 8 8   Minutes -- 15 -- 15 15   METs -- 6.5 -- 9.4 --     Biostep-RELP   Level -- 3 -- -- --   Minutes -- 15 -- -- --   METs -- 3 -- -- --     Home Exercise Plan   Plans to continue exercise at -- -- Home (comment)  walking, gym at work Home (comment)  walking, gym at work Home (comment)  walking, gym at work   Frequency -- -- Add 2 additional days to program exercise sessions. Add 2 additional days to program exercise sessions. Add 2 additional days to program exercise sessions.   Initial Home Exercises Provided -- -- 02/04/22 02/04/22 02/04/22     Oxygen   Maintain Oxygen Saturation -- 88% or higher -- 88% or higher 88% or higher    Row Name 03/04/22 1600             Response to Exercise   Blood Pressure (Admit) 132/70       Blood Pressure (Exit) 112/74       Heart Rate (Admit) 64 bpm       Heart Rate (Exercise) 126 bpm       Heart Rate (Exit) 76 bpm       Rating of Perceived Exertion (Exercise) 15       Symptoms none        Duration Continue with 30 min of aerobic exercise without signs/symptoms of physical distress.       Intensity  THRR unchanged         Progression   Progression Continue to progress workloads to maintain intensity without signs/symptoms of physical distress.       Average METs 4.83         Resistance Training   Training Prescription Yes       Weight 8 lb       Reps 10-15         Interval Training   Interval Training Yes       Equipment Treadmill       Comments Incline interval 2-8%         Treadmill   MPH 3.5       Grade 2  Up to 8% with intervals       Minutes 15       METs 4.65         Elliptical   Level 3       Speed 4.6       Minutes 15       METs 4.6         REL-XR   Level 5       Minutes 15       METs 5.6         Home Exercise Plan   Plans to continue exercise at Home (comment)  walking, gym at work       Frequency Add 2 additional days to program exercise sessions.       Initial Home Exercises Provided 02/04/22         Oxygen   Maintain Oxygen Saturation 88% or higher                Exercise Comments:   Exercise Comments     Row Name 01/07/22 1058           Exercise Comments First full day of exercise!  Patient was oriented to gym and equipment including functions, settings, policies, and procedures.  Patient's individual exercise prescription and treatment plan were reviewed.  All starting workloads were established based on the results of the 6 minute walk test done at initial orientation visit.  The plan for exercise progression was also introduced and progression will be customized based on patient's performance and goals.                Exercise Goals and Review:   Exercise Goals     Row Name 01/05/22 1513             Exercise Goals   Increase Physical Activity Yes       Intervention Provide advice, education, support and counseling about physical activity/exercise needs.;Develop an individualized exercise prescription for  aerobic and resistive training based on initial evaluation findings, risk stratification, comorbidities and participant's personal goals.       Expected Outcomes Short Term: Attend rehab on a regular basis to increase amount of physical activity.;Long Term: Add in home exercise to make exercise part of routine and to increase amount of physical activity.;Long Term: Exercising regularly at least 3-5 days a week.       Increase Strength and Stamina Yes       Intervention Provide advice, education, support and counseling about physical activity/exercise needs.;Develop an individualized exercise prescription for aerobic and resistive training based on initial evaluation findings, risk stratification, comorbidities and participant's personal goals.       Expected Outcomes Short Term: Increase workloads from initial exercise prescription for resistance, speed, and METs.;Short Term: Perform resistance  training exercises routinely during rehab and add in resistance training at home;Long Term: Improve cardiorespiratory fitness, muscular endurance and strength as measured by increased METs and functional capacity (6MWT)       Able to understand and use rate of perceived exertion (RPE) scale Yes       Intervention Provide education and explanation on how to use RPE scale       Expected Outcomes Short Term: Able to use RPE daily in rehab to express subjective intensity level;Long Term:  Able to use RPE to guide intensity level when exercising independently       Able to understand and use Dyspnea scale Yes       Intervention Provide education and explanation on how to use Dyspnea scale       Expected Outcomes Short Term: Able to use Dyspnea scale daily in rehab to express subjective sense of shortness of breath during exertion;Long Term: Able to use Dyspnea scale to guide intensity level when exercising independently       Knowledge and understanding of Target Heart Rate Range (THRR) Yes       Intervention Provide  education and explanation of THRR including how the numbers were predicted and where they are located for reference       Expected Outcomes Short Term: Able to state/look up THRR;Long Term: Able to use THRR to govern intensity when exercising independently;Short Term: Able to use daily as guideline for intensity in rehab       Able to check pulse independently Yes       Intervention Provide education and demonstration on how to check pulse in carotid and radial arteries.;Review the importance of being able to check your own pulse for safety during independent exercise       Expected Outcomes Short Term: Able to explain why pulse checking is important during independent exercise;Long Term: Able to check pulse independently and accurately       Understanding of Exercise Prescription Yes       Intervention Provide education, explanation, and written materials on patient's individual exercise prescription       Expected Outcomes Short Term: Able to explain program exercise prescription;Long Term: Able to explain home exercise prescription to exercise independently                Exercise Goals Re-Evaluation :  Exercise Goals Re-Evaluation     Row Name 01/07/22 1058 01/22/22 1706 02/04/22 0752 02/05/22 1433 02/18/22 1302     Exercise Goal Re-Evaluation   Exercise Goals Review Able to understand and use rate of perceived exertion (RPE) scale;Able to understand and use Dyspnea scale;Knowledge and understanding of Target Heart Rate Range (THRR);Understanding of Exercise Prescription Increase Physical Activity;Increase Strength and Stamina;Understanding of Exercise Prescription Increase Physical Activity;Increase Strength and Stamina;Understanding of Exercise Prescription;Able to understand and use rate of perceived exertion (RPE) scale;Knowledge and understanding of Target Heart Rate Range (THRR);Able to understand and use Dyspnea scale;Able to check pulse independently Increase Physical  Activity;Increase Strength and Stamina;Understanding of Exercise Prescription Increase Physical Activity;Increase Strength and Stamina;Understanding of Exercise Prescription   Comments Reviewed RPE scale, THR and program prescription with pt today.  Pt voiced understanding and was given a copy of goals to take home. Eric Huff is doing very well for the first couple of sessions he has been at rehab. He has significanly increased most of his workloads already. Recumbent bike increased to level 7 working at 78 watts! He also increased to a 3.2 mph/3% incline. He worked over  6.5 METs on level on the XR as well! All RPEs are appropriate. We will continue to monitor as he progresses. Eric Huff is doing well in rehab.  He is trying to improve his stamina and pushing himself more each day.  He was asking about his THR today.  Reviewed home exercise with pt today.  Pt plans to walk and use gym at work for exercise.  Reviewed THR, pulse, RPE, sign and symptoms, pulse oximetery and when to call 911 or MD.  Also discussed weather considerations and indoor options.  Pt voiced understanding. Eric Huff continues to do well in rehab. He recently increased his overall average MET level to 6.04 METs. He also was able to improve to level 5 on the elliptical at a speed of 3.3 mph. He also increased his workload on the treadmill to a speed of 3.2 mph and an incline of 3.5%. We will continue to monitor his progress in the program. Eric Huff contiunues to do well in rehab. His speed and METs on the elliptical increased overall. He started doing intervals with the treadmill varying his incline up to 8%! He also increased to level 8 on the recumbent bike. We will continue to monitor.   Expected Outcomes Short: Use RPE daily to regulate intensity. Long: Follow program prescription in THR. Short: Continue to follow current exercise prescription Long: Build up overall strength and stamina Short: Start getting to gym on regular basis Long: continue to improve  stamina Short: Continue to increase workloads. Long: Continue to improve strength and stamina. 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  He passed his stress test and had an abnormal EKG with some elevation.  They did an echo and saw there is some residual damage when they did an echo.  He got winded but no chest pain.  He is into a routine of walking at home.  He is hoping to maintain while returning to work. Eric Huff is doing well in the program. He has continued to do well with intervals of incline on the treadmill ranging form 2-8% with a speed of 3.5 mph. He also has done well on the elliptical at level 3. He continues to use 8 lb hand weights for resistance training as well. We will continue to monitor his progress in the program.      Expected Outcomes Short; Continue to exercise despite returning to work Long: Conitnue to improve stamina Short: Continue to do intervals on the treadmill. Long: Continue to improve strength and stamina.               Discharge Exercise Prescription (Final Exercise Prescription Changes):  Exercise Prescription Changes - 03/04/22 1600       Response to Exercise   Blood Pressure (Admit) 132/70    Blood Pressure (Exit) 112/74    Heart Rate (Admit) 64 bpm    Heart Rate (Exercise) 126 bpm    Heart Rate (Exit) 76 bpm    Rating of Perceived Exertion (Exercise) 15    Symptoms none    Duration Continue with 30 min of aerobic exercise without signs/symptoms of physical distress.  Intensity THRR unchanged      Progression   Progression Continue to progress  workloads to maintain intensity without signs/symptoms of physical distress.    Average METs 4.83      Resistance Training   Training Prescription Yes    Weight 8 lb    Reps 10-15      Interval Training   Interval Training Yes    Equipment Treadmill    Comments Incline interval 2-8%      Treadmill   MPH 3.5    Grade 2   Up to 8% with intervals   Minutes 15    METs 4.65      Elliptical   Level 3    Speed 4.6    Minutes 15    METs 4.6      REL-XR   Level 5    Minutes 15    METs 5.6      Home Exercise Plan   Plans to continue exercise at Home (comment)   walking, gym at work   Frequency Add 2 additional days to program exercise sessions.    Initial Home Exercises Provided 02/04/22      Oxygen   Maintain Oxygen Saturation 88% or higher             Nutrition:  Target Goals: Understanding of nutrition guidelines, daily intake of sodium <1567m, cholesterol <2046m calories 30% from fat and 7% or less from saturated fats, daily to have 5 or more servings of fruits and vegetables.  Education: All About Nutrition: -Group instruction provided by verbal, written material, interactive activities, discussions, models, and posters to present general guidelines for heart healthy nutrition including fat, fiber, MyPlate, the role of sodium in heart healthy nutrition, utilization of the nutrition label, and utilization of this knowledge for meal planning. Follow up email sent as well. Written material given at graduation. Flowsheet [facility]  Education need identified 01/05/22       Biometrics:  Pre Biometrics - 01/05/22 1514       Pre Biometrics   Height 5' 9.5" (1.765 m)    Weight 252 lb 9.6 oz (114.6 kg)    Waist Circumference 49.5 inches    Hip Circumference 48 inches    Waist to Hip Ratio 1.03 %    BMI (Calculated) 36.78    Single Leg Stand 30 seconds              Nutrition Therapy Plan and  Nutrition Goals:  Nutrition Therapy & Goals - 01/05/22 1426       Nutrition Therapy   Diet Heart healthy, low Na    Drug/Food Interactions Statins/Certain Fruits    Protein (specify units) 85-95g    Fiber 30 grams    Whole Grain Foods 3 servings    Saturated Fats 16 max. grams    Fruits and Vegetables 8 servings/day    Sodium 2 grams      Personal Nutrition Goals   Nutrition Goal ST: review paperwork, practice MyPlate guidelines LT: follow MyPlate guidelines, limit saturated fat <16g, limit Na <2g    Comments [age].o. M admitted to cardiac rehab s/p NSTEMI. PMHx includes HTN, prediabetes, HLD, gout, CAD, vit D deficiency. Relevant medications includes lipitor, calcium/magnesium/zinc, vit D3. 3x/week will eat at work; he works at [employer] have donated many canned items higher in sodium and have been cutting back on sodium in general. they have switched to honey-wheat bread and now  having oatmeal with berries. He was eating 2 meals and is now eating 3 meals per day. Discussed heart healthy eating and gout friendly eating.      Intervention Plan   Intervention Prescribe, educate and counsel regarding individualized specific dietary modifications aiming towards targeted core components such as weight, hypertension, lipid management, diabetes, heart failure and other comorbidities.;Nutrition handout(s) given to patient.    Expected Outcomes Short Term Goal: Understand basic principles of dietary content, such as calories, fat, sodium, cholesterol and nutrients.;Short Term Goal: A plan has been developed with personal nutrition goals set during dietitian appointment.;Long Term Goal: Adherence to prescribed nutrition plan.             Nutrition Assessments:  MEDIFICTS Score Key: ?70 Need to make dietary changes  40-70 Heart Healthy Diet ? 40 Therapeutic Level Cholesterol Diet  Flowsheet Row Cardiac Rehab from 01/05/2022 in [facility] Cardiac and Pulmonary Rehab  Picture Your Plate Total  Score on Admission 45      Picture Your Plate Scores: [id number] Unhealthy dietary pattern with much room for improvement. 41-50 Dietary pattern unlikely to meet recommendations for good health and room for improvement. 51-60 More healthful dietary pattern, with some room for improvement.  >60 Healthy dietary pattern, although there may be some specific behaviors that could be improved.    Nutrition Goals Re-Evaluation:  Nutrition Goals Re-Evaluation     [place] Name 02/04/22 0757 03/03/22 1000           Goals   Nutrition Goal ST: review paperwork, practice MyPlate guidelines LT: follow MyPlate guidelines, limit saturated fat <16g, limit Na <2g Short: continue to watch sodium Long: continue to work on portion control      Comment Eric Huff is doing well in rehab.  He is doing well with his diet.  He is trying to use the MyPlate guidelines and watch his salt and sugar.  He is doing well with getting in his fruits and vegetables as well.  Eric Huff has been working on cutting back on portion and eating more frequently versus big meals Eric Huff is doing well in rehab.  He says he eats what he doesn't like.  Said that if it taste good it can't  be good for him.  His wife is really staying on him about his eating. He had a hamburger at a cook out and she jumped all over him.  We talked about using moderation as key  to diet.  He is watching his sodium.  He is doing better with portion control and he has done away with his midnight snacks.      Expected Outcome Short: continue to watch sodium Long: continue to work on portion control Short: Conitnue to work on portion control Long: Conitnue to make healthier choices.               Nutrition Goals Discharge (Final Nutrition Goals Re-Evaluation):  Nutrition Goals Re-Evaluation - 03/03/22 1000       Goals   Nutrition Goal Short: continue to watch sodium Long: continue to work on portion control    Comment Eric Huff is doing well in rehab.  He says he eats what he doesn't  like.  Said that if it taste good it can't  be good for him.  His wife is really staying on him about his eating. He had a hamburger at a cook out and she jumped all over him.  We talked about using moderation as key  to diet.  He is watching  his sodium.  He is doing better with portion control and he has done away with his midnight snacks.    Expected Outcome Short: Conitnue to work on portion control Long: Conitnue to make healthier choices.             Psychosocial: Target Goals: Acknowledge presence or absence of significant depression and/or stress, maximize coping skills, provide positive support system. Participant is able to verbalize types and ability to use techniques and skills needed for reducing stress and depression.   Education: Stress, Anxiety, and Depression - Group verbal and visual presentation to define topics covered.  Reviews how body is impacted by stress, anxiety, and depression.  Also discusses healthy ways to reduce stress and to treat/manage anxiety and depression.  Written material given at graduation.   Education: Sleep Hygiene -Provides group verbal and written instruction about how sleep can affect your health.  Define sleep hygiene, discuss sleep cycles and impact of sleep habits. Review good sleep hygiene tips.    Initial Review & Psychosocial Screening:  Initial Psych Review & Screening - 12/30/21 1307       Initial Review   Current issues with None Identified      Family Dynamics   Good Support System? Yes   wife, family     Barriers   Psychosocial barriers to participate in program There are no identifiable barriers or psychosocial needs.;The patient should benefit from training in stress management and relaxation.      Screening Interventions   Interventions Encouraged to exercise;Provide feedback about the scores to participant;To provide support and resources with identified psychosocial needs    Expected Outcomes Short Term goal: Utilizing  psychosocial counselor, staff and physician to assist with identification of specific Stressors or current issues interfering with healing process. Setting desired goal for each stressor or current issue identified.;Short Term goal: Identification and review with participant of any Quality of Life or Depression concerns found by scoring the questionnaire.;Long Term Goal: Stressors or current issues are controlled or eliminated.;Long Term goal: The participant improves quality of Life and PHQ9 Scores as seen by post scores and/or verbalization of changes             Quality of Life Scores:   Quality of Life - 01/05/22 1516       Quality of Life   Select Quality of Life      Quality of Life Scores   Health/Function Pre 28 %    Socioeconomic Pre 27.19 %    Psych/Spiritual Pre 30 %    Family Pre 30 %    GLOBAL Pre 28.5 %            Scores of 19 and below usually indicate a poorer quality of life in these areas.  A difference of  2-3 points is a clinically meaningful difference.  A difference of 2-3 points in the total score of the Quality of Life Index has been associated with significant improvement in overall quality of life, self-image, physical symptoms, and general health in studies assessing change in quality of life.  PHQ-9: Review Flowsheet  More data exists      02/10/2022 01/05/2022 12/26/2021 12/24/2020 12/20/2019  Depression screen PHQ 2/9  Decreased Interest 0 0 0 0 0  Down, Depressed, Hopeless 0 0 0 0 0  PHQ - 2 Score 0 0 0 0 0  Altered sleeping - 0 - - -  Tired, decreased energy - 0 - - -  Change in appetite - 0 - - -  Feeling bad or failure about yourself  - 0 - - -  Moving slowly or fidgety/restless - 0 - - -  Suicidal thoughts - 0 - - -  PHQ-9 Score - 0 - - -  Difficult doing work/chores - Not difficult at all - - -   Interpretation of Total Score  Total Score Depression Severity:  1-4 = Minimal depression, 5-9 = Mild depression, 10-14 = Moderate  depression, 15-19 = Moderately severe depression, 20-27 = Severe depression   Psychosocial Evaluation and Intervention:  Psychosocial Evaluation - 12/30/21 1313       Psychosocial Evaluation & Interventions   Interventions Encouraged to exercise with the program and follow exercise prescription    Comments Eric Huff 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 He reports feeling well and wanting to increase his activity level. He thinks he will be out of work for 2 months. 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 He reports no stress concerns. His wife and family are very supportive. He is looking forward to learning more about the nutritional aspect of heart healthy living as that is his and his wife's main topic of discussion    Expected Outcomes Short: attend cardiac rehab for education and exercise. Long: develop and maintain positive self care habits.    Continue Psychosocial Services  Follow up required by staff             Psychosocial Re-Evaluation:  Psychosocial Re-Evaluation     Row Name 02/04/22 0754 03/03/22 0956           Psychosocial Re-Evaluation   Current issues with Current Stress Concerns Current Stress Concerns      Comments Eric Huff is doing well in rehab.  He goes back to work on February 1.  He tries not to let too many things get to him.  He does have a stress test scheduled for 1/30 prior to returning to work.  Once he gets back, he will need to change his schedule up some.  He does not that he has an occasional pain that doesn't last long.  He does want to finish the program.  Overall, he is staying positive. He also sleeps well for most part. Eric Huff is doing well in rehab.  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  He returns to work on Monday.  He switched classes to help with return to work.  He has an interview for a new position tomorrow which is day shift.  He continues to sleep well currently.       Expected Outcomes short: continue to prepare to return to work Long: Continue to stay positive Short: Return to work Long: conitnue to exercise despite returning back to work.      Interventions Encouraged to attend Cardiac Rehabilitation for the exercise Encouraged to attend Cardiac Rehabilitation for the exercise      Continue Psychosocial Services  Follow up required by staff Follow up required by staff               Psychosocial Discharge (Final Psychosocial Re-Evaluation):  Psychosocial Re-Evaluation - 03/03/22 0956       Psychosocial Re-Evaluation   Current issues with Current Stress Concerns    Comments Eric Huff is doing well in rehab.  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  He returns to work on Monday.  He switched classes to help with return to work.  He has an interview for a new position tomorrow which is day shift.  He continues to sleep well currently.    Expected Outcomes Short: Return to work Long: conitnue to exercise despite returning back to work.    Interventions Encouraged to attend Cardiac Rehabilitation for the exercise    Continue Psychosocial Services  Follow up required by staff             Vocational Rehabilitation: Provide vocational rehab assistance to qualifying candidates.   Vocational Rehab Evaluation & Intervention:  Vocational Rehab - 12/30/21 1307       Initial Vocational Rehab Evaluation & Intervention   Assessment shows need for Vocational Rehabilitation No             Education: Education Goals: Education classes will be provided on a variety of topics geared toward better understanding of heart health and risk factor modification. Participant will state understanding/return demonstration of topics presented as noted by education test scores.  Learning Barriers/Preferences:  Learning Barriers/Preferences - 12/30/21 1307       Learning Barriers/Preferences   Learning Barriers None    Learning Preferences None              General Cardiac Education Topics:  AED/CPR: - Group verbal and written instruction with the use of models to demonstrate the basic use of the AED with the basic ABC's of resuscitation.   Anatomy and Cardiac Procedures: - Group verbal and visual presentation and models provide information about basic cardiac anatomy and function. Reviews the testing methods done to diagnose heart disease and the outcomes of the test results. Describes the treatment choices: Medical Management, Angioplasty, or Coronary Bypass Surgery for treating various heart conditions including Myocardial Infarction, Angina, Valve Disease, and Cardiac Arrhythmias.  Written material given at graduation.   Medication Safety: - Group verbal and visual instruction to review commonly prescribed medications for heart and lung disease. Reviews the medication, class of the drug, and side effects. Includes the steps to properly store meds and maintain the prescription regimen.  Written material given at graduation. Flowsheet Row Cardiac Rehab from 02/25/2022 in [facility] Cardiac and Pulmonary Rehab  Date 01/07/22  Educator SB  Instruction Review Code 1- Verbalizes Understanding       Intimacy: - Group verbal instruction through game format to discuss how heart and lung disease can affect sexual intimacy. Written material given at graduation.. Flowsheet Row Cardiac Rehab from 02/25/2022 in [facility] Cardiac and Pulmonary Rehab  Date 02/11/22  Educator [facility]  Instruction Review Code 1- Verbalizes Understanding       Know Your Numbers and Heart Failure: - Group verbal and visual instruction to discuss disease risk factors for cardiac and pulmonary disease and treatment options.  Reviews associated critical values for Overweight/Obesity, Hypertension, Cholesterol, and Diabetes.  Discusses basics of heart failure: signs/symptoms and treatments.  Introduces Heart Failure Zone chart for action plan for heart failure.  Written  material given at graduation. Flowsheet Row Cardiac Rehab from 02/25/2022 in [facility] Cardiac and Pulmonary Rehab  Date 01/14/22  Educator SB  Instruction Review Code 1- Verbalizes Understanding       Infection Prevention: - Provides verbal and written material to individual with discussion of infection control including proper hand washing and proper equipment cleaning during exercise session. Flowsheet Row Cardiac Rehab from 02/25/2022 in [facility] Cardiac and Pulmonary Rehab  Date 01/05/22  Educator [facility]  Instruction Review Code 1- Verbalizes Understanding       Falls Prevention: - Provides verbal and  written material to individual with discussion of falls prevention and safety. Flowsheet Row Cardiac Rehab from 02/25/2022 in [facility] Cardiac and Pulmonary Rehab  Date 01/05/22  Educator [facility]  Instruction Review Code 1- Verbalizes Understanding       Other: -Provides group and verbal instruction on various topics (see comments)   Knowledge Questionnaire Score:  Knowledge Questionnaire Score - 01/05/22 1517       Knowledge Questionnaire Score   Pre Score 24/26             Core Components/Risk Factors/Patient Goals at Admission:  Personal Goals and Risk Factors at Admission - 01/05/22 1517       Core Components/Risk Factors/Patient Goals on Admission    Weight Management Yes;Weight Loss    Intervention Weight Management: Develop a combined nutrition and exercise program designed to reach desired caloric intake, while maintaining appropriate intake of nutrient and fiber, sodium and fats, and appropriate energy expenditure required for the weight goal.;Weight Management: Provide education and appropriate resources to help participant work on and attain dietary goals.;Weight Management/Obesity: Establish reasonable short term and long term weight goals.;Obesity: Provide education and appropriate resources to help participant work on and attain dietary goals.    Admit Weight 252 lb 9.6 oz  (114.6 kg)    Goal Weight: Short Term 245 lb (111.1 kg)    Goal Weight: Long Term 200 lb (90.7 kg)    Expected Outcomes Short Term: Continue to assess and modify interventions until short term weight is achieved;Long Term: Adherence to nutrition and physical activity/exercise program aimed toward attainment of established weight goal;Weight Loss: Understanding of general recommendations for a balanced deficit meal plan, which promotes 1-2 lb weight loss per week and includes a negative energy balance of [phone] kcal/d;Understanding recommendations for meals to include 15-35% energy as protein, 25-35% energy from fat, 35-60% energy from carbohydrates, less than 245m of dietary cholesterol, 20-35 gm of total fiber daily;Understanding of distribution of calorie intake throughout the day with the consumption of 4-5 meals/snacks    Hypertension Yes    Intervention Provide education on lifestyle modifcations including regular physical activity/exercise, weight management, moderate sodium restriction and increased consumption of fresh fruit, vegetables, and low fat dairy, alcohol moderation, and smoking cessation.;Monitor prescription use compliance.    Expected Outcomes Short Term: Continued assessment and intervention until BP is < 140/952mHG in hypertensive participants. < 130/8084mG in hypertensive participants with diabetes, heart failure or chronic kidney disease.;Long Term: Maintenance of blood pressure at goal levels.    Lipids Yes    Intervention Provide education and support for participant on nutrition & aerobic/resistive exercise along with prescribed medications to achieve LDL <50m57mDL >40mg75m Expected Outcomes Short Term: Participant states understanding of desired cholesterol values and is compliant with medications prescribed. Participant is following exercise prescription and nutrition guidelines.;Long Term: Cholesterol controlled with medications as prescribed, with individualized  exercise RX and with personalized nutrition plan. Value goals: LDL < 50mg,22m > 40 mg.             Education:Diabetes - Individual verbal and written instruction to review signs/symptoms of diabetes, desired ranges of glucose level fasting, after meals and with exercise. Acknowledge that pre and post exercise glucose checks will be done for 3 sessions at entry of program.   Core Components/Risk Factors/Patient Goals Review:   Goals and Risk Factor Review     Row Name 02/04/22 0759 03/03/22 1002           Core  Components/Risk Factors/Patient Goals Review   Personal Goals Review Weight Management/Obesity;Hypertension Weight Management/Obesity;Hypertension      Review Eric Huff is doing well in rehab.   He is starting to lose his weight.  He can now tie his shoes and get to his feet easier.  His pessures are doing well and he checks them at least once a day in the morning at home.  He is still trying to fight off cold symptoms. Eric Huff is doing well.  His stress echo showed his EF was good and he passed to go back to work.  He did have some elevation, but blood is moving through just not a lot.  He is feeling good without chest pain, just different EKG.  His weight is staying steady. He really wants to lose weight but has not been able to break 240 mark but he is just going to keep going again.  His pressures are doing well. He is doing well with his medicaitons as well.      Expected Outcomes Short; Continue to work on weight loss lOng: Continue to monitor risk factors Short: Continue to work on weight loss through plateau Long: conitnue to montior risk factors               Core Components/Risk Factors/Patient Goals at Discharge (Final Review):   Goals and Risk Factor Review - 03/03/22 1002       Core Components/Risk Factors/Patient Goals Review   Personal Goals Review Weight Management/Obesity;Hypertension    Review Eric Huff is doing well.  His stress echo showed his EF was good and he passed  to go back to work.  He did have some elevation, but blood is moving through just not a lot.  He is feeling good without chest pain, just different EKG.  His weight is staying steady. He really wants to lose weight but has not been able to break 240 mark but he is just going to keep going again.  His pressures are doing well. He is doing well with his medicaitons as well.    Expected Outcomes Short: Continue to work on weight loss through plateau Long: conitnue to montior risk factors             ITP Comments:  ITP Comments     Row Name 12/30/21 1320 01/05/22 1505 01/07/22 1058 01/21/22 1010 02/18/22 0918   ITP Comments Initial telephone orientation completed. Diagnosis can be found in [facility] 11/22. EP orientation scheduled for Monday 12/11 at 1:30. Completed 6MWT and gym orientation. Initial ITP created and sent for review to Dr. Emily Filbert, Medical Director. First full day of exercise!  Patient was oriented to gym and equipment including functions, settings, policies, and procedures.  Patient's individual exercise prescription and treatment plan were reviewed.  All starting workloads were established based on the results of the 6 minute walk test done at initial orientation visit.  The plan for exercise progression was also introduced and progression will be customized based on patient's performance and goals. 30 Day review completed. Medical Director ITP review done, changes made as directed, and signed approval by Medical Director.     new to program 30 Day review completed. Medical Director ITP review done, changes made as directed, and signed approval by Medical Director.    [place] Name 03/18/22 1240           ITP Comments 30 day review completed. ITP sent to Dr. Emily Filbert, Medical Director of Cardiac Rehab. Continue with ITP unless changes are  made by physician.                Comments: 30 day review

## 2022-03-19 ENCOUNTER — Encounter: Payer: BC Managed Care – PPO | Admitting: *Deleted

## 2022-03-24 ENCOUNTER — Encounter: Payer: BC Managed Care – PPO | Admitting: *Deleted

## 2022-03-24 DIAGNOSIS — I214 Non-ST elevation (NSTEMI) myocardial infarction: Secondary | ICD-10-CM

## 2022-03-24 DIAGNOSIS — Z955 Presence of coronary angioplasty implant and graft: Secondary | ICD-10-CM | POA: Diagnosis not present

## 2022-03-24 NOTE — Progress Notes (Signed)
Daily Session Note  Patient Details  Name: Eric Huff MRN: [id number] Date of Birth: [birth year] Referring Provider:   Flowsheet Row Cardiac Rehab from 01/05/2022 in [facility] Cardiac and Pulmonary Rehab  Referring Provider Fletcher Anon       Encounter Date: 03/24/2022  Check In:  Session Check In - 03/24/22 1059       Check-In   Supervising physician immediately available to respond to emergencies See telemetry face sheet for immediately available ER MD    Location [facility]-Cardiac & Pulmonary Rehab    Staff Present Renita Papa, RN BSN;Jessica Luan Pulling, MA, RCEP, CCRP, Bertram Gala, MS, ACSM CEP, Exercise Physiologist    Virtual Visit No    Medication changes reported     No    Fall or balance concerns reported    No    Warm-up and Cool-down Performed on first and last piece of equipment    Resistance Training Performed Yes    VAD Patient? No    PAD/SET Patient? No      Pain Assessment   Currently in Pain? No/denies                Social History   Tobacco Use  Smoking Status Former   Types: Cigarettes, Cigars   Quit date: 12/19/2021   Years since quitting: 0.2  Smokeless Tobacco Former  Tobacco Comments   Quit cigarettes in 2009.  Quit cigars in November 2023    Goals Met:  Independence with exercise equipment Exercise tolerated well No report of concerns or symptoms today Strength training completed today  Goals Unmet:  Not Applicable  Comments: Pt able to follow exercise prescription today without complaint.  Will continue to monitor for progression.    Dr. Emily Filbert is Medical Director for [place].  Dr. Ottie Glazier is Medical Director for [facility] Pulmonary Rehabilitation.

## 2022-03-26 ENCOUNTER — Encounter: Payer: BC Managed Care – PPO | Admitting: *Deleted

## 2022-03-26 VITALS — Ht 69.5 in | Wt 248.7 lb

## 2022-03-26 DIAGNOSIS — Z955 Presence of coronary angioplasty implant and graft: Secondary | ICD-10-CM

## 2022-03-26 DIAGNOSIS — I214 Non-ST elevation (NSTEMI) myocardial infarction: Secondary | ICD-10-CM

## 2022-03-26 NOTE — Progress Notes (Signed)
Daily Session Note  Patient Details  Name: EMERY DEKAM MRN: [id number] Date of Birth: 08/26/1970 Referring Provider:   Flowsheet Row Cardiac Rehab from 01/05/2022 in [facility]  Referring Provider Fletcher Anon       Encounter Date: 03/26/2022  Check In:  Session Check In - 03/26/22 1349       Check-In   Supervising physician immediately available to respond to emergencies See telemetry face sheet for immediately available ER MD    Location [facility]-Cardiac & Pulmonary Rehab    Staff Present Darlyne Russian, RN, ADN;Jessica Luan Pulling, MA, RCEP, CCRP, CCET;Jamarien Richardson, [place]    Virtual Visit No    Medication changes reported     No    Fall or balance concerns reported    No    Warm-up and Cool-down Performed on first and last piece of equipment    Resistance Training Performed Yes    VAD Patient? No    PAD/SET Patient? No      Pain Assessment   Currently in Pain? No/denies                Social History   Tobacco Use  Smoking Status Former   Types: Cigarettes, Cigars   Quit date: 12/19/2021   Years since quitting: 0.2  Smokeless Tobacco Former  Tobacco Comments   Quit cigarettes in 2009.  Quit cigars in November 2023    Goals Met:  Independence with exercise equipment Exercise tolerated well No report of concerns or symptoms today Strength training completed today  Goals Unmet:  Not Applicable  Comments:  Laszlo graduated today from  rehab with 30 sessions completed.  Details of the patient's exercise prescription and what He needs to do in order to continue the prescription and progress were discussed with patient.  Patient was given a copy of prescription and goals.  Patient verbalized understanding.  Derry plans to continue to exercise by walking and the gym at his work.    Dr. Emily Filbert is Medical Director for [facility].  Dr. Ottie Glazier is Medical Director for [facility].

## 2022-03-26 NOTE — Progress Notes (Signed)
Cardiac Individual Treatment Plan  Patient Details  Name: Eric Huff MRN: [id number] Date of Birth: July 23, 1970 Referring Provider:   Flowsheet Row Cardiac Rehab from 01/05/2022 in [facility] Cardiac and Pulmonary Rehab  Referring Provider Arida       Initial Encounter Date:  Flowsheet Row Cardiac Rehab from 01/05/2022 in [facility] Cardiac and Pulmonary Rehab  Date 01/05/22       Visit Diagnosis: NSTEMI (non-ST elevated myocardial infarction) [facility])  Status post coronary artery stent placement  Patient's Home Medications on Admission:  Current Outpatient Medications:    allopurinol (ZYLOPRIM) 100 MG tablet, Take 1 tablet (100 mg total) by mouth daily., Disp: 30 tablet, Rfl: 6   aspirin EC 81 MG tablet, Take 1 tablet (81 mg total) by mouth daily. Swallow whole., Disp: , Rfl:    atorvastatin (LIPITOR) 80 MG tablet, Take 1 tablet (80 mg total) by mouth daily., Disp: 90 tablet, Rfl: 3   CALCIUM-MAGNESIUM-ZINC PO, Take 3 tablets by mouth daily., Disp: , Rfl:    carvedilol (COREG) 3.125 MG tablet, Take 1 tablet (3.125 mg total) by mouth 2 (two) times daily with a meal., Disp: 180 tablet, Rfl: 3   Cholecalciferol (VITAMIN D3) 125 MCG (5000 UT) CAPS, Take 1 capsule by mouth daily., Disp: , Rfl:    colchicine 0.6 MG tablet, Take one tablet twice daily for 7 days, then take one tablet daily, Disp: 37 tablet, Rfl: 0   loratadine (CLARITIN) 10 MG tablet, Take 10 mg by mouth daily., Disp: , Rfl:    losartan (COZAAR) 25 MG tablet, Take 1 tablet (25 mg total) by mouth daily., Disp: 90 tablet, Rfl: 3   methylPREDNISolone (MEDROL DOSEPAK) 4 MG TBPK tablet, Take 6 tablets the first day, 5 tablets the next day, 4 tablets the next, 3 tablets the next, 2 tablets the next and then the last tablet for a total of 6 days. (Patient not taking: Reported on 02/25/2022), Disp: 21 tablet, Rfl: 0   prasugrel (EFFIENT) 10 MG TABS tablet, Take 1 tablet (10 mg total) by mouth daily., Disp: 30 tablet, Rfl: 11  Past  Medical History: Past Medical History:  Diagnosis Date   CAD (coronary artery disease)    a. 11/2021 NSTEMI/PCI: LAD 30/32m RI min irregs, LCX 95p/m (3.0x15 Onyx Frontier DES), 815m (2.5x22 Onyx Frontier DES), RCA 100p/m. RPDA fills via collats from dLAD. RPL3 fills via collats from OM3; b. 01/2022 ETT: Ex time 6:34. 54m85morizontal ST depression in V5, V6.  Recommend nuclear stress test.   Gout    History of echocardiogram    a. 11/2021 Echo: EF 55-60%, no rwma, nl RV fxn, triv MR.   Hypertension 12/26/2005   Morbid obesity ([facility]ami  Plantar fascia rupture 08/29/2010   Tobacco use    a. Smokes 1 cigar daily.    Tobacco Use: Social History   Tobacco Use  Smoking Status Former   Types: Cigarettes, Cigars   Quit date: 12/19/2021   Years since quitting: 0.2  Smokeless Tobacco Former  Tobacco Comments   Quit cigarettes in 2009.  Quit cigars in November 2023    Labs: Review Flowsheet  More data exists      Latest Ref Rng & Units 12/15/2019 12/17/2020 12/16/2021 12/18/2021 02/27/2022  Labs for ITP Cardiac and Pulmonary Rehab  Cholestrol 0 - 200 mg/dL 169  192  194  208  127   LDL (calc) 0 - 99 mg/dL 106  126  - 138  68   Direct LDL mg/dL - -  114.0  - -  HDL-C >40 mg/dL 42.10  38.00  36.20  34  34   Trlycerides <150 mg/dL 107.0  140.0  215.0  181  127   Hemoglobin A1c 4.6 - 6.5 % 5.7  5.9  5.9  - -     Exercise Target Goals: Exercise Program Goal: Individual exercise prescription set using results from initial 6 min walk test and THRR while considering  patient's activity barriers and safety.   Exercise Prescription Goal: Initial exercise prescription builds to 30-45 minutes a day of aerobic activity, 2-3 days per week.  Home exercise guidelines will be given to patient during program as part of exercise prescription that the participant will acknowledge.   Education: Aerobic Exercise: - Group verbal and visual presentation on the components of exercise prescription.  Introduces F.I.T.T principle from ACSM for exercise prescriptions.  Reviews F.I.T.T. principles of aerobic exercise including progression. Written material given at graduation. Flowsheet Row Cardiac Rehab from 02/25/2022 in [facility] Cardiac and Pulmonary Rehab  Date 02/11/22  Educator [facility]  Instruction Review Code 1- Verbalizes Understanding       Education: Resistance Exercise: - Group verbal and visual presentation on the components of exercise prescription. Introduces F.I.T.T principle from ACSM for exercise prescriptions  Reviews F.I.T.T. principles of resistance exercise including progression. Written material given at graduation. Flowsheet Row Cardiac Rehab from 02/25/2022 in [facility] Cardiac and Pulmonary Rehab  Date 02/18/22  Educator [facility]  Instruction Review Code 1- Verbalizes Understanding        Education: Exercise & Equipment Safety: - Individual verbal instruction and demonstration of equipment use and safety with use of the equipment. Flowsheet Row Cardiac Rehab from 02/25/2022 in [facility] Cardiac and Pulmonary Rehab  Date 01/05/22  Educator [facility]  Instruction Review Code 1- Verbalizes Understanding       Education: Exercise Physiology & General Exercise Guidelines: - Group verbal and written instruction with models to review the exercise physiology of the cardiovascular system and associated critical values. Provides general exercise guidelines with specific guidelines to those with heart or lung disease.  Flowsheet Row Cardiac Rehab from 02/25/2022 in [facility] Cardiac and Pulmonary Rehab  Education need identified 01/05/22  Date 02/04/22  Educator [facility]  Instruction Review Code 1- [employer] Understanding       Education: Flexibility, Balance, Mind/Body Relaxation: - Group verbal and visual presentation with interactive activity on the components of exercise prescription. Introduces F.I.T.T principle from ACSM for exercise prescriptions. Reviews F.I.T.T. principles of flexibility and  balance exercise training including progression. Also discusses the mind body connection.  Reviews various relaxation techniques to help reduce and manage stress (i.e. Deep breathing, progressive muscle relaxation, and visualization). Balance handout provided to take home. Written material given at graduation. Flowsheet Row Cardiac Rehab from 02/25/2022 in [facility] Cardiac and Pulmonary Rehab  Date 02/25/22  Educator [facility]  Instruction Review Code 1- Verbalizes Understanding       Activity Barriers & Risk Stratification:  Activity Barriers & Cardiac Risk Stratification - 01/05/22 1506       Activity Barriers & Cardiac Risk Stratification   Activity Barriers Left Knee Replacement;Right Knee Replacement    Cardiac Risk Stratification Moderate             6 Minute Walk:  6 Minute Walk     Row Name 01/05/22 1505 03/26/22 1348       6 Minute Walk   Phase Initial Discharge    Distance 1615 feet 1700 feet    Distance % Change --  5.3 %    Distance Feet Change -- 85 ft    Walk Time 6 minutes 6 minutes    # of Rest Breaks 0 0    MPH 3.06 3.21    METS 3.52 4.05    RPE 8 13    Perceived Dyspnea  0 --    VO2 Peak 12.32 14.17    Symptoms No No    Resting HR 56 bpm 67 bpm    Resting BP 122/84 118/60    Resting Oxygen Saturation  98 % 97 %    Exercise Oxygen Saturation  during 6 min walk 98 % 97 %    Max Ex. HR 101 bpm 107 bpm    Max Ex. BP 158/80 126/62    2 Minute Post BP 124/62 --             Oxygen Initial Assessment:   Oxygen Re-Evaluation:   Oxygen Discharge (Final Oxygen Re-Evaluation):   Initial Exercise Prescription:  Initial Exercise Prescription - 01/05/22 1500       Date of Initial Exercise RX and Referring Provider   Date 01/05/22    Referring Provider Arida      Oxygen   Maintain Oxygen Saturation 88% or higher      Treadmill   MPH 3    Grade 1    Minutes 15    METs 3.71      Recumbant Bike   Level 3    RPM 50    Minutes 15    METs 3.52       Elliptical   Level 1    Speed 3    Minutes 15    METs 3.52      REL-XR   Level 3    Speed 50    Minutes 15    METs 3.52      Biostep-RELP   Level 3    SPM 50    Minutes 15    METs 3.52      Track   Laps 42    Minutes 15    METs 3.28      Prescription Details   Frequency (times per week) 3    Duration Progress to 30 minutes of continuous aerobic without signs/symptoms of physical distress      Intensity   THRR 40-80% of Max Heartrate 100-144    Ratings of Perceived Exertion 11-13    Perceived Dyspnea 0-4      Progression   Progression Continue to progress workloads to maintain intensity without signs/symptoms of physical distress.      Resistance Training   Training Prescription Yes    Weight 5    Reps 10-15             Perform Capillary Blood Glucose checks as needed.  Exercise Prescription Changes:   Exercise Prescription Changes     Row Name 01/05/22 1500 01/22/22 1700 02/04/22 0700 02/05/22 1400 02/18/22 1200     Response to Exercise   Blood Pressure (Admit) 122/84 122/70 -- 124/76 128/88   Blood Pressure (Exercise) 158/80 164/68 -- 182/76 122/60   Blood Pressure (Exit) 124/62 118/76 -- 102/62 110/80   Heart Rate (Admit) 56 bpm 70 bpm -- 65 bpm 70 bpm   Heart Rate (Exercise) 101 bpm 125 bpm -- 127 bpm 122 bpm   Heart Rate (Exit) 55 bpm 77 bpm -- 87 bpm 89 bpm   Oxygen Saturation (Admit) 98 % -- -- -- --   Oxygen Saturation (Exercise)  98 % -- -- -- --   Oxygen Saturation (Exit) 98 % -- -- -- --   Rating of Perceived Exertion (Exercise) 8 14 -- 14 16   Perceived Dyspnea (Exercise) 0 -- -- -- --   Symptoms none none -- none none   Comments 6 MWT results 4th full day of exercise -- -- --   Duration -- Continue with 30 min of aerobic exercise without signs/symptoms of physical distress. -- Continue with 30 min of aerobic exercise without signs/symptoms of physical distress. Continue with 30 min of aerobic exercise without signs/symptoms of physical  distress.   Intensity -- THRR unchanged -- THRR unchanged THRR unchanged     Progression   Progression -- Continue to progress workloads to maintain intensity without signs/symptoms of physical distress. -- Continue to progress workloads to maintain intensity without signs/symptoms of physical distress. Continue to progress workloads to maintain intensity without signs/symptoms of physical distress.   Average METs -- 4.39 -- 6.04 5.11     Resistance Training   Training Prescription -- Yes -- Yes Yes   Weight -- 5 lb -- 5 lb 8 lb   Reps -- 10-15 -- 10-15 10-15     Interval Training   Interval Training -- No -- No Yes   Equipment -- -- -- -- Treadmill   Comments -- -- -- -- Incline interval 1.5-8%     Treadmill   MPH -- 3.5 -- 3.2 3.5   Grade -- 2 -- 3.5 3.5  Up to 8% with intervals   Minutes -- 15 -- 15 15   METs -- 4.64 -- 4.99 5.37     Recumbant Bike   Level -- 7 -- -- 8   Watts -- 78 -- -- 73   Minutes -- 15 -- -- 15   METs -- 4.15 -- -- 4.29     Elliptical   Level -- 1 -- 5 3   Speed -- -- -- 3.3 3.8   Minutes -- 15 -- 15 15   METs -- 4.1 -- 4.77 5.1     REL-XR   Level -- 8 -- 8 8   Minutes -- 15 -- 15 15   METs -- 6.5 -- 9.4 --     Biostep-RELP   Level -- 3 -- -- --   Minutes -- 15 -- -- --   METs -- 3 -- -- --     Home Exercise Plan   Plans to continue exercise at -- -- Home (comment)  walking, gym at work Home (comment)  walking, gym at work Home (comment)  walking, gym at work   Frequency -- -- Add 2 additional days to program exercise sessions. Add 2 additional days to program exercise sessions. Add 2 additional days to program exercise sessions.   Initial Home Exercises Provided -- -- 02/04/22 02/04/22 02/04/22     Oxygen   Maintain Oxygen Saturation -- 88% or higher -- 88% or higher 88% or higher    Row Name 03/04/22 1600 03/18/22 1600           Response to Exercise   Blood Pressure (Admit) 132/70 124/62      Blood Pressure (Exit) 112/74 114/68       Heart Rate (Admit) 64 bpm 65 bpm      Heart Rate (Exercise) 126 bpm 111 bpm      Heart Rate (Exit) 76 bpm 85 bpm      Rating of Perceived Exertion (Exercise) 15 14  Symptoms none none      Duration Continue with 30 min of aerobic exercise without signs/symptoms of physical distress. Continue with 30 min of aerobic exercise without signs/symptoms of physical distress.      Intensity THRR unchanged THRR unchanged        Progression   Progression Continue to progress workloads to maintain intensity without signs/symptoms of physical distress. Continue to progress workloads to maintain intensity without signs/symptoms of physical distress.      Average METs 4.83 5.27        Resistance Training   Training Prescription Yes Yes      Weight 8 lb 10 lb      Reps 10-15 10-15        Interval Training   Interval Training Yes Yes      Equipment Treadmill Treadmill      Comments Incline interval 2-8% --        Treadmill   MPH 3.5 3.6      Grade 2  Up to 8% with intervals 5.5      Minutes 15 15      METs 4.65 6.49        Recumbant Bike   Level -- 3      Watts -- 36      Minutes -- 15      METs -- 4.02        Elliptical   Level 3 5      Speed 4.6 3      Minutes 15 15      METs 4.6 3.7        REL-XR   Level 5 --      Minutes 15 --      METs 5.6 --        Home Exercise Plan   Plans to continue exercise at Home (comment)  walking, gym at work Home (comment)  walking, gym at work      Frequency Add 2 additional days to program exercise sessions. Add 2 additional days to program exercise sessions.      Initial Home Exercises Provided 02/04/22 02/04/22        Oxygen   Maintain Oxygen Saturation 88% or higher 88% or higher               Exercise Comments:   Exercise Comments     Row Name 01/07/22 1058           Exercise Comments First full day of exercise!  Patient was oriented to gym and equipment including functions, settings, policies, and procedures.  Patient's  individual exercise prescription and treatment plan were reviewed.  All starting workloads were established based on the results of the 6 minute walk test done at initial orientation visit.  The plan for exercise progression was also introduced and progression will be customized based on patient's performance and goals.                Exercise Goals and Review:   Exercise Goals     Row Name 01/05/22 1513             Exercise Goals   Increase Physical Activity Yes       Intervention Provide advice, education, support and counseling about physical activity/exercise needs.;Develop an individualized exercise prescription for aerobic and resistive training based on initial evaluation findings, risk stratification, comorbidities and participant's personal goals.       Expected Outcomes Short Term: Attend rehab on a regular basis to  increase amount of physical activity.;Long Term: Add in home exercise to make exercise part of routine and to increase amount of physical activity.;Long Term: Exercising regularly at least 3-5 days a week.       Increase Strength and Stamina Yes       Intervention Provide advice, education, support and counseling about physical activity/exercise needs.;Develop an individualized exercise prescription for aerobic and resistive training based on initial evaluation findings, risk stratification, comorbidities and participant's personal goals.       Expected Outcomes Short Term: Increase workloads from initial exercise prescription for resistance, speed, and METs.;Short Term: Perform resistance training exercises routinely during rehab and add in resistance training at home;Long Term: Improve cardiorespiratory fitness, muscular endurance and strength as measured by increased METs and functional capacity (6MWT)       Able to understand and use rate of perceived exertion (RPE) scale Yes       Intervention Provide education and explanation on how to use RPE scale        Expected Outcomes Short Term: Able to use RPE daily in rehab to express subjective intensity level;Long Term:  Able to use RPE to guide intensity level when exercising independently       Able to understand and use Dyspnea scale Yes       Intervention Provide education and explanation on how to use Dyspnea scale       Expected Outcomes Short Term: Able to use Dyspnea scale daily in rehab to express subjective sense of shortness of breath during exertion;Long Term: Able to use Dyspnea scale to guide intensity level when exercising independently       Knowledge and understanding of Target Heart Rate Range (THRR) Yes       Intervention Provide education and explanation of THRR including how the numbers were predicted and where they are located for reference       Expected Outcomes Short Term: Able to state/look up THRR;Long Term: Able to use THRR to govern intensity when exercising independently;Short Term: Able to use daily as guideline for intensity in rehab       Able to check pulse independently Yes       Intervention Provide education and demonstration on how to check pulse in carotid and radial arteries.;Review the importance of being able to check your own pulse for safety during independent exercise       Expected Outcomes Short Term: Able to explain why pulse checking is important during independent exercise;Long Term: Able to check pulse independently and accurately       Understanding of Exercise Prescription Yes       Intervention Provide education, explanation, and written materials on patient's individual exercise prescription       Expected Outcomes Short Term: Able to explain program exercise prescription;Long Term: Able to explain home exercise prescription to exercise independently                Exercise Goals Re-Evaluation :  Exercise Goals Re-Evaluation     Row Name 01/07/22 1058 01/22/22 1706 02/04/22 0752 02/05/22 1433 02/18/22 1302     Exercise Goal Re-Evaluation    Exercise Goals Review Able to understand and use rate of perceived exertion (RPE) scale;Able to understand and use Dyspnea scale;Knowledge and understanding of Target Heart Rate Range (THRR);Understanding of Exercise Prescription Increase Physical Activity;Increase Strength and Stamina;Understanding of Exercise Prescription Increase Physical Activity;Increase Strength and Stamina;Understanding of Exercise Prescription;Able to understand and use rate of perceived exertion (RPE) scale;Knowledge and understanding of Target  Heart Rate Range (THRR);Able to understand and use Dyspnea scale;Able to check pulse independently Increase Physical Activity;Increase Strength and Stamina;Understanding of Exercise Prescription Increase Physical Activity;Increase Strength and Stamina;Understanding of Exercise Prescription   Comments Reviewed RPE scale, THR and program prescription with pt today.  Pt voiced understanding and was given a copy of goals to take home. Wille Glaser is doing very well for the first couple of sessions he has been at rehab. He has significanly increased most of his workloads already. Recumbent bike increased to level 7 working at 78 watts! He also increased to a 3.2 mph/3% incline. He worked over 6.5 METs on level on the XR as well! All RPEs are appropriate. We will continue to monitor as he progresses. Wille Glaser is doing well in rehab.  He is trying to improve his stamina and pushing himself more each day.  He was asking about his THR today.  Reviewed home exercise with pt today.  Pt plans to walk and use gym at work for exercise.  Reviewed THR, pulse, RPE, sign and symptoms, pulse oximetery and when to call 911 or MD.  Also discussed weather considerations and indoor options.  Pt voiced understanding. [name] continues to do well in rehab. He recently increased his overall average MET level to 6.04 METs. He also was able to improve to level 5 on the elliptical at a speed of 3.3 mph. He also increased his workload on the  treadmill to a speed of 3.2 mph and an incline of 3.5%. We will continue to monitor his progress in the program. [name] contiunues to do well in rehab. His speed and METs on the elliptical increased overall. He started doing intervals with the treadmill varying his incline up to 8%! He also increased to level 8 on the recumbent bike. We will continue to monitor.   Expected Outcomes Short: Use RPE daily to regulate intensity. Long: Follow program prescription in THR. Short: Continue to follow current exercise prescription Long: Build up overall strength and stamina Short: Start getting to gym on regular basis Long: continue to improve stamina Short: Continue to increase workloads. Long: Continue to improve strength and stamina. Short: Continue with intervals on the treadmill Long: Continue to increase overall MET level and stamina    Row Name 03/03/22 0958 03/04/22 1625 03/18/22 1645 03/24/22 1110       Exercise Goal Re-Evaluation   Exercise Goals Review Increase Physical Activity;Increase Strength and Stamina;Understanding of Exercise Prescription Increase Physical Activity;Increase Strength and Stamina;Understanding of Exercise Prescription Increase Physical Activity;Increase Strength and Stamina;Understanding of Exercise Prescription Increase Physical Activity;Increase Strength and Stamina;Understanding of Exercise Prescription    Comments 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  He passed his stress test and had an abnormal EKG with some elevation.  They did an echo and saw there is some residual damage when they did an echo.  He got winded but no chest pain.  He is into a routine of walking at home.  He is hoping to maintain while returning to work. Wille Glaser is doing well in the program. He has continued to do well with intervals of incline on the treadmill ranging form 2-8% with a speed of 3.5 mph. He also has done well on the elliptical at level 3. He continues to use 8 lb hand weights for resistance training  as well. We will continue to monitor his progress in the program. Wille Glaser is doing well in rehab. He is back up to level 5 on the elliptical and continues to  do intervals on the treadmill. This last session, he stayed consistent at a 5.5% incline. He continues to hit his THR. He is due for his post 6MWT and we hope to see significant improvement. Will continue to monitor. Wille Glaser is doing well in rehab.  He is coming twice a week now that he is back to work.  He is feeling better overall.  He is staying active by playing with his grandkid after work. He knows he needs to make time to get in his exercise.  He is considering graduating early.  They have a gym at work and classes as well. He is planning to go use the treadmill there and possibly take a few classes too.    Expected Outcomes Short; Continue to exercise despite returning to work Long: Conitnue to improve stamina Short: Continue to do intervals on the treadmill. Long: Continue to improve strength and stamina. Short: Improve on post 6MWT Long: Continue to increase overall MET level and stamina Short: Improve post 6MWT Long: Make time to exercise at home             Discharge Exercise Prescription (Final Exercise Prescription Changes):  Exercise Prescription Changes - 03/18/22 1600       Response to Exercise   Blood Pressure (Admit) 124/62    Blood Pressure (Exit) 114/68    Heart Rate (Admit) 65 bpm    Heart Rate (Exercise) 111 bpm    Heart Rate (Exit) 85 bpm    Rating of Perceived Exertion (Exercise) 14    Symptoms none    Duration Continue with 30 min of aerobic exercise without signs/symptoms of physical distress.    Intensity THRR unchanged      Progression   Progression Continue to progress workloads to maintain intensity without signs/symptoms of physical distress.    Average METs 5.27      Resistance Training   Training Prescription Yes    Weight 10 lb    Reps 10-15      Interval Training   Interval Training Yes    Equipment  Treadmill      Treadmill   MPH 3.6    Grade 5.5    Minutes 15    METs 6.49      Recumbant Bike   Level 3    Watts 36    Minutes 15    METs 4.02      Elliptical   Level 5    Speed 3    Minutes 15    METs 3.7      Home Exercise Plan   Plans to continue exercise at Home (comment)   walking, gym at work   Frequency Add 2 additional days to program exercise sessions.    Initial Home Exercises Provided 02/04/22      Oxygen   Maintain Oxygen Saturation 88% or higher             Nutrition:  Target Goals: Understanding of nutrition guidelines, daily intake of sodium '1500mg'$ , cholesterol '200mg'$ , calories 30% from fat and 7% or less from saturated fats, daily to have 5 or more servings of fruits and vegetables.  Education: All About Nutrition: -Group instruction provided by verbal, written material, interactive activities, discussions, models, and posters to present general guidelines for heart healthy nutrition including fat, fiber, MyPlate, the role of sodium in heart healthy nutrition, utilization of the nutrition label, and utilization of this knowledge for meal planning. Follow up email sent as well. Written material given at graduation. Flowsheet  Row Cardiac Rehab from 02/25/2022 in [facility] Cardiac and Pulmonary Rehab  Education need identified 01/05/22       Biometrics:  Pre Biometrics - 01/05/22 1514       Pre Biometrics   Height 5' 9.5" (1.765 m)    Weight 252 lb 9.6 oz (114.6 kg)    Waist Circumference 49.5 inches    Hip Circumference 48 inches    Waist to Hip Ratio 1.03 %    BMI (Calculated) 36.78    Single Leg Stand 30 seconds             Post Biometrics - 03/26/22 1349        Post  Biometrics   Height 5' 9.5" (1.765 m)    Weight 248 lb 11.2 oz (112.8 kg)    Waist Circumference 44 inches    Hip Circumference 47 inches    Waist to Hip Ratio 0.94 %    BMI (Calculated) 36.21    Single Leg Stand 30 seconds             Nutrition Therapy Plan  and Nutrition Goals:  Nutrition Therapy & Goals - 01/05/22 1426       Nutrition Therapy   Diet Heart healthy, low Na    Drug/Food Interactions Statins/Certain Fruits    Protein (specify units) 85-95g    Fiber 30 grams    Whole Grain Foods 3 servings    Saturated Fats 16 max. grams    Fruits and Vegetables 8 servings/day    Sodium 2 grams      Personal Nutrition Goals   Nutrition Goal ST: review paperwork, practice MyPlate guidelines LT: follow MyPlate guidelines, limit saturated fat <16g, limit Na <2g    Comments 52 y.o. M admitted to cardiac rehab s/p NSTEMI. PMHx includes HTN, prediabetes, HLD, gout, CAD, vit D deficiency. Relevant medications includes lipitor, calcium/magnesium/zinc, vit D3. 3x/week will eat at work; he works at [employer]. They have donated many canned items higher in sodium and have been cutting back on sodium in general. they have switched to honey-wheat bread and now having oatmeal with berries. He was eating 2 meals and is now eating 3 meals per day. Discussed heart healthy eating and gout friendly eating.      Intervention Plan   Intervention Prescribe, educate and counsel regarding individualized specific dietary modifications aiming towards targeted core components such as weight, hypertension, lipid management, diabetes, heart failure and other comorbidities.;Nutrition handout(s) given to patient.    Expected Outcomes Short Term Goal: Understand basic principles of dietary content, such as calories, fat, sodium, cholesterol and nutrients.;Short Term Goal: A plan has been developed with personal nutrition goals set during dietitian appointment.;Long Term Goal: Adherence to prescribed nutrition plan.             Nutrition Assessments:  MEDIFICTS Score Key: ?70 Need to make dietary changes  40-70 Heart Healthy Diet ? 40 Therapeutic Level Cholesterol Diet  Flowsheet Row Cardiac Rehab from 01/05/2022 in [facility] Cardiac and Pulmonary Rehab  Picture Your Plate Total  Score on Admission 45      Picture Your Plate Scores: [id number] Unhealthy dietary pattern with much room for improvement. 41-50 Dietary pattern unlikely to meet recommendations for good health and room for improvement. 51-60 More healthful dietary pattern, with some room for improvement.  >60 Healthy dietary pattern, although there may be some specific behaviors that could be improved.    Nutrition Goals Re-Evaluation:  Nutrition Goals Re-Evaluation     [place] Name 02/04/22  [id number] 03/03/22 1000 03/24/22 1115         Goals   Nutrition Goal ST: review paperwork, practice MyPlate guidelines LT: follow MyPlate guidelines, limit saturated fat <16g, limit Na <2g Short: continue to watch sodium Long: continue to work on portion control Short: Conitnue to work on portion control Long: Conitnue to make healthier choices.     Comment [name] is doing well in rehab.  He is doing well with his diet.  He is trying to use the MyPlate guidelines and watch his salt and sugar.  He is doing well with getting in his fruits and vegetables as well.  [name] has been working on cutting back on portion and eating more frequently versus big meals Wille Glaser is doing well in rehab.  He says he eats what he doesn't like.  Said that if it taste good it can't  be good for him.  His wife is really staying on him about his eating. He had a hamburger at a cook out and she jumped all over him.  We talked about using moderation as key  to diet.  He is watching his sodium.  He is doing better with portion control and he has done away with his midnight snacks. Wille Glaser is doing well in rehab.  He continues to work on portion control and his wife makes sure that he sticks to it.  He only eats two meals a day with a few fruit snacks in between.  He is watching his sodium still and can taste it more in foods.  He is trying to pick good snacks when he does get hungry between meals.     Expected Outcome Short: continue to watch sodium Long: continue to work on  portion control Short: Conitnue to work on portion control Long: Conitnue to make healthier choices. Short: Eat at least twice a day Long: conitnue to stick to heart healthy choices              Nutrition Goals Discharge (Final Nutrition Goals Re-Evaluation):  Nutrition Goals Re-Evaluation - 03/24/22 1115       Goals   Nutrition Goal Short: Conitnue to work on portion control Long: Conitnue to make healthier choices.    Comment [name] is doing well in rehab.  He continues to work on portion control and his wife makes sure that he sticks to it.  He only eats two meals a day with a few fruit snacks in between.  He is watching his sodium still and can taste it more in foods.  He is trying to pick good snacks when he does get hungry between meals.    Expected Outcome Short: Eat at least twice a day Long: conitnue to stick to heart healthy choices             Psychosocial: Target Goals: Acknowledge presence or absence of significant depression and/or stress, maximize coping skills, provide positive support system. Participant is able to verbalize types and ability to use techniques and skills needed for reducing stress and depression.   Education: Stress, Anxiety, and Depression - Group verbal and visual presentation to define topics covered.  Reviews how body is impacted by stress, anxiety, and depression.  Also discusses healthy ways to reduce stress and to treat/manage anxiety and depression.  Written material given at graduation.   Education: Sleep Hygiene -Provides group verbal and written instruction about how sleep can affect your health.  Define sleep hygiene, discuss sleep cycles and impact of sleep habits. Review  good sleep hygiene tips.    Initial Review & Psychosocial Screening:  Initial Psych Review & Screening - 12/30/21 1307       Initial Review   Current issues with None Identified      Family Dynamics   Good Support System? Yes   wife, family     Barriers    Psychosocial barriers to participate in program There are no identifiable barriers or psychosocial needs.;The patient should benefit from training in stress management and relaxation.      Screening Interventions   Interventions Encouraged to exercise;Provide feedback about the scores to participant;To provide support and resources with identified psychosocial needs    Expected Outcomes Short Term goal: Utilizing psychosocial counselor, staff and physician to assist with identification of specific Stressors or current issues interfering with healing process. Setting desired goal for each stressor or current issue identified.;Short Term goal: Identification and review with participant of any Quality of Life or Depression concerns found by scoring the questionnaire.;Long Term Goal: Stressors or current issues are controlled or eliminated.;Long Term goal: The participant improves quality of Life and PHQ9 Scores as seen by post scores and/or verbalization of changes             Quality of Life Scores:   Quality of Life - 01/05/22 1516       Quality of Life   Select Quality of Life      Quality of Life Scores   Health/Function Pre 28 %    Socioeconomic Pre 27.19 %    Psych/Spiritual Pre 30 %    Family Pre 30 %    GLOBAL Pre 28.5 %            Scores of 19 and below usually indicate a poorer quality of life in these areas.  A difference of  2-3 points is a clinically meaningful difference.  A difference of 2-3 points in the total score of the Quality of Life Index has been associated with significant improvement in overall quality of life, self-image, physical symptoms, and general health in studies assessing change in quality of life.  PHQ-9: Review Flowsheet  More data exists      02/10/2022 01/05/2022 12/26/2021 12/24/2020 12/20/2019  Depression screen PHQ 2/9  Decreased Interest 0 0 0 0 0  Down, Depressed, Hopeless 0 0 0 0 0  PHQ - 2 Score 0 0 0 0 0  Altered sleeping - 0 - - -   Tired, decreased energy - 0 - - -  Change in appetite - 0 - - -  Feeling bad or failure about yourself  - 0 - - -  Moving slowly or fidgety/restless - 0 - - -  Suicidal thoughts - 0 - - -  PHQ-9 Score - 0 - - -  Difficult doing work/chores - Not difficult at all - - -   Interpretation of Total Score  Total Score Depression Severity:  1-4 = Minimal depression, 5-9 = Mild depression, 10-14 = Moderate depression, 15-19 = Moderately severe depression, 20-27 = Severe depression   Psychosocial Evaluation and Intervention:  Psychosocial Evaluation - 12/30/21 1313       Psychosocial Evaluation & Interventions   Interventions Encouraged to exercise with the program and follow exercise prescription    Comments Wille Glaser 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 He reports feeling well and wanting to increase his activity level. He thinks he will be out of work for 2 months. 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 He reports no stress concerns. His wife and family are very supportive. He is looking forward to learning more about the nutritional aspect of heart healthy living as that is his and his wife's main topic of discussion    Expected Outcomes Short: attend cardiac rehab for education and exercise. Long: develop and maintain positive self care habits.    Continue Psychosocial Services  Follow up required by staff             Psychosocial Re-Evaluation:  Psychosocial Re-Evaluation     Row Name 02/04/22 0754 03/03/22 0956 03/24/22 1112         Psychosocial Re-Evaluation   Current issues with Current Stress Concerns Current Stress Concerns Current Stress Concerns     Comments Wille Glaser is doing well in rehab.  He goes back to work on February 1.  He tries not to let too many things get to him.  He does have a stress test scheduled for 1/30 prior to returning to work.  Once he gets back, he will need to change his schedule up some.  He does not  that he has an occasional pain that doesn't last long.  He does want to finish the program.  Overall, he is staying positive. He also sleeps well for most part. Wille Glaser is doing well in rehab.  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  He returns to work on Monday.  He switched classes to help with return to work.  He has an interview for a new position tomorrow which is day shift.  He continues to sleep well currently. [name] is back to work now and coming twice a week to finish up.  He has been interviewing for a new position and has been through two round of interviews thus far.  He hopes to hear something by the end of the week.  He is working hard and making most of each day.  He is still sleeping well.  He is considering graduating early since rehab cuts into his sleep time on Tuesdays.     Expected Outcomes short: continue to prepare to return to work Long: Continue to stay positive Short: Return to work Long: conitnue to exercise despite returning back to work. Short; Hear back from interview Long: Continue to stay positive and make time to exercise     Interventions Encouraged to attend Cardiac Rehabilitation for the exercise Encouraged to attend Cardiac Rehabilitation for the exercise Encouraged to attend Cardiac Rehabilitation for the exercise     Continue Psychosocial Services  Follow up required by staff Follow up required by staff Follow up required by staff              Psychosocial Discharge (Final Psychosocial Re-Evaluation):  Psychosocial Re-Evaluation - 03/24/22 1112       Psychosocial Re-Evaluation   Current issues with Current Stress Concerns    Comments Wille Glaser is back to work now and coming twice a week to finish up.  He has been interviewing for a new position and has been through two round of interviews thus far.  He hopes to hear something by the end of the week.  He is working hard and making most of each day.  He is still sleeping well.  He is considering  graduating early since rehab cuts into his sleep time on Tuesdays.    Expected Outcomes Short; Hear back from interview Long: Continue to stay positive and  make time to exercise    Interventions Encouraged to attend Cardiac Rehabilitation for the exercise    Continue Psychosocial Services  Follow up required by staff             Vocational Rehabilitation: Provide vocational rehab assistance to qualifying candidates.   Vocational Rehab Evaluation & Intervention:  Vocational Rehab - 12/30/21 1307       Initial Vocational Rehab Evaluation & Intervention   Assessment shows need for Vocational Rehabilitation No             Education: Education Goals: Education classes will be provided on a variety of topics geared toward better understanding of heart health and risk factor modification. Participant will state understanding/return demonstration of topics presented as noted by education test scores.  Learning Barriers/Preferences:  Learning Barriers/Preferences - 12/30/21 1307       Learning Barriers/Preferences   Learning Barriers None    Learning Preferences None             General Cardiac Education Topics:  AED/CPR: - Group verbal and written instruction with the use of models to demonstrate the basic use of the AED with the basic ABC's of resuscitation.   Anatomy and Cardiac Procedures: - Group verbal and visual presentation and models provide information about basic cardiac anatomy and function. Reviews the testing methods done to diagnose heart disease and the outcomes of the test results. Describes the treatment choices: Medical Management, Angioplasty, or Coronary Bypass Surgery for treating various heart conditions including Myocardial Infarction, Angina, Valve Disease, and Cardiac Arrhythmias.  Written material given at graduation.   Medication Safety: - Group verbal and visual instruction to review commonly prescribed medications for heart and lung disease.  Reviews the medication, class of the drug, and side effects. Includes the steps to properly store meds and maintain the prescription regimen.  Written material given at graduation. Flowsheet Row Cardiac Rehab from 02/25/2022 in [facility] Cardiac and Pulmonary Rehab  Date 01/07/22  Educator SB  Instruction Review Code 1- Verbalizes Understanding       Intimacy: - Group verbal instruction through game format to discuss how heart and lung disease can affect sexual intimacy. Written material given at graduation.. Flowsheet Row Cardiac Rehab from 02/25/2022 in [facility] Cardiac and Pulmonary Rehab  Date 02/11/22  Educator [facility]  Instruction Review Code 1- Verbalizes Understanding       Know Your Numbers and Heart Failure: - Group verbal and visual instruction to discuss disease risk factors for cardiac and pulmonary disease and treatment options.  Reviews associated critical values for Overweight/Obesity, Hypertension, Cholesterol, and Diabetes.  Discusses basics of heart failure: signs/symptoms and treatments.  Introduces Heart Failure Zone chart for action plan for heart failure.  Written material given at graduation. Flowsheet Row Cardiac Rehab from 02/25/2022 in [facility] Cardiac and Pulmonary Rehab  Date 01/14/22  Educator SB  Instruction Review Code 1- Verbalizes Understanding       Infection Prevention: - Provides verbal and written material to individual with discussion of infection control including proper hand washing and proper equipment cleaning during exercise session. Flowsheet Row Cardiac Rehab from 02/25/2022 in [facility] Cardiac and Pulmonary Rehab  Date 01/05/22  Educator [facility]  Instruction Review Code 1- Verbalizes Understanding       Falls Prevention: - Provides verbal and written material to individual with discussion of falls prevention and safety. Flowsheet Row Cardiac Rehab from 02/25/2022 in [facility] Cardiac and Pulmonary Rehab  Date 01/05/22  Educator [facility]  Instruction Review Code 1-  Verbalizes Understanding       Other: -Provides group and verbal instruction on various topics (see comments)   Knowledge Questionnaire Score:  Knowledge Questionnaire Score - 01/05/22 1517       Knowledge Questionnaire Score   Pre Score 24/26             Core Components/Risk Factors/Patient Goals at Admission:  Personal Goals and Risk Factors at Admission - 01/05/22 1517       Core Components/Risk Factors/Patient Goals on Admission    Weight Management Yes;Weight Loss    Intervention Weight Management: Develop a combined nutrition and exercise program designed to reach desired caloric intake, while maintaining appropriate intake of nutrient and fiber, sodium and fats, and appropriate energy expenditure required for the weight goal.;Weight Management: Provide education and appropriate resources to help participant work on and attain dietary goals.;Weight Management/Obesity: Establish reasonable short term and long term weight goals.;Obesity: Provide education and appropriate resources to help participant work on and attain dietary goals.    Admit Weight 252 lb 9.6 oz (114.6 kg)    Goal Weight: Short Term 245 lb (111.1 kg)    Goal Weight: Long Term 200 lb (90.7 kg)    Expected Outcomes Short Term: Continue to assess and modify interventions until short term weight is achieved;Long Term: Adherence to nutrition and physical activity/exercise program aimed toward attainment of established weight goal;Weight Loss: Understanding of general recommendations for a balanced deficit meal plan, which promotes 1-2 lb weight loss per week and includes a negative energy balance of [phone] kcal/d;Understanding recommendations for meals to include 15-35% energy as protein, 25-35% energy from fat, 35-60% energy from carbohydrates, less than '200mg'$  of dietary cholesterol, 20-35 gm of total fiber daily;Understanding of distribution of calorie intake throughout the day with the consumption of 4-5  meals/snacks    Hypertension Yes    Intervention Provide education on lifestyle modifcations including regular physical activity/exercise, weight management, moderate sodium restriction and increased consumption of fresh fruit, vegetables, and low fat dairy, alcohol moderation, and smoking cessation.;Monitor prescription use compliance.    Expected Outcomes Short Term: Continued assessment and intervention until BP is < 140/67m HG in hypertensive participants. < 130/865mHG in hypertensive participants with diabetes, heart failure or chronic kidney disease.;Long Term: Maintenance of blood pressure at goal levels.    Lipids Yes    Intervention Provide education and support for participant on nutrition & aerobic/resistive exercise along with prescribed medications to achieve LDL '70mg'$ , HDL >'40mg'$ .    Expected Outcomes Short Term: Participant states understanding of desired cholesterol values and is compliant with medications prescribed. Participant is following exercise prescription and nutrition guidelines.;Long Term: Cholesterol controlled with medications as prescribed, with individualized exercise RX and with personalized nutrition plan. Value goals: LDL < '70mg'$ , HDL > 40 mg.             Education:Diabetes - Individual verbal and written instruction to review signs/symptoms of diabetes, desired ranges of glucose level fasting, after meals and with exercise. Acknowledge that pre and post exercise glucose checks will be done for 3 sessions at entry of program.   Core Components/Risk Factors/Patient Goals Review:   Goals and Risk Factor Review     Row Name 02/04/22 0759 03/03/22 1002 03/24/22 1118         Core Components/Risk Factors/Patient Goals Review   Personal Goals Review Weight Management/Obesity;Hypertension Weight Management/Obesity;Hypertension Weight Management/Obesity;Hypertension     Review JoWille Glasers doing well in rehab.   He is starting to lose his weight.  He can now tie his  shoes and get to his feet easier.  His pessures are doing well and he checks them at least once a day in the morning at home.  He is still trying to fight off cold symptoms. Wille Glaser is doing well.  His stress echo showed his EF was good and he passed to go back to work.  He did have some elevation, but blood is moving through just not a lot.  He is feeling good without chest pain, just different EKG.  His weight is staying steady. He really wants to lose weight but has not been able to break 240 mark but he is just going to keep going again.  His pressures are doing well. He is doing well with his medicaitons as well. [name] has done well in rehab.  His weight is not going down as fast as he wants.  He has been plateaued for a while and finds it frustrating.  He really would like to work his way down to 210lb.  His pressures are doing well and he checks them routinely at home.     Expected Outcomes Short; Continue to work on weight loss lOng: Continue to monitor risk factors Short: Continue to work on weight loss through plateau Long: conitnue to montior risk factors Continue to work on weight loss and monitor risk factors.              Core Components/Risk Factors/Patient Goals at Discharge (Final Review):   Goals and Risk Factor Review - 03/24/22 1118       Core Components/Risk Factors/Patient Goals Review   Personal Goals Review Weight Management/Obesity;Hypertension    Review [name] has done well in rehab.  His weight is not going down as fast as he wants.  He has been plateaued for a while and finds it frustrating.  He really would like to work his way down to 210lb.  His pressures are doing well and he checks them routinely at home.    Expected Outcomes Continue to work on weight loss and monitor risk factors.             ITP Comments:  ITP Comments     Row Name 12/30/21 1320 01/05/22 1505 01/07/22 1058 01/21/22 1010 02/18/22 0918   ITP Comments Initial telephone orientation completed.  Diagnosis can be found in [facility] 11/22. EP orientation scheduled for Monday 12/11 at 1:30. Completed 6MWT and gym orientation. Initial ITP created and sent for review to Dr. Emily Filbert, Medical Director. First full day of exercise!  Patient was oriented to gym and equipment including functions, settings, policies, and procedures.  Patient's individual exercise prescription and treatment plan were reviewed.  All starting workloads were established based on the results of the 6 minute walk test done at initial orientation visit.  The plan for exercise progression was also introduced and progression will be customized based on patient's performance and goals. 30 Day review completed. Medical Director ITP review done, changes made as directed, and signed approval by Medical Director.     new to program 30 Day review completed. Medical Director ITP review done, changes made as directed, and signed approval by Medical Director.    [place] Name 03/18/22 1240 03/26/22 1351         ITP Comments 30 day review completed. ITP sent to Dr. Emily Filbert, Medical Director of Cardiac Rehab. Continue with ITP unless changes are made by physician. Bryden graduated today from  rehab with 30 sessions completed.  Details of the patient's exercise prescription and what He needs to do in order to continue the prescription and progress were discussed with patient.  Patient was given a copy of prescription and goals.  Patient verbalized understanding.  Tavarious plans to continue to exercise by walking and the gym at his work.               Comments: Discharge ITP

## 2022-03-26 NOTE — Patient Instructions (Addendum)
Discharge Patient Instructions  Patient Details  Name: Eric Huff MRN: [id number] Date of Birth: 14-May-1970 Referring Provider:  Ria Bush, MD   Number of Visits: 30  Reason for Discharge:  Patient reached a stable level of exercise. Patient independent in their exercise. Patient has met program and personal goals.  Smoking History:  Social History   Tobacco Use  Smoking Status Former   Types: Cigarettes, Cigars   Quit date: 12/19/2021   Years since quitting: 0.2  Smokeless Tobacco Former  Tobacco Comments   Quit cigarettes in 2009.  Quit cigars in November 2023    Diagnosis:  NSTEMI (non-ST elevated myocardial infarction) [facility])  Status post coronary artery stent placement  Initial Exercise Prescription:  Initial Exercise Prescription - 01/05/22 1500       Date of Initial Exercise RX and Referring Provider   Date 01/05/22    Referring Provider Arida      Oxygen   Maintain Oxygen Saturation 88% or higher      Treadmill   MPH 3    Grade 1    Minutes 15    METs 3.71      Recumbant Bike   Level 3    RPM 50    Minutes 15    METs 3.52      Elliptical   Level 1    Speed 3    Minutes 15    METs 3.52      REL-XR   Level 3    Speed 50    Minutes 15    METs 3.52      Biostep-RELP   Level 3    SPM 50    Minutes 15    METs 3.52      Track   Laps 42    Minutes 15    METs 3.28      Prescription Details   Frequency (times per week) 3    Duration Progress to 30 minutes of continuous aerobic without signs/symptoms of physical distress      Intensity   THRR 40-80% of Max Heartrate 100-144    Ratings of Perceived Exertion 11-13    Perceived Dyspnea 0-4      Progression   Progression Continue to progress workloads to maintain intensity without signs/symptoms of physical distress.      Resistance Training   Training Prescription Yes    Weight 5    Reps 10-15             Discharge Exercise Prescription (Final Exercise  Prescription Changes):  Exercise Prescription Changes - 03/18/22 1600       Response to Exercise   Blood Pressure (Admit) 124/62    Blood Pressure (Exit) 114/68    Heart Rate (Admit) 65 bpm    Heart Rate (Exercise) 111 bpm    Heart Rate (Exit) 85 bpm    Rating of Perceived Exertion (Exercise) 14    Symptoms none    Duration Continue with 30 min of aerobic exercise without signs/symptoms of physical distress.    Intensity THRR unchanged      Progression   Progression Continue to progress workloads to maintain intensity without signs/symptoms of physical distress.    Average METs 5.27      Resistance Training   Training Prescription Yes    Weight 10 lb    Reps 10-15      Interval Training   Interval Training Yes    Equipment Treadmill      Treadmill  MPH 3.6    Grade 5.5    Minutes 15    METs 6.49      Recumbant Bike   Level 3    Watts 36    Minutes 15    METs 4.02      Elliptical   Level 5    Speed 3    Minutes 15    METs 3.7      Home Exercise Plan   Plans to continue exercise at Home (comment)   walking, gym at work   Frequency Add 2 additional days to program exercise sessions.    Initial Home Exercises Provided 02/04/22      Oxygen   Maintain Oxygen Saturation 88% or higher             Functional Capacity:  6 Minute Walk     [place] Name 01/05/22 1505 03/26/22 1348       6 Minute Walk   Phase Initial Discharge    Distance 1615 feet 1700 feet    Distance % Change -- 5.3 %    Distance Feet Change -- 85 ft    Walk Time 6 minutes 6 minutes    # of Rest Breaks 0 0    MPH 3.06 3.21    METS 3.52 4.05    RPE 8 13    Perceived Dyspnea  0 --    VO2 Peak 12.32 14.17    Symptoms No No    Resting HR 56 bpm 67 bpm    Resting BP 122/84 118/60    Resting Oxygen Saturation  98 % 97 %    Exercise Oxygen Saturation  during 6 min walk 98 % 97 %    Max Ex. HR 101 bpm 107 bpm    Max Ex. BP 158/80 126/62    2 Minute Post BP 124/62 --              Nutrition & Weight - Outcomes:  Pre Biometrics - 01/05/22 1514       Pre Biometrics   Height 5' 9.5" (1.765 m)    Weight 252 lb 9.6 oz (114.6 kg)    Waist Circumference 49.5 inches    Hip Circumference 48 inches    Waist to Hip Ratio 1.03 %    BMI (Calculated) 36.78    Single Leg Stand 30 seconds             Post Biometrics - 03/26/22 1349        Post  Biometrics   Height 5' 9.5" (1.765 m)    Weight 248 lb 11.2 oz (112.8 kg)    Waist Circumference 44 inches    Hip Circumference 47 inches    Waist to Hip Ratio 0.94 %    BMI (Calculated) 36.21    Single Leg Stand 30 seconds             Nutrition:  Nutrition Therapy & Goals - 01/05/22 1426       Nutrition Therapy   Diet Heart healthy, low Na    Drug/Food Interactions Statins/Certain Fruits    Protein (specify units) 85-95g    Fiber 30 grams    Whole Grain Foods 3 servings    Saturated Fats 16 max. grams    Fruits and Vegetables 8 servings/day    Sodium 2 grams      Personal Nutrition Goals   Nutrition Goal ST: review paperwork, practice MyPlate guidelines LT: follow MyPlate guidelines, limit saturated fat <16g, limit Na <2g  Comments 52 y.o. M admitted to cardiac rehab s/p NSTEMI. PMHx includes HTN, prediabetes, HLD, gout, CAD, vit D deficiency. Relevant medications includes lipitor, calcium/magnesium/zinc, vit D3. 3x/week will eat at work; he works at [employer]. They have donated many canned items higher in sodium and have been cutting back on sodium in general. they have switched to honey-wheat bread and now having oatmeal with berries. He was eating 2 meals and is now eating 3 meals per day. Discussed heart healthy eating and gout friendly eating.      Intervention Plan   Intervention Prescribe, educate and counsel regarding individualized specific dietary modifications aiming towards targeted core components such as weight, hypertension, lipid management, diabetes, heart failure and other  comorbidities.;Nutrition handout(s) given to patient.    Expected Outcomes Short Term Goal: Understand basic principles of dietary content, such as calories, fat, sodium, cholesterol and nutrients.;Short Term Goal: A plan has been developed with personal nutrition goals set during dietitian appointment.;Long Term Goal: Adherence to prescribed nutrition plan.           Goals reviewed with patient; copy given to patient.

## 2022-03-26 NOTE — Progress Notes (Signed)
Discharge Summary: Eric Huff (DOB: Aug 25, 1970)  Eric Huff graduated today from  rehab with 30 sessions completed.  Details of the patient's exercise prescription and what Eric Huff needs to do in order to continue the prescription and progress were discussed with patient.  Patient was given a copy of prescription and goals.  Patient verbalized understanding.  [name] plans to continue to exercise by walking and the gym at his work.   [place] Name 01/05/22 1505 03/26/22 1348       6 Minute Walk   Phase Initial Discharge    Distance 1615 feet 1700 feet    Distance % Change -- 5.3 %    Distance Feet Change -- 85 ft    Walk Time 6 minutes 6 minutes    # of Rest Breaks 0 0    MPH 3.06 3.21    METS 3.52 4.05    RPE 8 13    Perceived Dyspnea  0 --    VO2 Peak 12.32 14.17    Symptoms No No    Resting HR 56 bpm 67 bpm    Resting BP 122/84 118/60    Resting Oxygen Saturation  98 % 97 %    Exercise Oxygen Saturation  during 6 min walk 98 % 97 %    Max Ex. HR 101 bpm 107 bpm    Max Ex. BP 158/80 126/62    2 Minute Post BP 124/62 --

## 2022-03-31 ENCOUNTER — Ambulatory Visit (INDEPENDENT_AMBULATORY_CARE_PROVIDER_SITE_OTHER)
Admission: RE | Admit: 2022-03-31 | Discharge: 2022-03-31 | Disposition: A | Discharge status: Home / Self Care | Payer: BC Managed Care – PPO | Source: Ambulatory Visit | Attending: Family Medicine | Admitting: Family Medicine

## 2022-03-31 ENCOUNTER — Encounter: Payer: Self-pay | Admitting: Family Medicine

## 2022-03-31 ENCOUNTER — Ambulatory Visit (INDEPENDENT_AMBULATORY_CARE_PROVIDER_SITE_OTHER): Payer: BC Managed Care – PPO | Admitting: Family Medicine

## 2022-03-31 VITALS — BP 132/80 | HR 62 | Temp 97.3°F | Ht 69.5 in | Wt 247.2 lb

## 2022-03-31 DIAGNOSIS — M7989 Other specified soft tissue disorders: Secondary | ICD-10-CM | POA: Diagnosis not present

## 2022-03-31 DIAGNOSIS — M1A09X0 Idiopathic chronic gout, multiple sites, without tophus (tophi): Secondary | ICD-10-CM

## 2022-03-31 DIAGNOSIS — N529 Male erectile dysfunction, unspecified: Secondary | ICD-10-CM | POA: Diagnosis not present

## 2022-03-31 DIAGNOSIS — M25531 Pain in right wrist: Secondary | ICD-10-CM | POA: Diagnosis not present

## 2022-03-31 DIAGNOSIS — I214 Non-ST elevation (NSTEMI) myocardial infarction: Secondary | ICD-10-CM

## 2022-03-31 LAB — COMPREHENSIVE METABOLIC PANEL
ALT: 39 U/L (ref 0–53)
AST: 32 U/L (ref 0–37)
Albumin: 4.3 g/dL (ref 3.5–5.2)
Alkaline Phosphatase: 93 U/L (ref 39–117)
BUN: 18 mg/dL (ref 6–23)
CO2: 28 mEq/L (ref 19–32)
Calcium: 10 mg/dL (ref 8.4–10.5)
Chloride: 102 mEq/L (ref 96–112)
Creatinine, Ser: 1.04 mg/dL (ref 0.40–1.50)
GFR: 81.9 mL/min (ref >60.00)
Glucose, Bld: 95 mg/dL (ref 70–99)
Potassium: 4.4 mEq/L (ref 3.5–5.1)
Sodium: 139 mEq/L (ref 135–145)
Total Bilirubin: 0.7 mg/dL (ref 0.2–1.2)
Total Protein: 6.8 g/dL (ref 6.0–8.3)

## 2022-03-31 LAB — TSH: TSH: 1.71 u[IU]/mL (ref 0.35–5.50)

## 2022-03-31 LAB — URIC ACID: Uric Acid, Serum: 7.9 mg/dL — ABNORMAL HIGH (ref 4.0–7.8)

## 2022-03-31 MED ORDER — SILDENAFIL CITRATE 50 MG PO TABS: 50.0000 mg | ORAL_TABLET | Freq: Every day | ORAL | 0 refills | Status: AC | PRN

## 2022-03-31 MED ORDER — SILDENAFIL CITRATE 50 MG PO TABS: 50.0000 mg | ORAL_TABLET | Freq: Every day | ORAL | 0 refills | Status: DC | PRN

## 2022-03-31 MED ORDER — ALLOPURINOL 100 MG PO TABS: 200.0000 mg | ORAL_TABLET | Freq: Every day | ORAL | 6 refills | Status: DC

## 2022-03-31 NOTE — Patient Instructions (Addendum)
Labs today - pending results we may increase allopurinol dose.  Xray today  Viagra Rx printed out, I recommend waiting for 6 months after 11/2021, touch base with cardiology prior to use.  Regarding weight loss, call insurance to see if Mancel Parsons is covered and let me know.

## 2022-03-31 NOTE — Progress Notes (Signed)
Patient ID: Eric Huff, male    DOB: 08-07-1970, 52 y.o.   MRN: [id number]  This visit was conducted in person.  BP 132/80   Pulse 62   Temp (!) 97.3 F (36.3 C) (Temporal)   Ht 5' 9.5" (1.765 m)   Wt 247 lb 4 oz (112.2 kg)   SpO2 97%   BMI 35.99 kg/m    CC: 3-4 mo gout f/u visit  Subjective:   HPI: Eric Huff is a 52 y.o. male presenting on 03/31/2022 for Medical Management of Chronic Issues (Here for 3-4 mo gout f/u. Pt accompanied by wife, [name]. )   Undergoing cardiac rehab after NSTEMI s/p DES placement x2 to [facility] 11/2021.   Saw Dr Fletcher Anon for R hand pain/swelling after radial catheterization in November. Treated with colchicine + medrol dosepak with significant benefit.  Scheduled appt for rheum f/u 04/2022.  Has had residual limitation in ROM at right wrist  He's also having R lateral elbow pain.   Known gout on allopurinol '100mg'$  daily, started 12/2021.  He's started low purine diet.  Lab Results  Component Value Date   LABURIC 8.7 (H) 12/16/2021   Interested in weight loss medication.  No fmhx thyroid cancer.  Thyroid disease runs in family.   Upcoming HST to eval OSA through work.      Relevant past medical, surgical, family and social history reviewed and updated as indicated. Interim medical history since our last visit reviewed. Allergies and medications reviewed and updated. Outpatient Medications Prior to Visit  Medication Sig Dispense Refill  . aspirin EC 81 MG tablet Take 1 tablet (81 mg total) by mouth daily. Swallow whole.    Marland Kitchen atorvastatin (LIPITOR) 80 MG tablet Take 1 tablet (80 mg total) by mouth daily. 90 tablet 3  . CALCIUM-MAGNESIUM-ZINC PO Take 3 tablets by mouth daily.    . carvedilol (COREG) 3.125 MG tablet Take 1 tablet (3.125 mg total) by mouth 2 (two) times daily with a meal. 180 tablet 3  . Cholecalciferol (VITAMIN D3) 125 MCG (5000 UT) CAPS Take 1 capsule by mouth daily.    . colchicine 0.6 MG tablet Take one tablet  twice daily for 7 days, then take one tablet daily 37 tablet 0  . loratadine (CLARITIN) 10 MG tablet Take 10 mg by mouth daily.    Marland Kitchen losartan (COZAAR) 25 MG tablet Take 1 tablet (25 mg total) by mouth daily. 90 tablet 3  . prasugrel (EFFIENT) 10 MG TABS tablet Take 1 tablet (10 mg total) by mouth daily. 30 tablet 11  . allopurinol (ZYLOPRIM) 100 MG tablet Take 1 tablet (100 mg total) by mouth daily. 30 tablet 6  . methylPREDNISolone (MEDROL DOSEPAK) 4 MG TBPK tablet Take 6 tablets the first day, 5 tablets the next day, 4 tablets the next, 3 tablets the next, 2 tablets the next and then the last tablet for a total of 6 days. (Patient not taking: Reported on 02/25/2022) 21 tablet 0   No facility-administered medications prior to visit.     Per HPI unless specifically indicated in ROS section below Review of Systems  Objective:  BP 132/80   Pulse 62   Temp (!) 97.3 F (36.3 C) (Temporal)   Ht 5' 9.5" (1.765 m)   Wt 247 lb 4 oz (112.2 kg)   SpO2 97%   BMI 35.99 kg/m   Wt Readings from Last 3 Encounters:  03/31/22 247 lb 4 oz (112.2 kg)  03/26/22 248 lb 11.2  oz (112.8 kg)  02/25/22 254 lb 6.4 oz (115.4 kg)      Physical Exam Vitals and nursing note reviewed.  Constitutional:      Appearance: Normal appearance. He is not ill-appearing.  Musculoskeletal:        General: Tenderness present. No swelling.     Comments:  2+ rad pulses bilaterally No pain at scaphoid or at 1st [facility] bilaterally Limited ROM to R wrist with flexion/extension Mild discomfort to palpation   Skin:    General: Skin is warm and dry.     Findings: No erythema or rash.  Neurological:     Mental Status: He is alert.  Psychiatric:        Mood and Affect: Mood normal.        Behavior: Behavior normal.      Results for orders placed or performed during the hospital encounter of 02/27/22  Hepatic function panel  Result Value Ref Range   Total Protein 7.1 6.5 - 8.1 g/dL   Albumin 4.0 3.5 - 5.0 g/dL   AST 72  (H) 15 - 41 U/L   ALT 149 (H) 0 - 44 U/L   Alkaline Phosphatase 90 38 - 126 U/L   Total Bilirubin 1.3 (H) 0.3 - 1.2 mg/dL   Bilirubin, Direct 0.2 0.0 - 0.2 mg/dL   Indirect Bilirubin 1.1 (H) 0.3 - 0.9 mg/dL  Lipid panel  Result Value Ref Range   Cholesterol 127 0 - 200 mg/dL   Triglycerides 127 <150 mg/dL   HDL 34 (L) >40 mg/dL   Total CHOL/HDL Ratio 3.7 RATIO   VLDL 25 0 - 40 mg/dL   LDL Cholesterol 68 0 - 99 mg/dL   Lab Results  Component Value Date   TSH 1.20 10/29/2016    Assessment & Plan:   Problem List Items Addressed This Visit     Chronic gout - Primary   Relevant Orders   Comprehensive metabolic panel   Uric acid   TSH   DG Wrist Complete Right   Other Visit Diagnoses     Right wrist pain       Relevant Orders   DG Wrist Complete Right        Meds ordered this encounter  Medications  . sildenafil (VIAGRA) 50 MG tablet    Sig: Take 1 tablet (50 mg total) by mouth daily as needed for erectile dysfunction.    Dispense:  10 tablet    Refill:  0  . allopurinol (ZYLOPRIM) 100 MG tablet    Sig: Take 2 tablets (200 mg total) by mouth daily.    Dispense:  60 tablet    Refill:  6    Orders Placed This Encounter  Procedures  . DG Wrist Complete Right    Standing Status:   Future    Number of Occurrences:   1    Standing Expiration Date:   03/31/2023    Order Specific Question:   Reason for Exam (SYMPTOM  OR DIAGNOSIS REQUIRED)    Answer:   R wrist with persistent limited ROM after gout flare    Order Specific Question:   Preferred imaging location?    Answer:   [facility]  . Comprehensive metabolic panel  . Uric acid  . TSH    Patient Instructions  Labs today - pending results we may increase allopurinol dose.  Xray today  Viagra Rx printed out, I recommend waiting for 6 months after 11/2021, touch base with cardiology prior to use.  Regarding weight loss, call insurance to see if Mancel Parsons is covered and let me know.   Follow up  plan: Return in about 3 months (around 07/01/2022), or if symptoms worsen or fail to improve, for follow up visit.  Ria Bush, MD

## 2022-04-01 NOTE — Assessment & Plan Note (Addendum)
Continue to encourage healthy diet and lifestyle choices to affect sustainable weight loss.  Interested in bariatric medication - discussed GLP1RA mechanism of action as well as common side effects /adverse events to watch for.  No fmhx medullary thyroid cancer.  He will contact insurance to see if these meds are covered, specifically Wegovy.

## 2022-04-01 NOTE — Assessment & Plan Note (Addendum)
Notes ongoing difficulty with this.  Has previously tolerated viagra well - will refill.  Reviewed side effects to watch for.  Given recent NSTEMI, recommend wait until 6 months post MI prior to trying viagra. I also asked him to ensure ok by cardiology.  Cialis daily dose not effective.

## 2022-04-01 NOTE — Progress Notes (Incomplete)
Patient ID: Eric Huff, male    DOB: [birth year], 52 y.o.   MRN: [id number]  This visit was conducted in person.  BP 132/80   Pulse 62   Temp (!) 97.3 F (36.3 C) (Temporal)   Ht 5' 9.5" (1.765 m)   Wt 247 lb 4 oz (112.2 kg)   SpO2 97%   BMI 35.99 kg/m    CC: 3-4 mo gout f/u visit  Subjective:   HPI: Eric Huff is a 53 y.o. male presenting on 03/31/2022 for Medical Management of Chronic Issues (Here for 3-4 mo gout f/u. Pt accompanied by wife, [name]. )   Undergoing cardiac rehab after NSTEMI s/p DES placement x2 to [facility] 11/2021.   Saw Dr Fletcher Anon for R hand pain/swelling after radial catheterization in November. Treated with colchicine + medrol dosepak with significant benefit.  Scheduled appt for rheum f/u 04/2022.  Has had residual limitation in ROM at right wrist  He's also having R lateral elbow pain.   Known gout on allopurinol '100mg'$  daily, started 12/2021.  He's started low purine diet.  Lab Results  Component Value Date   LABURIC 8.7 (H) 12/16/2021   Interested in weight loss medication.  No fmhx thyroid cancer.  Thyroid disease runs in family.   Upcoming HST to eval OSA through work.      Relevant past medical, surgical, family and social history reviewed and updated as indicated. Interim medical history since our last visit reviewed. Allergies and medications reviewed and updated. Outpatient Medications Prior to Visit  Medication Sig Dispense Refill  . aspirin EC 81 MG tablet Take 1 tablet (81 mg total) by mouth daily. Swallow whole.    Marland Kitchen atorvastatin (LIPITOR) 80 MG tablet Take 1 tablet (80 mg total) by mouth daily. 90 tablet 3  . CALCIUM-MAGNESIUM-ZINC PO Take 3 tablets by mouth daily.    . carvedilol (COREG) 3.125 MG tablet Take 1 tablet (3.125 mg total) by mouth 2 (two) times daily with a meal. 180 tablet 3  . Cholecalciferol (VITAMIN D3) 125 MCG (5000 UT) CAPS Take 1 capsule by mouth daily.    . colchicine 0.6 MG tablet Take one tablet  twice daily for 7 days, then take one tablet daily 37 tablet 0  . loratadine (CLARITIN) 10 MG tablet Take 10 mg by mouth daily.    Marland Kitchen losartan (COZAAR) 25 MG tablet Take 1 tablet (25 mg total) by mouth daily. 90 tablet 3  . prasugrel (EFFIENT) 10 MG TABS tablet Take 1 tablet (10 mg total) by mouth daily. 30 tablet 11  . allopurinol (ZYLOPRIM) 100 MG tablet Take 1 tablet (100 mg total) by mouth daily. 30 tablet 6  . methylPREDNISolone (MEDROL DOSEPAK) 4 MG TBPK tablet Take 6 tablets the first day, 5 tablets the next day, 4 tablets the next, 3 tablets the next, 2 tablets the next and then the last tablet for a total of 6 days. (Patient not taking: Reported on 02/25/2022) 21 tablet 0   No facility-administered medications prior to visit.     Per HPI unless specifically indicated in ROS section below Review of Systems  Objective:  BP 132/80   Pulse 62   Temp (!) 97.3 F (36.3 C) (Temporal)   Ht 5' 9.5" (1.765 m)   Wt 247 lb 4 oz (112.2 kg)   SpO2 97%   BMI 35.99 kg/m   Wt Readings from Last 3 Encounters:  03/31/22 247 lb 4 oz (112.2 kg)  03/26/22 248 lb 11.2  oz (112.8 kg)  02/25/22 254 lb 6.4 oz (115.4 kg)      Physical Exam Vitals and nursing note reviewed.  Constitutional:      Appearance: Normal appearance. He is not ill-appearing.  Musculoskeletal:        General: Tenderness present. No swelling.     Comments:  2+ rad pulses bilaterally No pain at scaphoid or at 1st [facility] bilaterally Limited ROM to R wrist with flexion/extension Discomfort to palpation at dorsal R wrist No significant pain at elbow or medial/lateral epicondyle on left Discomfort with active pronation/supination against resistance  Skin:    General: Skin is warm and dry.     Findings: No erythema or rash.     Comments: Postinflammatory hyperpigmentation to R lateral forearm distal to elbow  Neurological:     Mental Status: He is alert.  Psychiatric:        Mood and Affect: Mood normal.        Behavior:  Behavior normal.       Results for orders placed or performed during the hospital encounter of 02/27/22  Hepatic function panel  Result Value Ref Range   Total Protein 7.1 6.5 - 8.1 g/dL   Albumin 4.0 3.5 - 5.0 g/dL   AST 72 (H) 15 - 41 U/L   ALT 149 (H) 0 - 44 U/L   Alkaline Phosphatase 90 38 - 126 U/L   Total Bilirubin 1.3 (H) 0.3 - 1.2 mg/dL   Bilirubin, Direct 0.2 0.0 - 0.2 mg/dL   Indirect Bilirubin 1.1 (H) 0.3 - 0.9 mg/dL  Lipid panel  Result Value Ref Range   Cholesterol 127 0 - 200 mg/dL   Triglycerides 127 <150 mg/dL   HDL 34 (L) >40 mg/dL   Total CHOL/HDL Ratio 3.7 RATIO   VLDL 25 0 - 40 mg/dL   LDL Cholesterol 68 0 - 99 mg/dL   Lab Results  Component Value Date   TSH 1.20 10/29/2016    Assessment & Plan:   Problem List Items Addressed This Visit     Chronic gout - Primary   Relevant Orders   Comprehensive metabolic panel   Uric acid   TSH   DG Wrist Complete Right   Other Visit Diagnoses     Right wrist pain       Relevant Orders   DG Wrist Complete Right        Meds ordered this encounter  Medications  . sildenafil (VIAGRA) 50 MG tablet    Sig: Take 1 tablet (50 mg total) by mouth daily as needed for erectile dysfunction.    Dispense:  10 tablet    Refill:  0  . allopurinol (ZYLOPRIM) 100 MG tablet    Sig: Take 2 tablets (200 mg total) by mouth daily.    Dispense:  60 tablet    Refill:  6    Orders Placed This Encounter  Procedures  . DG Wrist Complete Right    Standing Status:   Future    Number of Occurrences:   1    Standing Expiration Date:   03/31/2023    Order Specific Question:   Reason for Exam (SYMPTOM  OR DIAGNOSIS REQUIRED)    Answer:   R wrist with persistent limited ROM after gout flare    Order Specific Question:   Preferred imaging location?    Answer:   [facility]  . Comprehensive metabolic panel  . Uric acid  . TSH    Patient Instructions  Labs today - pending results we may increase allopurinol dose.   Xray today  Viagra Rx printed out, I recommend waiting for 6 months after 11/2021, touch base with cardiology prior to use.  Regarding weight loss, call insurance to see if Mancel Parsons is covered and let me know.   Follow up plan: Return in about 3 months (around 07/01/2022), or if symptoms worsen or fail to improve, for follow up visit.  Ria Bush, MD

## 2022-04-01 NOTE — Assessment & Plan Note (Signed)
Several flares to R wrist and dorsal hand after starting allopurinol and after recent radial catheterization.  Will update urate today, will likely increase allopurinol to '200mg'$  daily.  Discussed colchicine use.  Given ongoing pain, limitation, will update wrist films.

## 2022-04-13 ENCOUNTER — Telehealth: Payer: Self-pay | Admitting: Family Medicine

## 2022-04-13 NOTE — Telephone Encounter (Signed)
Patient called in to let DR G know that his insurance will cover wegovy,and could it be called in for him?  [place], [place] Phone: [phone]  Fax: [phone]

## 2022-04-14 MED ORDER — WEGOVY 0.25 MG/0.5ML ~~LOC~~ SOAJ: 0.2500 mg | SUBCUTANEOUS | 0 refills | Status: DC

## 2022-04-14 NOTE — Telephone Encounter (Signed)
Spoke with pt relaying Dr. Synthia Innocent message. Pt verbalizes understanding and will call back to schedule 4-6 wk wt mgmt f/u after starting med.

## 2022-04-14 NOTE — Telephone Encounter (Signed)
Plz notify I've sent wegovy to pharmacy for him. Schedule 4-6 wk f/u visit after starting.

## 2022-04-15 DIAGNOSIS — G4733 Obstructive sleep apnea (adult) (pediatric): Secondary | ICD-10-CM | POA: Diagnosis not present

## 2022-05-16 DIAGNOSIS — G4733 Obstructive sleep apnea (adult) (pediatric): Secondary | ICD-10-CM | POA: Diagnosis not present

## 2022-05-20 ENCOUNTER — Ambulatory Visit: Payer: BC Managed Care – PPO | Admitting: Family Medicine

## 2022-05-20 ENCOUNTER — Telehealth: Payer: Self-pay | Admitting: Family Medicine

## 2022-05-20 ENCOUNTER — Encounter: Payer: Self-pay | Admitting: Family Medicine

## 2022-05-20 VITALS — BP 120/78 | HR 74 | Temp 100.1°F | Ht 69.5 in | Wt 252.0 lb

## 2022-05-20 DIAGNOSIS — U071 COVID-19: Secondary | ICD-10-CM

## 2022-05-20 DIAGNOSIS — R509 Fever, unspecified: Secondary | ICD-10-CM

## 2022-05-20 HISTORY — DX: COVID-19: U07.1

## 2022-05-20 LAB — POC COVID19 BINAXNOW: SARS Coronavirus 2 Ag: POSITIVE — AB

## 2022-05-20 MED ORDER — NIRMATRELVIR/RITONAVIR (PAXLOVID)TABLET: 3.0000 | ORAL_TABLET | Freq: Two times a day (BID) | ORAL | 0 refills | Status: AC

## 2022-05-20 MED ORDER — DM-GUAIFENESIN ER 30-600 MG PO TB12: 2.0000 | ORAL_TABLET | Freq: Two times a day (BID) | ORAL | 0 refills | Status: DC | PRN

## 2022-05-20 MED ORDER — PROMETHAZINE-DM 6.25-15 MG/5ML PO SYRP: 5.0000 mL | ORAL_SOLUTION | Freq: Every evening | ORAL | 0 refills | Status: DC | PRN

## 2022-05-20 NOTE — Progress Notes (Signed)
Patient ID: Eric Huff, male    DOB: [birth year], 52 y.o.   MRN: [id number]  This visit was conducted in person.  BP 120/78 (BP Location: Left Arm, Patient Position: Sitting, Cuff Size: Large)   Pulse 74   Temp 100.1 F (37.8 C) (Temporal)   Ht 5' 9.5" (1.765 m)   Wt 252 lb (114.3 kg)   SpO2 96%   BMI 36.68 kg/m    CC:  Chief Complaint  Patient presents with   Cough    Fever, headache, nasal congestion and body aches since yesterday. Patient has been taking tylenol. Patient has not taken covid test; thinks sinus issues.     Subjective:   HPI: Eric Huff is a 52 y.o. male patient of Dr. Sharen Hones with history of hypertension, coronary artery disease and severe obesity presenting on 05/20/2022 for Cough (Fever, headache, nasal congestion and body aches since yesterday. Patient has been taking tylenol. Patient has not taken covid test; thinks sinus issues. )   Has been  dealing with gout Date of onset:  24 hours Initial symptoms included nasal congestion and headache, fever  101.7 F, body aches , Symptoms progressed to ear full ness np0 pain, productive cough.  No SOB.    Sick contacts:  wife, family in [place] COVID testing:   none     He has tried to treat with Tylenol     No history of chronic lung disease such as asthma or COPD.  Former smoker.       Relevant past medical, surgical, family and social history reviewed and updated as indicated. Interim medical history since our last visit reviewed. Allergies and medications reviewed and updated. Outpatient Medications Prior to Visit  Medication Sig Dispense Refill   allopurinol (ZYLOPRIM) 100 MG tablet Take 2 tablets (200 mg total) by mouth daily. 60 tablet 6   aspirin EC 81 MG tablet Take 1 tablet (81 mg total) by mouth daily. Swallow whole.     atorvastatin (LIPITOR) 80 MG tablet Take 1 tablet (80 mg total) by mouth daily. 90 tablet 3   CALCIUM-MAGNESIUM-ZINC PO Take 3 tablets by mouth daily.      carvedilol (COREG) 3.125 MG tablet Take 1 tablet (3.125 mg total) by mouth 2 (two) times daily with a meal. 180 tablet 3   Cholecalciferol (VITAMIN D3) 125 MCG (5000 UT) CAPS Take 1 capsule by mouth daily.     colchicine 0.6 MG tablet Take one tablet twice daily for 7 days, then take one tablet daily 37 tablet 0   loratadine (CLARITIN) 10 MG tablet Take 10 mg by mouth daily.     losartan (COZAAR) 25 MG tablet Take 1 tablet (25 mg total) by mouth daily. 90 tablet 3   prasugrel (EFFIENT) 10 MG TABS tablet Take 1 tablet (10 mg total) by mouth daily. 30 tablet 11   sildenafil (VIAGRA) 50 MG tablet Take 1 tablet (50 mg total) by mouth daily as needed for erectile dysfunction. 10 tablet 0   Semaglutide-Weight Management (WEGOVY) 0.25 MG/0.5ML SOAJ Inject 0.25 mg into the skin once a week. (Patient not taking: Reported on 05/20/2022) 2 mL 0   No facility-administered medications prior to visit.     Per HPI unless specifically indicated in ROS section below Review of Systems  Constitutional:  Positive for fatigue and fever.  HENT:  Positive for sinus pressure. Negative for ear pain and sinus pain.   Eyes:  Negative for pain.  Respiratory:  Positive for cough.  Negative for shortness of breath.   Cardiovascular:  Negative for chest pain, palpitations and leg swelling.  Gastrointestinal:  Negative for abdominal pain.  Genitourinary:  Negative for dysuria.  Musculoskeletal:  Positive for myalgias. Negative for arthralgias.  Neurological:  Negative for syncope, light-headedness and headaches.  Psychiatric/Behavioral:  Negative for dysphoric mood.    Objective:  BP 120/78 (BP Location: Left Arm, Patient Position: Sitting, Cuff Size: Large)   Pulse 74   Temp 100.1 F (37.8 C) (Temporal)   Ht 5' 9.5" (1.765 m)   Wt 252 lb (114.3 kg)   SpO2 96%   BMI 36.68 kg/m   Wt Readings from Last 3 Encounters:  05/20/22 252 lb (114.3 kg)  03/31/22 247 lb 4 oz (112.2 kg)  03/26/22 248 lb 11.2 oz (112.8 kg)       Physical Exam Constitutional:      General: He is not in acute distress.    Appearance: Normal appearance. He is well-developed. He is ill-appearing. He is not toxic-appearing.  HENT:     Head: Normocephalic and atraumatic.     Right Ear: Hearing, tympanic membrane, ear canal and external ear normal. No tenderness. No foreign body. Tympanic membrane is not retracted or bulging.     Left Ear: Hearing, tympanic membrane, ear canal and external ear normal. No tenderness. No foreign body. Tympanic membrane is not retracted or bulging.     Nose: Congestion present. No mucosal edema or rhinorrhea.     Right Sinus: No maxillary sinus tenderness or frontal sinus tenderness.     Left Sinus: No maxillary sinus tenderness or frontal sinus tenderness.     Mouth/Throat:     Dentition: Normal dentition. No dental caries.     Pharynx: Uvula midline. Posterior oropharyngeal erythema present. No oropharyngeal exudate.     Tonsils: No tonsillar abscesses.  Eyes:     General: Lids are normal. Lids are everted, no foreign bodies appreciated.     Conjunctiva/sclera: Conjunctivae normal.     Pupils: Pupils are equal, round, and reactive to light.  Neck:     Thyroid: No thyroid mass or thyromegaly.     Vascular: No carotid bruit.     Trachea: Trachea and phonation normal.  Cardiovascular:     Rate and Rhythm: Normal rate and regular rhythm.     Pulses: Normal pulses.     Heart sounds: Normal heart sounds, S1 normal and S2 normal. No murmur heard.    No gallop.  Pulmonary:     Effort: Pulmonary effort is normal. No respiratory distress.     Breath sounds: Normal breath sounds. No wheezing, rhonchi or rales.  Abdominal:     General: Bowel sounds are normal.     Palpations: Abdomen is soft.     Tenderness: There is no abdominal tenderness. There is no guarding or rebound.     Hernia: No hernia is present.  Musculoskeletal:     Cervical back: Normal range of motion and neck supple.  Skin:     General: Skin is warm and dry.     Findings: No rash.  Neurological:     Mental Status: He is alert.     Deep Tendon Reflexes: Reflexes are normal and symmetric.  Psychiatric:        Speech: Speech normal.        Behavior: Behavior normal.        Judgment: Judgment normal.       Results for orders placed or performed in visit on  05/20/22  POC COVID-19  Result Value Ref Range   SARS Coronavirus 2 Ag Positive (A) Negative    Assessment and Plan  Fever, unspecified fever cause -     POC COVID-19 BinaxNow  COVID-19 Assessment & Plan: COVID19  Infection < 5 days from onset of symptoms in  vaccinated overweight individual with history of  CAD  No clear sign of bacterial infection at this time.   No SOB.  No red flags/need for ER visit or in-person exam at respiratory clinic at this time..    Pt higher risk for COVID complications given  CAD. GFR  >60 and  medication contraindications: hold colchicine and viagra  Start paxlovid 5 day course. Reviewed course of medication and side effect profile with patient in detail.   Symptomatic care with mucinex and cough suppressant at night. If SOB begins symptoms worsening.. have low threshold for in-person exam, if severe shortness of breath ER visit recommended.  Can monitor Oxygen saturation at home with home monitor if able to obtain.  Go to ER if O2 sat < 90% on room air.   Reviewed home care Recommended quarantine 5 days isolation recommended. Return to work day 6 and wear mask for 4 more days to complete 10 days. Provided info about prevention of spread of COVID 19.    Other orders -     nirmatrelvir/ritonavir; Take 3 tablets by mouth 2 (two) times daily for 5 days. Patient GFR is > 60.  HOLD COLCHICINE AND SILDENAFIL WHILE ON.  Dispense: 30 tablet; Refill: 0 -     Promethazine-DM; Take 5 mLs by mouth at bedtime as needed for cough.  Dispense: 118 mL; Refill: 0 -     DM-guaiFENesin ER; Take 2 tablets by mouth 2 (two) times daily  as needed for cough. Do not take PM dose if taking the prescription cough suppressant  Dispense: 14 tablet; Refill: 0    No follow-ups on file.   Kerby Nora, MD

## 2022-05-20 NOTE — Assessment & Plan Note (Signed)
COVID19  Infection < 5 days from onset of symptoms in  vaccinated overweight individual with history of  CAD  No clear sign of bacterial infection at this time.   No SOB.  No red flags/need for ER visit or in-person exam at respiratory clinic at this time..    Pt higher risk for COVID complications given  CAD. GFR  >60 and  medication contraindications: hold colchicine and viagra  Start paxlovid 5 day course. Reviewed course of medication and side effect profile with patient in detail.   Symptomatic care with mucinex and cough suppressant at night. If SOB begins symptoms worsening.. have low threshold for in-person exam, if severe shortness of breath ER visit recommended.  Can monitor Oxygen saturation at home with home monitor if able to obtain.  Go to ER if O2 sat < 90% on room air.   Reviewed home care Recommended quarantine 5 days isolation recommended. Return to work day 6 and wear mask for 4 more days to complete 10 days. Provided info about prevention of spread of COVID 19.

## 2022-05-20 NOTE — Telephone Encounter (Signed)
Pharmacy informed patient that he need a a prior authorization for medication nirmatrelvir/ritonavir (PAXLOVID) 20 x 150 MG & 10 x  TABS

## 2022-05-22 ENCOUNTER — Other Ambulatory Visit (HOSPITAL_COMMUNITY): Payer: Self-pay

## 2022-05-22 ENCOUNTER — Telehealth: Payer: Self-pay

## 2022-05-22 NOTE — Telephone Encounter (Signed)
PA submitted via CMM. Key: [id number]. Status pending.

## 2022-05-22 NOTE — Telephone Encounter (Signed)
Pharmacy Patient Advocate Encounter   Received notification that prior authorization for Paxlovid (150/100) 10 x 150 MG &10 x 100MG  tablets is required/requested.  Per Test Claim: PA required   PA submitted on 05/22/22 to (ins) [employer] via [employer] or [facility]) confirmation # [id number] Status is pending

## 2022-05-26 ENCOUNTER — Encounter: Payer: Self-pay | Admitting: Internal Medicine

## 2022-05-26 ENCOUNTER — Ambulatory Visit: Payer: BC Managed Care – PPO | Attending: Internal Medicine | Admitting: Internal Medicine

## 2022-05-26 VITALS — BP 135/83 | HR 66 | Resp 12 | Ht 68.75 in | Wt 253.0 lb

## 2022-05-26 DIAGNOSIS — N200 Calculus of kidney: Secondary | ICD-10-CM | POA: Diagnosis not present

## 2022-05-26 DIAGNOSIS — M1A09X0 Idiopathic chronic gout, multiple sites, without tophus (tophi): Secondary | ICD-10-CM

## 2022-05-26 MED ORDER — TRIAMCINOLONE ACETONIDE 40 MG/ML IJ SUSP
40.0000 mg | INTRAMUSCULAR | Status: AC | PRN
  Administered 2022-05-26: 40 mg via INTRA_ARTICULAR

## 2022-05-26 MED ORDER — LIDOCAINE HCL 1 % IJ SOLN
1.0000 mL | INTRAMUSCULAR | Status: AC | PRN
  Administered 2022-05-26: 1 mL

## 2022-05-26 NOTE — Telephone Encounter (Signed)
Pharmacy Patient Advocate Encounter  Received notification from [facility] that the request for prior authorization for  Paxlovid has been denied due to Lack of documentation. Per denial letter, documentation that the member is experiencing a repeat diagnosis of Covid unrelated to the initial diagnosis of Covid which was treated with Paxlovid must be provided.     Denial letter attached to charts

## 2022-05-26 NOTE — Telephone Encounter (Signed)
PA denied. Routed telephone encounter for PA to pool

## 2022-05-26 NOTE — Progress Notes (Signed)
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 Patient states he cannot fully straighten out his arms. Patient states he has never had a gout flare in his wrist until after his stent surgery. Patient states he gets gout flare up in his feet.)   History of Present Illness: Eric Huff is a 52 y.o. male here for right wrist pain and swelling. Symptoms particularly since NSTEMI and associated PCI via radial artery catheterization last November. 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 He has previous history of gout for about 7 years with intermittent flares in his feet and rarely in his elbows.  Since this started he has flare up every few weeks continuously and has missed a lot of work due to these attacks. He takes colchicine as needed for flares and usually clear up within a few days. He was treated with a course of steroids at the outset with his cardiologist but not repeated within past few months. He has also noticed some increased pain in the right elbow and forearm that he thinks is tennis elbow. Currently has inflammation that increased since yesterday and progressively worsening. He just finished a course of paxlovid for COVID infection so was avoiding colchicine due to medication interaction.    Activities of Daily Living:  Patient reports morning stiffness for 5-10 minutes.   Patient Denies nocturnal pain.  Difficulty dressing/grooming: Reports Difficulty climbing stairs: Denies Difficulty getting out of chair: Denies Difficulty using  hands for taps, buttons, cutlery, and/or writing: Reports  Review of Systems  Constitutional:  Negative for fatigue.  HENT:  Negative for mouth sores and mouth dryness.   Eyes:  Negative for dryness.  Respiratory:  Negative for shortness of breath.   Cardiovascular:  Positive for palpitations. Negative for chest pain.  Gastrointestinal:  Negative for blood in stool, constipation and diarrhea.  Endocrine: Positive for increased urination.  Genitourinary:  Negative for involuntary urination.  Musculoskeletal:  Positive for joint pain, joint pain, joint swelling and morning stiffness. Negative for gait problem, myalgias, muscle weakness, muscle tenderness and myalgias.  Skin:  Positive for rash. Negative for color change, hair loss and sensitivity to sunlight.  Allergic/Immunologic: Negative for susceptible to infections.  Neurological:  Negative for dizziness and headaches.  Hematological:  Negative for swollen glands.  Psychiatric/Behavioral:  Negative for depressed mood and sleep disturbance. The patient is not nervous/anxious.     PMFS History:  Patient Active Problem List   Diagnosis Date Noted   COVID-19 05/20/2022   Lump in chest 01/01/2022   Tinnitus of both ears 12/27/2021   NSTEMI (non-ST elevated myocardial infarction) (HCC) 12/17/2021   Kidney stone 05/16/2020   Palpitations 05/16/2020   Tinea corporis 02/09/2020   Vitamin D deficiency 12/20/2019   Chronic gout 07/05/2019   Hypotestosteronism 11/02/2016   Encounter for general adult medical examination with abnormal findings 10/19/2014   Dyslipidemia 04/17/2014   Severe obesity (BMI 35.0-39.9) with comorbidity (HCC) 03/23/2014   Erectile dysfunction 03/23/2014  GERD 03/21/2008   Essential hypertension 05/06/2006   Prediabetes 05/06/2006    Past Medical History:  Diagnosis Date   CAD (coronary artery disease)    a. 11/2021 NSTEMI/PCI: LAD 30/27m, RI min irregs, LCX 95p/m (3.0x15 Onyx Frontier DES), 36m/d (2.5x22  Onyx Frontier DES), RCA 100p/m. RPDA fills via collats from dLAD. RPL3 fills via collats from OM3; b. 01/2022 ETT: Ex time 6:34. 1mm horizontal ST depression in V5, V6.  Recommend nuclear stress test.   Gout    History of echocardiogram    a. 11/2021 Echo: EF 55-60%, no rwma, nl RV fxn, triv MR.   Hypertension 12/26/2005   Morbid obesity (HCC)    Plantar fascia rupture 08/29/2010   Tobacco use    a. Smokes 1 cigar daily.    Family History  Problem Relation Age of Onset   Hypertension Father    CAD Maternal Grandfather 70       massive MI   CAD Paternal Grandfather        MI and CABG   Hypertension Paternal Grandmother    Diabetes Paternal Grandmother    Hypertension Cousin    Cancer Maternal Grandmother        breast   Cancer Other 5       niece with rhabdomyosarcoma   Past Surgical History:  Procedure Laterality Date   CORONARY STENT INTERVENTION N/A 12/19/2021   Procedure: CORONARY STENT INTERVENTION;  Surgeon: Iran Ouch, MD;  Location: [facility] INVASIVE CV LAB;  Service: Cardiovascular;  Laterality: N/A;   KNEE ARTHROSCOPY     Left and right acl    LEFT HEART CATH AND CORONARY ANGIOGRAPHY N/A 12/19/2021   Procedure: LEFT HEART CATH AND CORONARY ANGIOGRAPHY;  Surgeon: Iran Ouch, MD;  Location: [facility] INVASIVE CV LAB;  Service: Cardiovascular;  Laterality: N/A;   ORIF ELBOW FRACTURE     left   ORIF FEMUR FRACTURE     left   REPLACEMENT TOTAL KNEE     left   SHOULDER SURGERY  2003   Social History   Social History Narrative   "[name]"   Lives with wife, youngest daughter, 2 dogs, bearded dragon   Occupation: Works as a Transport planner, second shift   Activity: no regular exercise, some yardwork    Diet: good water, fruits/vegetables daily, smaller portions more frequency   Immunization History  Administered Date(s) Administered   Td 01/26/1997, 01/27/2003   Tdap 11/10/2018   Zoster Recombinat (Shingrix) 12/24/2020, 03/13/2021     Objective: Vital Signs: BP  135/83 (BP Location: Right Arm, Patient Position: Sitting, Cuff Size: Normal)   Pulse 66   Resp 12   Ht 5' 8.75" (1.746 m)   Wt 253 lb (114.8 kg)   BMI 37.63 kg/m    Physical Exam Constitutional:      Appearance: He is obese.  Cardiovascular:     Rate and Rhythm: Normal rate and regular rhythm.  Pulmonary:     Effort: Pulmonary effort is normal.     Breath sounds: Normal breath sounds.  Musculoskeletal:     Right lower leg: No edema.     Left lower leg: No edema.  Skin:    Findings: No rash.  Neurological:     Mental Status: He is alert.  Psychiatric:        Mood and Affect: Mood normal.      Musculoskeletal Exam:  Shoulders full ROM no tenderness or swelling Elbows slightly limited extension ROM b/l with hard endpoint Right wrist  dorsal swelling and tenderness worse over radial half, flexor side with swelling and tenderness at FCR insertion, left wrist normal Fingers full ROM no tenderness or swelling Knees full ROM no tenderness or swelling Ankles full ROM no tenderness or swelling MTPs full ROM mild right 1st MTP tenderness   Investigation: No additional findings.  Imaging: No results found.  Recent Labs: Lab Results  Component Value Date   WBC 7.4 12/26/2021   HGB 15.4 12/26/2021   PLT 344 12/26/2021   NA 139 03/31/2022   K 4.4 03/31/2022   CL 102 03/31/2022   CO2 28 03/31/2022   GLUCOSE 95 03/31/2022   BUN 18 03/31/2022   CREATININE 1.04 03/31/2022   BILITOT 0.7 03/31/2022   ALKPHOS 93 03/31/2022   AST 32 03/31/2022   ALT 39 03/31/2022   PROT 6.8 03/31/2022   ALBUMIN 4.3 03/31/2022   CALCIUM 10.0 03/31/2022   GFRAA 97 02/15/2006    Speciality Comments: No specialty comments available.  Procedures:  Medium Joint Inj: R radiocarpal on 05/26/2022 9:55 AM Indications: pain and joint swelling Details: 27 G 1.5 in needle, dorsal approach Medications: 1 mL lidocaine 1 %; 40 mg triamcinolone acetonide 40 MG/ML Outcome: tolerated well, no immediate  complications Procedure, treatment alternatives, risks and benefits explained, specific risks discussed. Consent was given by the patient. Immediately prior to procedure a time out was called to verify the correct patient, procedure, equipment, support staff and site/side marked as required. Patient was prepped and draped in the usual sterile fashion.     Allergies: Patient has no known allergies.   Assessment / Plan:     Visit Diagnoses: Idiopathic chronic gout of multiple sites without tophus - Plan: Uric acid  Clinical picture appears consistent with chronic gout with no evidence of tophaceous disease but possible erosive changes at the wrist.  Symptoms increased since hospitalization with NSTEMI and catheterization in November.  Not sure if the increase in symptoms is related to underlying change in health status, medications, or provoked by additional trauma to the site.  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  Uric acid has remained consistently above goal so needs further titration of allopurinol as well as continuing lifestyle modification.  Will check uric acid level today.  If above 6.0 increase allopurinol to 300 mg daily.  Recommend resuming colchicine at 0.6 mg daily prophylaxis.  Steroid injection to the right wrist today and if symptoms not significantly improved in 2 to 3 days could prescribe short course of oral steroid taper.  Kidney stone  History of previous single kidney stone episode in 2022 not clear if calcium vs urate vs other.  Orders: Orders Placed This Encounter  Procedures   Medium Joint Inj   Uric acid   No orders of the defined types were placed in this encounter.    Follow-Up Instructions: Return in about 4 weeks (around 06/23/2022) for Gout on allopurinol/wrist inj f/u 75mo.   Fuller Plan, MD  Note - This record has been created using [employer].  Chart creation errors have been sought, but may  not always  have been located. Such creation errors do not reflect on  the standard of medical care.

## 2022-05-26 NOTE — Patient Instructions (Signed)

## 2022-05-26 NOTE — Telephone Encounter (Signed)
Patient now test date that Paxlovid is appropriate

## 2022-05-27 LAB — URIC ACID: Uric Acid, Serum: 6.2 mg/dL (ref 4.0–8.0)

## 2022-05-27 MED ORDER — ALLOPURINOL 300 MG PO TABS: 300.0000 mg | ORAL_TABLET | Freq: Every day | ORAL | 1 refills | Status: DC

## 2022-05-27 NOTE — Progress Notes (Signed)
Uric acid of 6.2 remains slightly above goal. Recommend increasing allopurinol to 300 mg daily I will send a new Rx with the higher strength tablets.

## 2022-05-27 NOTE — Addendum Note (Signed)
Addended by: Fuller Plan on: 05/27/2022 10:19 AM   Modules accepted: Orders

## 2022-05-28 ENCOUNTER — Ambulatory Visit: Payer: BC Managed Care – PPO | Attending: Nurse Practitioner | Admitting: Nurse Practitioner

## 2022-05-28 ENCOUNTER — Encounter: Payer: Self-pay | Admitting: Nurse Practitioner

## 2022-05-28 VITALS — BP 130/78 | HR 69 | Ht 68.75 in | Wt 254.6 lb

## 2022-05-28 DIAGNOSIS — I25118 Atherosclerotic heart disease of native coronary artery with other forms of angina pectoris: Secondary | ICD-10-CM

## 2022-05-28 DIAGNOSIS — G4733 Obstructive sleep apnea (adult) (pediatric): Secondary | ICD-10-CM

## 2022-05-28 DIAGNOSIS — R002 Palpitations: Secondary | ICD-10-CM

## 2022-05-28 DIAGNOSIS — I10 Essential (primary) hypertension: Secondary | ICD-10-CM

## 2022-05-28 DIAGNOSIS — E785 Hyperlipidemia, unspecified: Secondary | ICD-10-CM

## 2022-05-28 DIAGNOSIS — I2089 Other forms of angina pectoris: Secondary | ICD-10-CM

## 2022-05-28 DIAGNOSIS — R9431 Abnormal electrocardiogram [ECG] [EKG]: Secondary | ICD-10-CM

## 2022-05-28 MED ORDER — NITROGLYCERIN 0.4 MG SL SUBL: 0.4000 mg | SUBLINGUAL_TABLET | SUBLINGUAL | 3 refills | Status: DC | PRN

## 2022-05-28 MED ORDER — ISOSORBIDE MONONITRATE ER 30 MG PO TB24: 30.0000 mg | ORAL_TABLET | Freq: Every day | ORAL | 3 refills | Status: DC

## 2022-05-28 NOTE — Patient Instructions (Addendum)
Medication Instructions:  - Your physician has recommended you make the following change in your medication:   1) START Imdur (Isosorbide) 30 mg: - take 1 tablet by mouth once daily   2) START Nitroglyerin 0.4 mg sublingual tablets: - If you experience chest pain: Place 1 nitroglycerin under your tongue, while sitting. If no relief of pain, may repeat 1 tablet every 5 minutes up to 3 tablets over 15 minutes. If no relief call 911. If you have dizziness/lightheadedness while taking nitroglycerin, stop taking and call 911.    *If you need a refill on your cardiac medications before your next appointment, please call your pharmacy*   Lab Work: - none ordered  If you have labs (blood work) drawn today and your tests are completely normal, you will receive your results only by: MyChart Message (if you have MyChart) OR A paper copy in the mail If you have any lab test that is abnormal or we need to change your treatment, we will call you to review the results.   Testing/Procedures: - none ordered   Follow-Up: At [facility], you and your health needs are our priority.  As part of our continuing mission to provide you with exceptional heart care, we have created designated Provider Care Teams.  These Care Teams include your primary Cardiologist (physician) and Advanced Practice Providers (APPs -  Physician Assistants and Nurse Practitioners) who all work together to provide you with the care you need, when you need it.  We recommend signing up for the patient portal called "MyChart".  Sign up information is provided on this After Visit Summary.  MyChart is used to connect with patients for Virtual Visits (Telemedicine).  Patients are able to view lab/test results, encounter notes, upcoming appointments, etc.  Non-urgent messages can be sent to your provider as well.   To learn more about what you can do with MyChart, go to [email or web address].    Your next appointment:   2  week(s)  Provider:   You may see [name], MD or one of the following Advanced Practice Providers on your designated Care Team:   Nicolasa Ducking, NP   Other Instructions  Isosorbide Mononitrate Extended-Release Tablets What is this medication? ISOSORBIDE MONONITRATE (eye soe SOR bide mon oh NYE trate) prevents chest pain (angina). It works by relaxing blood vessels, which decreases the amount of work the heart has to do. It belongs to a group of medications called nitrates. Do not use it to treat sudden chest pain. This medicine may be used for other purposes; ask your health care provider or pharmacist if you have questions. COMMON BRAND NAME(S): Imdur, Isotrate ER What should I tell my care team before I take this medication? They need to know if you have any of these conditions: Previous heart attack or heart failure An unusual or allergic reaction to isosorbide mononitrate, nitrates, other medications, foods, dyes, or preservatives Pregnant or trying to get pregnant Breast-feeding How should I use this medication? Take this medication by mouth with a glass of water. Follow the directions on the prescription label. Do not crush or chew. Take your medication at regular intervals. Do not take your medication more often than directed. Do not stop taking this medication except on the advice of your care team. Talk to your care team about the use of this medication in children. Special care may be needed. Overdosage: If you think you have taken too much of this medicine contact a poison control center or emergency  room at once. NOTE: This medicine is only for you. Do not share this medicine with others. What if I miss a dose? If you miss a dose, take it as soon as you can. If it is almost time for your next dose, take only that dose. Do not take double or extra doses. What may interact with this medication? Do not take this medication with any of the following: Certain medications  for erectile dysfunction (ED), such as avanafil, sildenafil, tadalafil, or vardenafil Riociguat This medication may also interact with the following: Medications for high blood pressure Other medications for angina or heart failure This list may not describe all possible interactions. Give your health care provider a list of all the medicines, herbs, non-prescription drugs, or dietary supplements you use. Also tell them if you smoke, drink alcohol, or use illegal drugs. Some items may interact with your medicine. What should I watch for while using this medication? Visit your care team for regular checks on your progress. Check your heart rate and blood pressure as directed. Know what your heart rate and blood pressure should be and when to contact your care team. Tell your care team if you feel your medication is no longer working. This medication may affect your coordination, reaction time, or judgment. Do not drive or operate machinery until you know how this medication affects you. Sit up or stand slowly to reduce the risk of dizzy or fainting spells. Drinking alcohol with this medication can increase the risk of these side effects Do not treat yourself for coughs, colds, or pain while you are taking this medication without asking your care team for advice. Some medications may increase your blood pressure. What side effects may I notice from receiving this medication? Side effects that you should report to your care team as soon as possible: Allergic reactions--skin rash, itching, hives, swelling of the face, lips, tongue, or throat Headache, unusual weakness or fatigue, shortness of breath, nausea, vomiting, rapid heartbeat, blue skin or lips, which may be signs of methemoglobinemia Increased pressure around the brain--severe headache, blurry vision, change in vision, nausea, vomiting Low blood pressure--dizziness, feeling faint or lightheaded, blurry vision Slow heartbeat--dizziness, feeling  faint or lightheaded, confusion, trouble breathing, unusual weakness or fatigue Worsening chest pain (angina)--pain, pressure, or tightness in the chest, neck, back, or arms Side effects that usually do not require medical attention (report to your care team if they continue or are bothersome): Dizziness Flushing Headache This list may not describe all possible side effects. Call your doctor for medical advice about side effects. You may report side effects to FDA at 1-800-FDA-1088. Where should I keep my medication? Keep out of the reach of children. Store between 15 and 30 degrees C (59 and 86 degrees F). Keep container tightly closed. Throw away any unused medication after the expiration date. NOTE: This sheet is a summary. It may not cover all possible information. If you have questions about this medicine, talk to your doctor, pharmacist, or health care provider.  2023 Elsevier/Gold Standard (2020-04-29 00:00:00)

## 2022-05-28 NOTE — Progress Notes (Signed)
Office Visit    Patient Name: Eric Huff Date of Encounter: 05/28/2022  Primary Care Provider:  Eustaquio Boyden, MD Primary Cardiologist:  [name], MD  Chief Complaint    52 year old male with a history of CAD s/p NSTEMI and PCI, HTN, HL, tob abuse, gout, and obesity, who presents for chest pain f/u.  Past Medical History    Past Medical History:  Diagnosis Date   CAD (coronary artery disease)    a. 11/2021 NSTEMI/PCI: LAD 30/62m, RI min irregs, LCX 95p/m (3.0x15 Onyx Frontier DES), 50m/d (2.5x22 Onyx Frontier DES), RCA 100p/m. RPDA fills via collats from dLAD. RPL3 fills via collats from OM3; b. 01/2022 ETT: Ex time 6:34. 1mm horizontal ST depression in V5, V6.  Rec nuc stress test; c. 02/2022 MV: EF 53%, mid-basal inflat HK. Inf ischemia - felt to be 2/2 CTO RCA w/ L->R  collats.   Gout    History of echocardiogram    a. 11/2021 Echo: EF 55-60%, no rwma, nl RV fxn, triv MR.   Hyperlipidemia LDL goal <70    Hypertension 12/26/2005   Morbid obesity (HCC)    Plantar fascia rupture 08/29/2010   Tobacco use    a. Smokes 1 cigar daily.   Past Surgical History:  Procedure Laterality Date   CORONARY STENT INTERVENTION N/A 12/19/2021   Procedure: CORONARY STENT INTERVENTION;  Surgeon: Iran Ouch, MD;  Location: [facility] INVASIVE CV LAB;  Service: Cardiovascular;  Laterality: N/A;   KNEE ARTHROSCOPY     Left and right acl    LEFT HEART CATH AND CORONARY ANGIOGRAPHY N/A 12/19/2021   Procedure: LEFT HEART CATH AND CORONARY ANGIOGRAPHY;  Surgeon: Iran Ouch, MD;  Location: [facility] INVASIVE CV LAB;  Service: Cardiovascular;  Laterality: N/A;   ORIF ELBOW FRACTURE     left   ORIF FEMUR FRACTURE     left   REPLACEMENT TOTAL KNEE     left   SHOULDER SURGERY  2003    Allergies  No Known Allergies  History of Present Illness    52 year old male with above past medical history including CAD, hypertension, hyperlipidemia, tobacco abuse, gout, and obesity.  He  was admitted in November 2023 in the setting of non-STEMI.  Echo showed an EF of 55 to 60% without regional wall motion abnormalities.  Diagnostic catheterization showed severe two-vessel CAD with chronically occluded right coronary artery with well-developed left-to-right collaterals, as well as severe left circumflex stenosis, which was felt to be the culprit.  The LAD had mild to moderate nonobstructive disease.  The proximal and distal left circumflex was treated with nonoverlapping drug-eluting stents (2).  Postprocedure, he had severe right wrist pain and swelling.  He was treated with colchicine and prednisone, and referred to rheumatology.  Earlier this year, patient underwent exercise treadmill test in January, showing reasonable exercise tolerance (6: 34), with 1 mm horizontal ST segment depression in leads V5 and V6.  Recommendation was made for nuclear stress testing, which was subsequently carried out showing mid to basal inferolateral hypokinesis with an EF of 53%, and inferior ischemia which was felt to be secondary to chronically occluded right coronary artery with left-to-right collaterals.  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  He initially returned to work and was doing well.  About 2 weeks ago, he came down with a sinus infection which was ultimately determined to be COVID-19.  He was treated conservatively and  has since recovered.  He just returned to work last night and noted that while unloading his truck, he experienced left chest tightness with mild dyspnea and shoulder discomfort (chronic left shoulder pain).  Symptoms did not slow him down after about 20 minutes, they resolved completely without having to stop and rest.  Similar symptoms occurred at a truck stop (7 in all) however, similar to the first episode, never had to stop doing what he was doing or reduce his level of exertion prior to symptoms resolving.  He has  not had any symptoms this morning.  EKG is unremarkable.  He otherwise denies dyspnea exertion, PND, orthopnea, dizziness, syncope, edema, or early satiety.  He sometimes notes brief palpitations-he skipped beat here there, with weeks and sometimes in between symptoms.  He was recently diagnosed with sleep apnea has been using CPAP, and sleeping much better.  His blood pressure has been running higher in the evenings, typically in the 140s to 150s.  Home Medications    Current Outpatient Medications  Medication Sig Dispense Refill   allopurinol (ZYLOPRIM) 300 MG tablet Take 1 tablet (300 mg total) by mouth daily. 30 tablet 1   aspirin EC 81 MG tablet Take 1 tablet (81 mg total) by mouth daily. Swallow whole.     atorvastatin (LIPITOR) 80 MG tablet Take 1 tablet (80 mg total) by mouth daily. 90 tablet 3   CALCIUM-MAGNESIUM-ZINC PO Take 3 tablets by mouth daily.     carvedilol (COREG) 3.125 MG tablet Take 1 tablet (3.125 mg total) by mouth 2 (two) times daily with a meal. 180 tablet 3   Cholecalciferol (VITAMIN D3) 125 MCG (5000 UT) CAPS Take 1 capsule by mouth daily.     colchicine 0.6 MG tablet Take one tablet twice daily for 7 days, then take one tablet daily 37 tablet 0   dextromethorphan-guaiFENesin (MUCINEX DM) 30-600 MG 12hr tablet Take 2 tablets by mouth 2 (two) times daily as needed for cough. Do not take PM dose if taking the prescription cough suppressant 14 tablet 0   loratadine (CLARITIN) 10 MG tablet Take 10 mg by mouth daily.     losartan (COZAAR) 25 MG tablet Take 1 tablet (25 mg total) by mouth daily. 90 tablet 3   prasugrel (EFFIENT) 10 MG TABS tablet Take 1 tablet (10 mg total) by mouth daily. 30 tablet 11   promethazine-dextromethorphan (PROMETHAZINE-DM) 6.25-15 MG/5ML syrup Take 5 mLs by mouth at bedtime as needed for cough. 118 mL 0   sildenafil (VIAGRA) 50 MG tablet Take 1 tablet (50 mg total) by mouth daily as needed for erectile dysfunction. 10 tablet 0   No current  facility-administered medications for this visit.     Review of Systems    He had episodic exertional chest pain and dyspnea last night as outlined above.  Occasional brief palpitation or skipped beat.  Blood pressure is higher at night.  Recently diagnosed with sleep apnea tolerating CPAP.  He denies PND, orthopnea, dizziness, syncope, edema, or early satiety.  All other systems reviewed and are otherwise negative except as noted above.    Physical Exam    VS:  BP 130/78   Pulse 69   Ht 5' 8.75" (1.746 m)   Wt 254 lb 9.6 oz (115.5 kg)   SpO2 96%   BMI 37.87 kg/m  , BMI Body mass index is 37.87 kg/m.     GEN: Well nourished, well developed, in no acute distress. HEENT: normal. Neck: Supple, no JVD, carotid  bruits, or masses. Cardiac: RRR, no murmurs, rubs, or gallops. No clubbing, cyanosis, edema.  Radials 2+/PT 2+ and equal bilaterally.  Respiratory:  Respirations regular and unlabored, clear to auscultation bilaterally. GI: Soft, nontender, nondistended, BS + x 4. MS: no deformity or atrophy. Skin: warm and dry, no rash. Neuro:  Strength and sensation are intact. Psych: Normal affect.  Accessory Clinical Findings    ECG personally reviewed by me today -regular sinus rhythm, 60 QT of 488- no acute changes.  Lab Results  Component Value Date   WBC 7.4 12/26/2021   HGB 15.4 12/26/2021   HCT 47.2 12/26/2021   MCV 83.1 12/26/2021   PLT 344 12/26/2021   Lab Results  Component Value Date   CREATININE 1.04 03/31/2022   BUN 18 03/31/2022   NA 139 03/31/2022   K 4.4 03/31/2022   CL 102 03/31/2022   CO2 28 03/31/2022   Lab Results  Component Value Date   ALT 39 03/31/2022   AST 32 03/31/2022   ALKPHOS 93 03/31/2022   BILITOT 0.7 03/31/2022   Lab Results  Component Value Date   CHOL 127 02/27/2022   HDL 34 (L) 02/27/2022   LDLCALC 68 02/27/2022   LDLDIRECT 114.0 12/16/2021   TRIG 127 02/27/2022   CHOLHDL 3.7 02/27/2022    Lab Results  Component Value Date    HGBA1C 5.9 12/16/2021    Assessment & Plan    1.  Stable angina/coronary artery disease: Status post non-STEMI in November 2023 with finding of chronically occluded right coronary artery with left-to-right collaterals, as well as severe circumflex disease, treated with 2 drug-eluting stents.  Exercise treadmill test earlier this year with horizontal ST segment depression prompting Myoview in February, which showed inferior ischemia with normal LV function and mid to basal inferolateral hypokinesis.  Inferior ischemia felt to be secondary to chronic total occlusion of the right coronary artery with reliance on collateral blood flow.  Patient had been doing well but then had COVID starting about 2 weeks ago.  He returned to work last night and noted development of mild chest discomfort with associated dyspnea and left arm pain while at work however, symptoms resolved within about 20 minutes despite not having to reduce his level of effort.  His ECG today shows sinus rhythm without acute ST or T changes.  We agreed to proceed with initial medical management and I am adding Imdur 30 mg daily.  I have also sent in a prescription for sublingual nitroglycerin.  We discussed the importance of avoiding nitrates in the setting of sildenafil therapy, which he and his wife state they have not been using.  I will follow-up with him in 1 to 2 weeks to reevaluate symptoms.  He understands that if symptoms persist or worsen, we will need to strongly consider diagnostic catheterization.  Continue current doses of aspirin, statin, beta-blocker, ARB, and prasugrel.  2.  Essential hypertension: Blood pressure stable today however, he has been noticing elevated blood pressures prior to going to work at night.  He is currently on carvedilol 3.125 mg twice daily and losartan 25 mg daily.  As above, adding Imdur 30 mg daily for the management of angina.  Will continue to follow blood pressures and consider further titration of ARB  therapy if necessary.  3.  Hyperlipidemia: LDL of 68 in February with normal LFTs.  Continue statin therapy.  4.  Obstructive sleep apnea: Not using CPAP and sleeping much better.  5.  Palpitations: Occasional isolated skipped beats  or fluttering, without sustained symptoms.  Symptoms are relatively infrequent and often weeks apart.  We discussed potential for monitoring and agreed to hold off at this time.  Continue beta-blocker therapy.  6.  Prolonged QTc: QTc of 488, which is similar to what has been seen previously.  Normal electrolytes in March.  Avoid QT prolonging agents (promethazine on medicine list but not currently taking).  7.  Recent COVID-19 infection: Seems to be recovering well.  Unclear to what extent dyspnea and activity intolerance may be secondary to recent infection.  8.  Disposition: Follow-up in 1 to 2 weeks or sooner if necessary.  Nicolasa Ducking, NP 05/28/2022, 10:38 AM

## 2022-06-03 ENCOUNTER — Ambulatory Visit: Payer: BC Managed Care – PPO | Admitting: Family Medicine

## 2022-06-03 VITALS — BP 124/70 | HR 71 | Temp 97.7°F | Ht 62.0 in | Wt 249.5 lb

## 2022-06-03 DIAGNOSIS — N410 Acute prostatitis: Secondary | ICD-10-CM

## 2022-06-03 DIAGNOSIS — R300 Dysuria: Secondary | ICD-10-CM | POA: Diagnosis not present

## 2022-06-03 DIAGNOSIS — R52 Pain, unspecified: Secondary | ICD-10-CM

## 2022-06-03 DIAGNOSIS — R509 Fever, unspecified: Secondary | ICD-10-CM | POA: Insufficient documentation

## 2022-06-03 DIAGNOSIS — N390 Urinary tract infection, site not specified: Secondary | ICD-10-CM | POA: Diagnosis not present

## 2022-06-03 HISTORY — DX: Acute prostatitis: N41.0

## 2022-06-03 LAB — POC URINALSYSI DIPSTICK (AUTOMATED)
Bilirubin, UA: NEGATIVE
Glucose, UA: NEGATIVE
Ketones, UA: NEGATIVE
Nitrite, UA: NEGATIVE
Protein, UA: NEGATIVE
Spec Grav, UA: 1.01 (ref 1.010–1.025)
Urobilinogen, UA: 0.2 E.U./dL
pH, UA: 6 (ref 5.0–8.0)

## 2022-06-03 LAB — POC COVID19 BINAXNOW: SARS Coronavirus 2 Ag: NEGATIVE

## 2022-06-03 LAB — POCT INFLUENZA A/B
Influenza A, POC: NEGATIVE
Influenza B, POC: NEGATIVE

## 2022-06-03 MED ORDER — SULFAMETHOXAZOLE-TRIMETHOPRIM 800-160 MG PO TABS: 1.0000 | ORAL_TABLET | Freq: Two times a day (BID) | ORAL | 0 refills | Status: DC

## 2022-06-03 MED ORDER — CEFTRIAXONE SODIUM 1 G IJ SOLR
1.0000 g | Freq: Once | INTRAMUSCULAR | Status: AC
  Administered 2022-06-03: 1 g via INTRAMUSCULAR

## 2022-06-03 NOTE — Patient Instructions (Addendum)
Urine test suspicious for urine infection, with prostatits. Treat with rocephin 1gm today followed by antibiotic sent to pharmacy.  Take bactrim antibiotic twice daily for 14 days.  Let us know if ongoing or worsening symptoms despite treatment.

## 2022-06-03 NOTE — Assessment & Plan Note (Signed)
Anticipate acute prostatitis given discomfort with prostate exam, fever and other systemic symptoms. Rx rocephin IM 1gm today then start bactrim DS 1 tab BID x 14 days.  Update if ongoing symptoms to extend antibiotic course.  Pt agrees with plan.

## 2022-06-03 NOTE — Assessment & Plan Note (Signed)
Recent COVID treated with Paxlovid 2 wks ago. Rpt COVID swab today negative - this is not COVID relapse.  Continue tylenol PRN. See below.

## 2022-06-03 NOTE — Progress Notes (Signed)
Ph: [phone]       Fax: [phone]   Patient ID: Eric Huff, male    DOB: 05-23-1970, 52 y.o.   MRN: [id number]  This visit was conducted in person.  BP 124/70   Pulse 71   Temp 97.7 F (36.5 C) (Temporal)   Ht 5\' 2"  (1.575 m)   Wt 249 lb 8 oz (113.2 kg)   SpO2 96%   BMI 45.63 kg/m    CC: body aches, fever fatigue and headache as well as dysuria Subjective:   HPI: Eric Huff is a 52 y.o. male presenting on 06/03/2022 for Generalized Body Aches (C/o body aches, chills, fever- max 102.3, fatigue and HA. Sxs started 06/02/22. Also, c/o burning when urinating- also started 06/02/22. Pt accompanied by wife, Eric Huff. )   1d h/o fevers/chills, Tmax 102.3, body aches (knees, elbows, shoulders), fatigue and headache. Burning to penis with and without urination without discharge, weak stream, incomplete emptying, frequency. No rectal pain.  Treating with tylenol 1000mg  TID, last dose 10am.   No hematuria, abd or back pain, flank pain, nausea/vomiting, diarrhea, constipation, ST. No rash.  No recent tick bites.  H/o prostatitis remotely. No known h/o UTI.   No recent abx use.   Wife not sick.   Seen by Dr Ermalene Searing on 05/20/2022 with COVID infection treated with paxlovid antiviral course. Symptoms fully resolved.   Recent rheum visit allopurinol increased to 300mg  daily.      Relevant past medical, surgical, family and social history reviewed and updated as indicated. Interim medical history since our last visit reviewed. Allergies and medications reviewed and updated. Outpatient Medications Prior to Visit  Medication Sig Dispense Refill   allopurinol (ZYLOPRIM) 300 MG tablet Take 1 tablet (300 mg total) by mouth daily. 30 tablet 1   aspirin EC 81 MG tablet Take 1 tablet (81 mg total) by mouth daily. Swallow whole.     atorvastatin (LIPITOR) 80 MG tablet Take 1 tablet (80 mg total) by mouth daily. 90 tablet 3   CALCIUM-MAGNESIUM-ZINC PO Take 3 tablets by mouth  daily.     carvedilol (COREG) 3.125 MG tablet Take 1 tablet (3.125 mg total) by mouth 2 (two) times daily with a meal. 180 tablet 3   Cholecalciferol (VITAMIN D3) 125 MCG (5000 UT) CAPS Take 1 capsule by mouth daily.     colchicine 0.6 MG tablet Take one tablet twice daily for 7 days, then take one tablet daily 37 tablet 0   isosorbide mononitrate (IMDUR) 30 MG 24 hr tablet Take 1 tablet (30 mg total) by mouth daily. 30 tablet 3   loratadine (CLARITIN) 10 MG tablet Take 10 mg by mouth daily.     losartan (COZAAR) 25 MG tablet Take 1 tablet (25 mg total) by mouth daily. 90 tablet 3   nitroGLYCERIN (NITROSTAT) 0.4 MG SL tablet Place 1 tablet (0.4 mg total) under the tongue every 5 (five) minutes as needed for chest pain. 25 tablet 3   prasugrel (EFFIENT) 10 MG TABS tablet Take 1 tablet (10 mg total) by mouth daily. 30 tablet 11   sildenafil (VIAGRA) 50 MG tablet Take 1 tablet (50 mg total) by mouth daily as needed for erectile dysfunction. 10 tablet 0   dextromethorphan-guaiFENesin (MUCINEX DM) 30-600 MG 12hr tablet Take 2 tablets by mouth 2 (two) times daily as needed for cough. Do not take PM dose if taking the prescription cough suppressant 14 tablet 0   No facility-administered medications prior to visit.  Per HPI unless specifically indicated in ROS section below Review of Systems  Objective:  BP 124/70   Pulse 71   Temp 97.7 F (36.5 C) (Temporal)   Ht 5\' 2"  (1.575 m)   Wt 249 lb 8 oz (113.2 kg)   SpO2 96%   BMI 45.63 kg/m   Wt Readings from Last 3 Encounters:  06/03/22 249 lb 8 oz (113.2 kg)  05/28/22 254 lb 9.6 oz (115.5 kg)  05/26/22 253 lb (114.8 kg)      Physical Exam Vitals and nursing note reviewed.  Constitutional:      Appearance: Normal appearance. He is not ill-appearing.  HENT:     Head: Normocephalic and atraumatic.     Right Ear: Hearing, tympanic membrane, ear canal and external ear normal. There is no impacted cerumen.     Left Ear: Hearing, tympanic  membrane, ear canal and external ear normal. There is no impacted cerumen.     Nose: Nose normal. No mucosal edema, congestion or rhinorrhea.     Right Turbinates: Not enlarged or swollen.     Left Turbinates: Not enlarged or swollen.     Right Sinus: No maxillary sinus tenderness or frontal sinus tenderness.     Left Sinus: No maxillary sinus tenderness or frontal sinus tenderness.     Mouth/Throat:     Mouth: Mucous membranes are moist.     Pharynx: Oropharynx is clear. No oropharyngeal exudate or posterior oropharyngeal erythema.  Eyes:     Extraocular Movements: Extraocular movements intact.     Conjunctiva/sclera: Conjunctivae normal.     Pupils: Pupils are equal, round, and reactive to light.  Cardiovascular:     Rate and Rhythm: Normal rate and regular rhythm.     Pulses: Normal pulses.     Heart sounds: Normal heart sounds. No murmur heard. Pulmonary:     Effort: Pulmonary effort is normal. No respiratory distress.     Breath sounds: Normal breath sounds. No wheezing, rhonchi or rales.  Abdominal:     General: Bowel sounds are normal. There is no distension.     Palpations: Abdomen is soft. There is no mass.     Tenderness: There is no abdominal tenderness. There is no right CVA tenderness, left CVA tenderness, guarding or rebound.     Hernia: No hernia is present. There is no hernia in the left inguinal area or right inguinal area.  Genitourinary:    Pubic Area: No rash.      Penis: Normal.      Testes: Normal.     Epididymis:     Right: Normal.     Left: Normal.     Prostate: Tender. Not enlarged and no nodules present.     Rectum: Normal. No mass, tenderness, anal fissure, external hemorrhoid or internal hemorrhoid. Normal anal tone.  Musculoskeletal:     Cervical back: Normal range of motion and neck supple. No rigidity.     Right lower leg: No edema.     Left lower leg: No edema.  Lymphadenopathy:     Cervical: No cervical adenopathy.     Lower Body: No right  inguinal adenopathy. No left inguinal adenopathy.  Skin:    General: Skin is warm and dry.     Findings: No rash.  Neurological:     Mental Status: He is alert.  Psychiatric:        Mood and Affect: Mood normal.        Behavior: Behavior normal.  Results for orders placed or performed in visit on 06/03/22  POC COVID-19 BinaxNow  Result Value Ref Range   SARS Coronavirus 2 Ag Negative Negative  POCT Influenza A/B  Result Value Ref Range   Influenza A, POC Negative Negative   Influenza B, POC Negative Negative  POCT Urinalysis Dipstick (Automated)  Result Value Ref Range   Color, UA yellow    Clarity, UA cloudy    Glucose, UA Negative Negative   Bilirubin, UA negative    Ketones, UA negative    Spec Grav, UA 1.010 1.010 - 1.025   Blood, UA +/-    pH, UA 6.0 5.0 - 8.0   Protein, UA Negative Negative   Urobilinogen, UA 0.2 0.2 or 1.0 E.U./dL   Nitrite, UA negative    Leukocytes, UA Large (3+) (A) Negative   Assessment & Plan:   Problem List Items Addressed This Visit     Acute prostatitis - Primary    Anticipate acute prostatitis given discomfort with prostate exam, fever and other systemic symptoms. Rx rocephin IM 1gm today then start bactrim DS 1 tab BID x 14 days.  Update if ongoing symptoms to extend antibiotic course.  Pt agrees with plan.       Fever    Recent COVID treated with Paxlovid 2 wks ago. Rpt COVID swab today negative - this is not COVID relapse.  Continue tylenol PRN. See below.       Relevant Orders   Urine Culture   Other Visit Diagnoses     Body aches       Relevant Orders   POC COVID-19 BinaxNow (Completed)   POCT Influenza A/B (Completed)   Dysuria       Relevant Orders   POCT Urinalysis Dipstick (Automated) (Completed)   Urine Culture   Complicated UTI (urinary tract infection)       Relevant Medications   sulfamethoxazole-trimethoprim (BACTRIM DS) 800-160 MG tablet   cefTRIAXone (ROCEPHIN) injection 1 g (Completed)   Other  Relevant Orders   Urine Culture        Meds ordered this encounter  Medications   sulfamethoxazole-trimethoprim (BACTRIM DS) 800-160 MG tablet    Sig: Take 1 tablet by mouth 2 (two) times daily.    Dispense:  28 tablet    Refill:  0   cefTRIAXone (ROCEPHIN) injection 1 g    Orders Placed This Encounter  Procedures   Urine Culture   POC COVID-19 BinaxNow    Order Specific Question:   Previously tested for COVID-19    Answer:   Yes    Order Specific Question:   Resident in a congregate (group) care setting    Answer:   No    Order Specific Question:   Employed in healthcare setting    Answer:   No   POCT Influenza A/B   POCT Urinalysis Dipstick (Automated)    Patient Instructions  Urine test suspicious for urine infection, with prostatits. Treat with rocephin 1gm today followed by antibiotic sent to pharmacy.  Take bactrim antibiotic twice daily for 14 days.  Let us know if ongoing or worsening symptoms despite treatment.   Follow up plan: Return if symptoms worsen or fail to improve.  Eustaquio Boyden, MD

## 2022-06-06 LAB — URINE CULTURE
MICRO NUMBER:: 14930152
SPECIMEN QUALITY:: ADEQUATE

## 2022-06-11 ENCOUNTER — Ambulatory Visit: Payer: BC Managed Care – PPO | Attending: Nurse Practitioner | Admitting: Nurse Practitioner

## 2022-06-11 ENCOUNTER — Encounter: Payer: Self-pay | Admitting: Nurse Practitioner

## 2022-06-11 VITALS — BP 120/68 | HR 63 | Ht 69.0 in | Wt 248.4 lb

## 2022-06-11 DIAGNOSIS — G4733 Obstructive sleep apnea (adult) (pediatric): Secondary | ICD-10-CM

## 2022-06-11 DIAGNOSIS — E785 Hyperlipidemia, unspecified: Secondary | ICD-10-CM | POA: Diagnosis not present

## 2022-06-11 DIAGNOSIS — I10 Essential (primary) hypertension: Secondary | ICD-10-CM | POA: Diagnosis not present

## 2022-06-11 DIAGNOSIS — I2510 Atherosclerotic heart disease of native coronary artery without angina pectoris: Secondary | ICD-10-CM

## 2022-06-11 DIAGNOSIS — R9431 Abnormal electrocardiogram [ECG] [EKG]: Secondary | ICD-10-CM

## 2022-06-11 DIAGNOSIS — R002 Palpitations: Secondary | ICD-10-CM

## 2022-06-11 NOTE — Progress Notes (Signed)
Office Visit    Patient Name: Eric Huff Date of Encounter: 06/11/2022  Primary Care Provider:  Eustaquio Boyden, MD Primary Cardiologist:  [name], MD  Chief Complaint    52 year old male with history of CAD status post non-STEMI and PCI, hypertension, hyperlipidemia, tobacco abuse, gout, and obesity, who presents for follow-up related to chest pain.  Past Medical History    Past Medical History:  Diagnosis Date   CAD (coronary artery disease)    a. 11/2021 NSTEMI/PCI: LAD 30/65m, RI min irregs, LCX 95p/m (3.0x15 Onyx Frontier DES), 67m/d (2.5x22 Onyx Frontier DES), RCA 100p/m. RPDA fills via collats from dLAD. RPL3 fills via collats from OM3; b. 01/2022 ETT: Ex time 6:34. 1mm horizontal ST depression in V5, V6.  Rec nuc stress test; c. 02/2022 MV: EF 53%, mid-basal inflat HK. Inf ischemia - felt to be 2/2 CTO RCA w/ L->R  collats.   Gout    History of echocardiogram    a. 11/2021 Echo: EF 55-60%, no rwma, nl RV fxn, triv MR.   Hyperlipidemia LDL goal <70    Hypertension 12/26/2005   Morbid obesity (HCC)    Plantar fascia rupture 08/29/2010   Tobacco use    a. Smokes 1 cigar daily.   Past Surgical History:  Procedure Laterality Date   CORONARY STENT INTERVENTION N/A 12/19/2021   Procedure: CORONARY STENT INTERVENTION;  Surgeon: Iran Ouch, MD;  Location: [facility] INVASIVE CV [facility];  Service: Cardiovascular;  Laterality: N/A;   KNEE ARTHROSCOPY     Left and right acl    LEFT HEART CATH AND CORONARY ANGIOGRAPHY N/A 12/19/2021   Procedure: LEFT HEART CATH AND CORONARY ANGIOGRAPHY;  Surgeon: Iran Ouch, MD;  Location: [facility] INVASIVE CV [facility];  Service: Cardiovascular;  Laterality: N/A;   ORIF ELBOW FRACTURE     left   ORIF FEMUR FRACTURE     left   REPLACEMENT TOTAL KNEE     left   SHOULDER SURGERY  2003    Allergies  No Known Allergies  History of Present Illness    52 year old male with above past medical history including CAD, hypertension,  hyperlipidemia, tobacco abuse, GERD, and obesity.  In November 2023, he was admitted with non-STEMI.  Echo showed an EF of 55 to 60% without regional wall motion abnormalities.  Diagnostic cath showed severe two-vessel CAD with chronically occluded right coronary artery with well-developed left-to-right collaterals, as well as severe left circumflex stenosis, which was felt to be the culprit. The LAD had mild to moderate nonobstructive disease. The proximal and distal left circumflex was treated with nonoverlapping drug-eluting stents (2). Postprocedure, he had severe right wrist pain and swelling. He was treated with colchicine and prednisone, and referred to rheumatology.  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  At follow-up on May 28, 2022, patient reported recent relatively mild COVID infection.  Following his return to work the evening prior, he noted recurrent left chest tightness with mild dyspnea and shoulder discomfort in the setting of chronic left shoulder pain.  Symptoms were somewhat reminiscent of prior angina.  ECG was unremarkable.  We opted to add Imdur 30 mg daily with plan for follow-up today.  Since his last visit,  he has had no recurrent chest pain.  He has remained active at work and at home without symptoms or limitations.  Occasional brief palpitation.  He denies chest pain, dyspnea, pnd, orthopnea, n, v, dizziness, syncope, edema, weight gain, or early satiety.   Home Medications    Current Outpatient Medications  Medication Sig Dispense Refill   allopurinol (ZYLOPRIM) 300 MG tablet Take 1 tablet (300 mg total) by mouth daily. 30 tablet 1   aspirin EC 81  MG tablet Take 1 tablet (81 mg total) by mouth daily. Swallow whole.     atorvastatin (LIPITOR) 80 MG tablet Take 1 tablet (80 mg total) by mouth daily. 90 tablet 3   CALCIUM-MAGNESIUM-ZINC PO Take 3 tablets by mouth daily.     carvedilol (COREG) 3.125 MG tablet Take 1 tablet (3.125 mg total) by mouth 2 (two) times daily with a meal. 180 tablet 3   Cholecalciferol (VITAMIN D3) 125 MCG (5000 UT) CAPS Take 1 capsule by mouth daily.     colchicine 0.6 MG tablet Take one tablet twice daily for 7 days, then take one tablet daily 37 tablet 0   isosorbide mononitrate (IMDUR) 30 MG 24 hr tablet Take 1 tablet (30 mg total) by mouth daily. 30 tablet 3   loratadine (CLARITIN) 10 MG tablet Take 10 mg by mouth daily.     losartan (COZAAR) 25 MG tablet Take 1 tablet (25 mg total) by mouth daily. 90 tablet 3   nitroGLYCERIN (NITROSTAT) 0.4 MG SL tablet Place 1 tablet (0.4 mg total) under the tongue every 5 (five) minutes as needed for chest pain. 25 tablet 3   prasugrel (EFFIENT) 10 MG TABS tablet Take 1 tablet (10 mg total) by mouth daily. 30 tablet 11   sildenafil (VIAGRA) 50 MG tablet Take 1 tablet (50 mg total) by mouth daily as needed for erectile dysfunction. 10 tablet 0   sulfamethoxazole-trimethoprim (BACTRIM DS) 800-160 MG tablet Take 1 tablet by mouth 2 (two) times daily. 28 tablet 0   No current facility-administered medications for this visit.     Review of Systems    Occasional palpitations-brief he denies chest pain, dyspnea, pnd, orthopnea, n, v, dizziness, syncope, edema, weight gain, or early satiety.  All other systems reviewed and are otherwise negative except as noted above.    Physical Exam    VS:  BP 120/68 (BP Location: Left Arm, Patient Position: Sitting, Cuff Size: Normal)   Pulse 63   Ht 5\' 9"  (1.753 m)   Wt 248 lb 6.4 oz (112.7 kg)   SpO2 96%   BMI 36.68 kg/m  , BMI Body mass index is 36.68 kg/m.     GEN: Well nourished, well developed, in no acute distress. HEENT:  normal. Neck: Supple, no JVD, carotid bruits, or masses. Cardiac: RRR, no murmurs, rubs, or gallops. No clubbing, cyanosis, edema.  Radials 2+/PT 2+ and equal bilaterally.  Respiratory:  Respirations regular and unlabored, clear to auscultation bilaterally. GI: Soft, nontender, nondistended, BS + x 4. MS: no deformity or atrophy. Skin: warm and dry, no rash. Neuro:  Strength and sensation are intact. Psych: Normal affect.  Accessory Clinical Findings    ECG personally reviewed by me today - RSR, 63, inferior infarct, LVH, persistent anterolateral ST elevation dating back to 11/2021 - no acute changes.  Lab Results  Component Value Date   WBC 7.4 12/26/2021   HGB 15.4 12/26/2021   HCT 47.2 12/26/2021   MCV 83.1 12/26/2021   PLT 344 12/26/2021  Lab Results  Component Value Date   CREATININE 1.04 03/31/2022   BUN 18 03/31/2022   NA 139 03/31/2022   K 4.4 03/31/2022   CL 102 03/31/2022   CO2 28 03/31/2022   Lab Results  Component Value Date   ALT 39 03/31/2022   AST 32 03/31/2022   ALKPHOS 93 03/31/2022   BILITOT 0.7 03/31/2022   Lab Results  Component Value Date   CHOL 127 02/27/2022   HDL 34 (L) 02/27/2022   LDLCALC 68 02/27/2022   LDLDIRECT 114.0 12/16/2021   TRIG 127 02/27/2022   CHOLHDL 3.7 02/27/2022    Lab Results  Component Value Date   HGBA1C 5.9 12/16/2021    Assessment & Plan    1.  Coronary artery disease: Status post non-STEMI in November 2023 with finding of chronically occluded right coronary artery with left-to-right collaterals, as well as severe circumflex disease treated with 2 drug-eluting stents.  Exercise treadmill test earlier this year with horizontal ST segment depression in the setting of baseline ST segment abnormalities, followed by Myoview in February 2024 showing inferior ischemia with normal LV function mid to basal inferolateral hypokinesis.  Ischemia felt to be secondary to chronic total occlusion of the right coronary artery with  reliance on collateral blood flow.  At his visit earlier this month, he reported episodes of exertional chest and shoulder discomfort that occurred while at work, which he attributed to having missed work for greater than a week in the setting of COVID infection.  I added Imdur 30 mg daily and since then, he has had no recurrent symptoms.  He remains active without limitations.  ECG with inferior infarct and persistent mild anterolateral ST elevation, dating back to November.  We will continue his current medical regimen including aspirin, statin, beta-blocker, ARB, and prasugrel therapy.  Question if he could try coming off Imdur since symptoms have resolved.  I advised that he may see if symptoms return off Imdur with a low threshold to resume if so.  2.  Essential hypertension: Blood pressure stable on carvedilol, losartan, and nitrate therapy.  He will follow-up blood pressure if he decides to stop nitrate.  3.  Hyperlipidemia: LDL of 68 earlier this year with normal LFTs.  Continue statin therapy.  4.  Obstructive sleep apnea: Now using CPAP and sleeping much better.  5.  Palpitations: Occasional isolated skipped beats or fluttering without sustained symptoms.  He bought a Nutritional therapist but has not needed to use yet.  Continue beta-blocker therapy.  6.  Prolonged QTc: QTc stable at 461 ms.  7.  Disposition: Follow-up in 3 to 4 months or sooner if necessary.   Nicolasa Ducking, NP 06/11/2022, 3:16 PM

## 2022-06-11 NOTE — Patient Instructions (Signed)
Medication Instructions:  Your Physician recommend you continue on your current medication as directed.     *If you need a refill on your cardiac medications before your next appointment, please call your pharmacy*   Lab Work: No labs ordered.  Testing/Procedures: No test ordered today.    Follow-Up: At [facility], you and your health needs are our priority.  As part of our continuing mission to provide you with exceptional heart care, we have created designated Provider Care Teams.  These Care Teams include your primary Cardiologist (physician) and Advanced Practice Providers (APPs -  Physician Assistants and Nurse Practitioners) who all work together to provide you with the care you need, when you need it.  We recommend signing up for the patient portal called "MyChart".  Sign up information is provided on this After Visit Summary.  MyChart is used to connect with patients for Virtual Visits (Telemedicine).  Patients are able to view lab/test results, encounter notes, upcoming appointments, etc.  Non-urgent messages can be sent to your provider as well.   To learn more about what you can do with MyChart, go to [email or web address].    Your next appointment:   3 month(s)  Provider:   You may see [name], MD or one of the following Advanced Practice Providers on your designated Care Team:   Nicolasa Ducking, NP

## 2022-06-15 ENCOUNTER — Other Ambulatory Visit (HOSPITAL_COMMUNITY): Payer: Self-pay

## 2022-06-15 DIAGNOSIS — G4733 Obstructive sleep apnea (adult) (pediatric): Secondary | ICD-10-CM | POA: Diagnosis not present

## 2022-06-16 DIAGNOSIS — G4733 Obstructive sleep apnea (adult) (pediatric): Secondary | ICD-10-CM | POA: Diagnosis not present

## 2022-06-23 ENCOUNTER — Encounter: Payer: Self-pay | Admitting: Internal Medicine

## 2022-06-23 ENCOUNTER — Ambulatory Visit: Payer: BC Managed Care – PPO | Attending: Internal Medicine | Admitting: Internal Medicine

## 2022-06-23 VITALS — BP 131/83 | HR 55 | Resp 14 | Ht 69.0 in | Wt 253.0 lb

## 2022-06-23 DIAGNOSIS — M1A09X0 Idiopathic chronic gout, multiple sites, without tophus (tophi): Secondary | ICD-10-CM

## 2022-06-23 DIAGNOSIS — N410 Acute prostatitis: Secondary | ICD-10-CM

## 2022-06-23 MED ORDER — ALLOPURINOL 300 MG PO TABS: 300.0000 mg | ORAL_TABLET | Freq: Every day | ORAL | 3 refills | Status: DC

## 2022-06-23 MED ORDER — COLCHICINE 0.6 MG PO TABS: 0.6000 mg | ORAL_TABLET | Freq: Every day | ORAL | 0 refills | Status: DC

## 2022-06-23 NOTE — Progress Notes (Signed)
Office Visit Note  Patient: Eric Huff             Date of Birth: 1970/12/13           MRN: [id number]             PCP: Eustaquio Boyden, MD Referring: Eustaquio Boyden, MD Visit Date: 06/23/2022   Subjective:  Follow-up (Patient states he would like Dr. Dimple Casey to take over the Colchicine. )   History of Present Illness: Eric Huff is a 52 y.o. male here for follow up for chronic gouty arthritis doing well today the previous right wrist inflammation started improving 1 day after the injection at our last visit.  He is taking the allopurinol 300 mg once daily has not noticed any problems.  Also taking the colchicine as once daily prophylaxis now states he will need medication refills sooner with his adjustment.  He was treated with a course of Bactrim for acute prostatitis.  He has not had any history of previous frequent genitourinary infections.  Previous HPI 05/26/22 Eric Huff is a 52 y.o. male here for right wrist pain and swelling. Symptoms particularly since NSTEMI and associated PCI via radial artery catheterization last November. 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 He has previous history of gout for about 7 years with intermittent flares in his feet and rarely in his elbows.  Since this started he has flare up every few weeks continuously and has missed a lot of work due to these attacks. He takes colchicine as needed for flares and usually clear up within a few days. He was treated with a course of steroids at the outset with his cardiologist but not repeated within past few months. He has also noticed some increased pain in the right elbow and forearm that he thinks is tennis elbow. Currently has inflammation that increased since yesterday and progressively worsening. He just finished a course of paxlovid for COVID  infection so was avoiding colchicine due to medication interaction.   Review of Systems  Constitutional:  Negative for fatigue.  HENT:  Negative for mouth sores and mouth dryness.   Eyes:  Negative for dryness.  Respiratory:  Negative for shortness of breath.   Cardiovascular:  Negative for chest pain and palpitations.  Gastrointestinal:  Negative for blood in stool, constipation and diarrhea.  Endocrine: Negative for increased urination.  Genitourinary:  Negative for involuntary urination.  Musculoskeletal:  Positive for morning stiffness. Negative for joint pain, gait problem, joint pain, joint swelling, myalgias, muscle weakness, muscle tenderness and myalgias.  Skin:  Negative for color change, rash, hair loss and sensitivity to sunlight.  Allergic/Immunologic: Negative for susceptible to infections.  Neurological:  Negative for dizziness and headaches.  Hematological:  Negative for swollen glands.  Psychiatric/Behavioral:  Negative for depressed mood and sleep disturbance. The patient is not nervous/anxious.     PMFS History:  Patient Active Problem List   Diagnosis Date Noted   Acute prostatitis 06/03/2022   Fever 06/03/2022   COVID-19 05/20/2022   Lump in chest 01/01/2022   Tinnitus of both ears 12/27/2021   NSTEMI (non-ST elevated myocardial infarction) (HCC) 12/17/2021   Kidney stone 05/16/2020   Palpitations 05/16/2020   Tinea corporis 02/09/2020   Vitamin D deficiency 12/20/2019   Chronic gout 07/05/2019   Hypotestosteronism 11/02/2016   Encounter for general adult medical examination with abnormal findings  10/19/2014   Dyslipidemia 04/17/2014   Severe obesity (BMI 35.0-39.9) with comorbidity (HCC) 03/23/2014   Erectile dysfunction 03/23/2014   GERD 03/21/2008   Essential hypertension 05/06/2006   Prediabetes 05/06/2006    Past Medical History:  Diagnosis Date   CAD (coronary artery disease)    a. 11/2021 NSTEMI/PCI: LAD 30/39m, RI min irregs, LCX 95p/m  (3.0x15 Onyx Frontier DES), 72m/d (2.5x22 Onyx Frontier DES), RCA 100p/m. RPDA fills via collats from dLAD. RPL3 fills via collats from OM3; b. 01/2022 ETT: Ex time 6:34. 1mm horizontal ST depression in V5, V6.  Rec nuc stress test; c. 02/2022 MV: EF 53%, mid-basal inflat HK. Inf ischemia - felt to be 2/2 CTO RCA w/ L->R  collats.   Gout    History of echocardiogram    a. 11/2021 Echo: EF 55-60%, no rwma, nl RV fxn, triv MR.   Hyperlipidemia LDL goal <70    Hypertension 12/26/2005   Morbid obesity (HCC)    Plantar fascia rupture 08/29/2010   Tobacco use    a. Smokes 1 cigar daily.    Family History  Problem Relation Age of Onset   Hypertension Father    CAD Maternal Grandfather 53       massive MI   CAD Paternal Grandfather        MI and CABG   Hypertension Paternal Grandmother    Diabetes Paternal Grandmother    Hypertension Cousin    Cancer Maternal Grandmother        breast   Cancer Other 5       niece with rhabdomyosarcoma   Past Surgical History:  Procedure Laterality Date   CORONARY STENT INTERVENTION N/A 12/19/2021   Procedure: CORONARY STENT INTERVENTION;  Surgeon: Iran Ouch, MD;  Location: [facility] INVASIVE CV [facility];  Service: Cardiovascular;  Laterality: N/A;   KNEE ARTHROSCOPY     Left and right acl    LEFT HEART CATH AND CORONARY ANGIOGRAPHY N/A 12/19/2021   Procedure: LEFT HEART CATH AND CORONARY ANGIOGRAPHY;  Surgeon: Iran Ouch, MD;  Location: [facility] INV[facility] CV [facility];  Service: Cardiovascular;  Laterality: N/A;   ORIF ELBOW FRACTURE     left   ORIF FEMUR FRACTURE     left   REPLACEMENT TOTAL KNEE     left   SHOULDER SURGERY  2003   Social History   Social History Narrative   "[name]"   Lives with wife, youngest daughter, 2 dogs, bearded dragon   Occupation: Works as a Transport planner, second shift   Activity: no regular exercise, some yardwork    Diet: good water, fruits/vegetables daily, smaller portions more frequency   Immunization History   Administered Date(s) Administered   Td 01/26/1997, 01/27/2003   Tdap 11/10/2018   Zoster Recombinat (Shingrix) 12/24/2020, 03/13/2021     Objective: Vital Signs: BP 131/83 (BP Location: Left Arm, Patient Position: Sitting, Cuff Size: Normal)   Pulse (!) 55   Resp 14   Ht 5\' 9"  (1.753 m)   Wt 253 lb (114.8 kg)   BMI 37.36 kg/m    Physical Exam Constitutional:      Appearance: He is obese.  Cardiovascular:     Rate and Rhythm: Normal rate and regular rhythm.  Pulmonary:     Effort: Pulmonary effort is normal.     Breath sounds: Normal breath sounds.  Skin:    General: Skin is warm and dry.     Findings: No rash.  Neurological:     Mental Status: He is  alert.  Psychiatric:        Mood and Affect: Mood normal.      Musculoskeletal Exam:  Shoulders full ROM no tenderness or swelling Elbows full ROM no tenderness or swelling Right wrist slightly restricted range of motion no palpable synovitis or tenderness to pressure Fingers full ROM no tenderness or swelling Knees full ROM no tenderness or swelling Ankles full ROM no tenderness or swelling Some tenderness at right first MTP joint no palpable effusion or erythema   Investigation: No additional findings.  Imaging: No results found.  Recent Labs: Lab Results  Component Value Date   WBC 5.8 06/23/2022   HGB 13.0 (L) 06/23/2022   PLT 298 06/23/2022   NA 141 06/23/2022   K 4.8 06/23/2022   CL 105 06/23/2022   CO2 24 06/23/2022   GLUCOSE 102 (H) 06/23/2022   BUN 15 06/23/2022   CREATININE 1.01 06/23/2022   BILITOT 0.5 06/23/2022   ALKPHOS 93 03/31/2022   AST 41 (H) 06/23/2022   ALT 63 (H) 06/23/2022   PROT 6.6 06/23/2022   ALBUMIN 4.3 03/31/2022   CALCIUM 9.8 06/23/2022   GFRAA 97 02/15/2006    Speciality Comments: No specialty comments available.  Procedures:  No procedures performed Allergies: Patient has no known allergies.   Assessment / Plan:     Visit Diagnoses: Idiopathic chronic gout of  multiple sites without tophus - Plan: COMPLETE METABOLIC PANEL WITH GFR, Uric acid, CBC with Differential/Platelet, allopurinol (ZYLOPRIM) 300 MG tablet, colchicine 0.6 MG tablet  Gouty arthritis symptoms much better now after steroid injection and on prophylactic colchicine.  Will recheck uric acid level now on the 300 mg dose of allopurinol.  Also recheck blood count and metabolic panel now on daily colchicine regimen.  If he remains above goal will titrate up otherwise continue at the current dose.  I recommend continuing prophylactic colchicine every day for up to 3 months as he remains at risk of flareup then can convert to as needed.  Orders: Orders Placed This Encounter  Procedures   COMPLETE METABOLIC PANEL WITH GFR   Uric acid   CBC with Differential/Platelet   Meds ordered this encounter  Medications   allopurinol (ZYLOPRIM) 300 MG tablet    Sig: Take 1 tablet (300 mg total) by mouth daily.    Dispense:  90 tablet    Refill:  3   colchicine 0.6 MG tablet    Sig: Take 1 tablet (0.6 mg total) by mouth daily.    Dispense:  90 tablet    Refill:  0     Follow-Up Instructions: Return in about 3 months (around 09/23/2022) for Gout on allopurinol/colchicine f/u 3mos.   Fuller Plan, MD  Note - This record has been created using [employer].  Chart creation errors have been sought, but may not always  have been located. Such creation errors do not reflect on  the standard of medical care.

## 2022-06-24 LAB — CBC WITH DIFFERENTIAL/PLATELET
Absolute Monocytes: 644 cells/uL (ref 200–950)
Basophils Absolute: 70 cells/uL (ref 0–200)
Basophils Relative: 1.2 %
Eosinophils Absolute: 354 cells/uL (ref 15–500)
Eosinophils Relative: 6.1 %
HCT: 39.2 % (ref 38.5–50.0)
Hemoglobin: 13 g/dL — ABNORMAL LOW (ref 13.2–17.1)
Lymphs Abs: 2401 cells/uL (ref 850–3900)
MCH: 27.7 pg (ref 27.0–33.0)
MCHC: 33.2 g/dL (ref 32.0–36.0)
MCV: 83.6 fL (ref 80.0–100.0)
MPV: 9.9 fL (ref 7.5–12.5)
Monocytes Relative: 11.1 %
Neutro Abs: 2332 cells/uL (ref 1500–7800)
Neutrophils Relative %: 40.2 %
Platelets: 298 10*3/uL (ref 140–400)
RBC: 4.69 10*6/uL (ref 4.20–5.80)
RDW: 13.6 % (ref 11.0–15.0)
Total Lymphocyte: 41.4 %
WBC: 5.8 10*3/uL (ref 3.8–10.8)

## 2022-06-24 LAB — COMPLETE METABOLIC PANEL WITH GFR
AG Ratio: 1.9 (calc) (ref 1.0–2.5)
ALT: 63 U/L — ABNORMAL HIGH (ref 9–46)
AST: 41 U/L — ABNORMAL HIGH (ref 10–35)
Albumin: 4.3 g/dL (ref 3.6–5.1)
Alkaline phosphatase (APISO): 100 U/L (ref 35–144)
BUN: 15 mg/dL (ref 7–25)
CO2: 24 mmol/L (ref 20–32)
Calcium: 9.8 mg/dL (ref 8.6–10.3)
Chloride: 105 mmol/L (ref 98–110)
Creat: 1.01 mg/dL (ref 0.70–1.30)
Globulin: 2.3 g/dL (calc) (ref 1.9–3.7)
Glucose, Bld: 102 mg/dL — ABNORMAL HIGH (ref 65–99)
Potassium: 4.8 mmol/L (ref 3.5–5.3)
Sodium: 141 mmol/L (ref 135–146)
Total Bilirubin: 0.5 mg/dL (ref 0.2–1.2)
Total Protein: 6.6 g/dL (ref 6.1–8.1)
eGFR: 89 mL/min/{1.73_m2} (ref >=60)

## 2022-06-24 LAB — URIC ACID: Uric Acid, Serum: 5.3 mg/dL (ref 4.0–8.0)

## 2022-06-24 NOTE — Progress Notes (Signed)
Uric acid improved to 5.3 so he can continue on the allopurinol 300 mg daily as planned. His liver enzyme numbers increased by a small amount with AST of 41 and ALT of 63. This can be a side effect of colchicine especially when taking along with some other medications. Although his numbers were also higher a few months ago after starting the high dose statin so may be unrelated. He should be fine to continue the plan since we would be stopping the every day colchicine in a few months anyways.

## 2022-06-30 ENCOUNTER — Ambulatory Visit: Payer: BC Managed Care – PPO | Admitting: Family Medicine

## 2022-06-30 ENCOUNTER — Encounter: Payer: Self-pay | Admitting: Family Medicine

## 2022-06-30 DIAGNOSIS — I10 Essential (primary) hypertension: Secondary | ICD-10-CM | POA: Diagnosis not present

## 2022-06-30 DIAGNOSIS — G4733 Obstructive sleep apnea (adult) (pediatric): Secondary | ICD-10-CM | POA: Insufficient documentation

## 2022-06-30 DIAGNOSIS — I25118 Atherosclerotic heart disease of native coronary artery with other forms of angina pectoris: Secondary | ICD-10-CM

## 2022-06-30 DIAGNOSIS — M1A09X0 Idiopathic chronic gout, multiple sites, without tophus (tophi): Secondary | ICD-10-CM | POA: Diagnosis not present

## 2022-06-30 DIAGNOSIS — Z6836 Body mass index (BMI) 36.0-36.9, adult: Secondary | ICD-10-CM

## 2022-06-30 DIAGNOSIS — R7401 Elevation of levels of liver transaminase levels: Secondary | ICD-10-CM

## 2022-06-30 NOTE — Patient Instructions (Addendum)
We will submit Wegovy prior authorization to see if insurance will cover this.  Continue current medicines.  Schedule 6-8 wk follow up after starting [facility].

## 2022-06-30 NOTE — Progress Notes (Signed)
Ph: [phone] Fax: [phone]   Patient ID: Eric Huff, male    DOB: [birth year], 52 y.o.   MRN: [id number]  This visit was conducted in person.  BP 122/76   Pulse 63   Temp (!) 97.3 F (36.3 C) (Temporal)   Ht 5\' 9"  (1.753 m)   Wt 250 lb (113.4 kg)   SpO2 96%   BMI 36.92 kg/m    CC: 3 mo f/u visit  Subjective:   HPI: Eric Huff is a 52 y.o. male presenting on 06/30/2022 for Medical Management of Chronic Issues (Here for 3 mo f/u. Pt accompanied by wife, Candance. )   COVID infection 04/2022 treated with Paxlovid followed by prostatitis treated with rocephin 1gm IM followed by 2 wk bactrim DS course. Urinary symptoms are fully resolved.  Lab Results  Component Value Date   PSA 0.59 12/16/2021   PSA 0.65 12/17/2020   PSA 0.91 12/15/2019    CAD s/p NSTEMI with chronically occluded RCA and severe Circ disease s/p 2 DES 11/2021, followed regularly by cardiology. Chest discomfort improved with addition of imdur 30mg  daily.    HTN - continues carvedilol 3.125mg  bid, losartan 25mg  daily, imdur 30mg  daily. No HA, vision changes, CP/tightness, SOB, leg swelling.   OSA - regular CPAP use every night  Gout followed by rheumatology on ppx colchicine 0.6mg  daily. Also on allopurinol 300mg  daily. Lab Results  Component Value Date   LABURIC 5.3 06/23/2022   Obesity - interested in starting Wegovy. Found out insurance covers this with PA. Request submitted. No fmhx MTC or MEN2.       Relevant past medical, surgical, family and social history reviewed and updated as indicated. Interim medical history since our last visit reviewed. Allergies and medications reviewed and updated. Outpatient Medications Prior to Visit  Medication Sig Dispense Refill   allopurinol (ZYLOPRIM) 300 MG tablet Take 1 tablet (300 mg total) by mouth daily. 90 tablet 3   aspirin EC 81 MG tablet Take 1 tablet (81 mg total) by mouth daily. Swallow whole.     atorvastatin (LIPITOR) 80 MG tablet  Take 1 tablet (80 mg total) by mouth daily. 90 tablet 3   CALCIUM-MAGNESIUM-ZINC PO Take 3 tablets by mouth daily.     carvedilol (COREG) 3.125 MG tablet Take 1 tablet (3.125 mg total) by mouth 2 (two) times daily with a meal. 180 tablet 3   Cholecalciferol (VITAMIN D3) 125 MCG (5000 UT) CAPS Take 1 capsule by mouth daily.     colchicine 0.6 MG tablet Take 1 tablet (0.6 mg total) by mouth daily. 90 tablet 0   isosorbide mononitrate (IMDUR) 30 MG 24 hr tablet Take 1 tablet (30 mg total) by mouth daily. 30 tablet 3   loratadine (CLARITIN) 10 MG tablet Take 10 mg by mouth daily.     losartan (COZAAR) 25 MG tablet Take 1 tablet (25 mg total) by mouth daily. 90 tablet 3   nitroGLYCERIN (NITROSTAT) 0.4 MG SL tablet Place 1 tablet (0.4 mg total) under the tongue every 5 (five) minutes as needed for chest pain. 25 tablet 3   prasugrel (EFFIENT) 10 MG TABS tablet Take 1 tablet (10 mg total) by mouth daily. 30 tablet 11   sildenafil (VIAGRA) 50 MG tablet Take 1 tablet (50 mg total) by mouth daily as needed for erectile dysfunction. 10 tablet 0   No facility-administered medications prior to visit.     Per HPI unless specifically indicated in ROS section below Review of  Systems  Objective:  BP 122/76   Pulse 63   Temp (!) 97.3 F (36.3 C) (Temporal)   Ht 5\' 9"  (1.753 m)   Wt 250 lb (113.4 kg)   SpO2 96%   BMI 36.92 kg/m   Wt Readings from Last 3 Encounters:  06/30/22 250 lb (113.4 kg)  06/23/22 253 lb (114.8 kg)  06/11/22 248 lb 6.4 oz (112.7 kg)      Physical Exam Vitals and nursing note reviewed.  Constitutional:      Appearance: Normal appearance. He is not ill-appearing.  HENT:     Mouth/Throat:     Mouth: Mucous membranes are moist.     Pharynx: Oropharynx is clear. No oropharyngeal exudate or posterior oropharyngeal erythema.  Neck:     Thyroid: No thyroid mass or thyromegaly.  Cardiovascular:     Rate and Rhythm: Normal rate and regular rhythm.     Pulses: Normal pulses.      Heart sounds: Normal heart sounds. No murmur heard. Pulmonary:     Effort: Pulmonary effort is normal. No respiratory distress.     Breath sounds: Normal breath sounds. No wheezing, rhonchi or rales.  Musculoskeletal:     Cervical back: Normal range of motion and neck supple.     Right lower leg: No edema.     Left lower leg: No edema.  Neurological:     Mental Status: He is alert.  Psychiatric:        Mood and Affect: Mood normal.        Behavior: Behavior normal.       Results for orders placed or performed in visit on 06/23/22  COMPLETE METABOLIC PANEL WITH GFR  Result Value Ref Range   Glucose, Bld 102 (H) 65 - 99 mg/dL   BUN 15 7 - 25 mg/dL   Creat 1.61 0.96 - 0.45 mg/dL   eGFR 89 > OR = 60 WU/JWJ/1.91Y7   BUN/Creatinine Ratio SEE NOTE: 6 - 22 (calc)   Sodium 141 135 - 146 mmol/L   Potassium 4.8 3.5 - 5.3 mmol/L   Chloride 105 98 - 110 mmol/L   CO2 24 20 - 32 mmol/L   Calcium 9.8 8.6 - 10.3 mg/dL   Total Protein 6.6 6.1 - 8.1 g/dL   Albumin 4.3 3.6 - 5.1 g/dL   Globulin 2.3 1.9 - 3.7 g/dL (calc)   AG Ratio 1.9 1.0 - 2.5 (calc)   Total Bilirubin 0.5 0.2 - 1.2 mg/dL   Alkaline phosphatase (APISO) 100 35 - 144 U/L   AST 41 (H) 10 - 35 U/L   ALT 63 (H) 9 - 46 U/L  Uric acid  Result Value Ref Range   Uric Acid, Serum 5.3 4.0 - 8.0 mg/dL  CBC with Differential/Platelet  Result Value Ref Range   WBC 5.8 3.8 - 10.8 Thousand/uL   RBC 4.69 4.20 - 5.80 Million/uL   Hemoglobin 13.0 (L) 13.2 - 17.1 g/dL   HCT 82.9 56.2 - 13.0 %   MCV 83.6 80.0 - 100.0 fL   MCH 27.7 27.0 - 33.0 pg   MCHC 33.2 32.0 - 36.0 g/dL   RDW 86.5 78.4 - 69.6 %   Platelets 298 140 - 400 Thousand/uL   MPV 9.9 7.5 - 12.5 fL   Neutro Abs 2,332 1,500 - 7,800 cells/uL   Lymphs Abs 2,401 850 - 3,900 cells/uL   Absolute Monocytes 644 200 - 950 cells/uL   Eosinophils Absolute 354 15 - 500 cells/uL   Basophils Absolute  70 0 - 200 cells/uL   Neutrophils Relative % 40.2 %   Total Lymphocyte 41.4 %    Monocytes Relative 11.1 %   Eosinophils Relative 6.1 %   Basophils Relative 1.2 %    Assessment & Plan:   Problem List Items Addressed This Visit     Essential hypertension    Chronic, stable on current regimen.       Severe obesity (BMI 35.0-39.9) with comorbidity (HCC) - Primary    Interested in [place].Reviewed mechanism of action of medication as well as side effects and adverse events to watch for including nausea, diarrhea, constipation, pancreatitis. No fmhx medullary thyroid cancer. Discussed titration schedule for medication. Will start 0.25mg  weekly x 1 mo then increase to 0.5mg  weekly. Discussed need for regular visits for weight management to monitor medication effect and tolerance and weight loss, rec return 1-2 months after starting medication.        Relevant Medications   Semaglutide-Weight Management 0.25 MG/0.5ML SOAJ   Semaglutide-Weight Management 0.5 MG/0.5ML SOAJ (Start on 07/29/2022)   Chronic gout    Appreciate rheum care.       CAD (coronary artery disease)    Appreciate cardiology care.  Received 2 stents 11/2021.  Continues aspirin and Effient.       OSA on CPAP    Continues regular CPAP use.       Transaminitis    Mild, will continue to monitor.  ?high intensity statin related Reassess off colchicine.         Meds ordered this encounter  Medications   Semaglutide-Weight Management 0.25 MG/0.5ML SOAJ    Sig: Inject 0.25 mg into the skin once a week.    Dispense:  2 mL    Refill:  0   Semaglutide-Weight Management 0.5 MG/0.5ML SOAJ    Sig: Inject 0.5 mg into the skin once a week.    Dispense:  2 mL    Refill:  3    No orders of the defined types were placed in this encounter.   Patient Instructions  We will submit Wegovy prior authorization to see if insurance will cover this.  Continue current medicines.  Schedule 6-8 wk follow up after starting [facility].   Follow up plan: Return if symptoms worsen or fail to improve.  Eustaquio Boyden, MD

## 2022-07-01 ENCOUNTER — Telehealth: Payer: Self-pay

## 2022-07-01 ENCOUNTER — Other Ambulatory Visit (HOSPITAL_COMMUNITY): Payer: Self-pay

## 2022-07-01 ENCOUNTER — Encounter: Payer: Self-pay | Admitting: Family Medicine

## 2022-07-01 ENCOUNTER — Telehealth: Payer: Self-pay | Admitting: Family Medicine

## 2022-07-01 DIAGNOSIS — R7401 Elevation of levels of liver transaminase levels: Secondary | ICD-10-CM | POA: Insufficient documentation

## 2022-07-01 MED ORDER — SEMAGLUTIDE-WEIGHT MANAGEMENT 0.5 MG/0.5ML ~~LOC~~ SOAJ: 0.5000 mg | SUBCUTANEOUS | 3 refills | Status: DC

## 2022-07-01 MED ORDER — SEMAGLUTIDE-WEIGHT MANAGEMENT 0.25 MG/0.5ML ~~LOC~~ SOAJ: 0.2500 mg | SUBCUTANEOUS | 0 refills | Status: DC

## 2022-07-01 NOTE — Telephone Encounter (Signed)
PA submitted via CMM. Created new encounter for PA. Will route back to pool once determination has been made. 

## 2022-07-01 NOTE — Telephone Encounter (Signed)
Can we send to pharmacy team to do prior auth for [facility]? Indication NSTEMI (I21.4) and severe obesity BMI 35-39 (E66.01).

## 2022-07-01 NOTE — Assessment & Plan Note (Signed)
Continues regular CPAP use.  

## 2022-07-01 NOTE — Telephone Encounter (Signed)
Pharmacy Patient Advocate Encounter   Received notification from [facility] that prior authorization for Wegovy 0.25MG /0.5ML auto-injectors is required/requested.   PA submitted to  [facility]  via CoverMyMeds Key or [facility]) confirmation # [id number] Status is pending

## 2022-07-01 NOTE — Assessment & Plan Note (Addendum)
Interested in [place].Reviewed mechanism of action of medication as well as side effects and adverse events to watch for including nausea, diarrhea, constipation, pancreatitis. No fmhx medullary thyroid cancer. Discussed titration schedule for medication. Will start 0.25mg  weekly x 1 mo then increase to 0.5mg  weekly. Discussed need for regular visits for weight management to monitor medication effect and tolerance and weight loss, rec return 1-2 months after starting medication.

## 2022-07-01 NOTE — Assessment & Plan Note (Signed)
Chronic, stable on current regimen.  

## 2022-07-01 NOTE — Assessment & Plan Note (Addendum)
Appreciate cardiology care.  Received 2 stents 11/2021.  Continues aspirin and Effient.

## 2022-07-01 NOTE — Assessment & Plan Note (Signed)
Appreciate rheum care.  

## 2022-07-01 NOTE — Telephone Encounter (Signed)
Noted. (See other 07/01/22 phn note.)

## 2022-07-01 NOTE — Assessment & Plan Note (Addendum)
Mild, will continue to monitor.  ?high intensity statin related Reassess off colchicine.

## 2022-07-03 NOTE — Telephone Encounter (Signed)
Patient Advocate Encounter  Prior Authorization for [employer] 0.25MG /0.5ML auto-injectors has been approved.    PA# [id number] [employer] Electronic PA Form Effective dates: 07/01/2022 through 12/31/2022

## 2022-07-04 ENCOUNTER — Ambulatory Visit
Admission: RE | Admit: 2022-07-04 | Discharge: 2022-07-04 | Disposition: A | Discharge status: Home / Self Care | Payer: BC Managed Care – PPO | Source: Ambulatory Visit | Attending: Urgent Care | Admitting: Urgent Care

## 2022-07-04 ENCOUNTER — Ambulatory Visit (INDEPENDENT_AMBULATORY_CARE_PROVIDER_SITE_OTHER): Payer: Worker's Compensation

## 2022-07-04 VITALS — BP 129/81 | HR 66 | Temp 98.1°F | Resp 16

## 2022-07-04 DIAGNOSIS — M25512 Pain in left shoulder: Secondary | ICD-10-CM

## 2022-07-04 DIAGNOSIS — M19012 Primary osteoarthritis, left shoulder: Secondary | ICD-10-CM | POA: Diagnosis not present

## 2022-07-04 NOTE — ED Provider Notes (Signed)
[facility]    CSN: [id number] Arrival date & time: 07/04/22  1051      History   Chief Complaint Chief Complaint  Patient presents with   Shoulder Injury    Entered by patient    HPI Eric Huff is a 52 y.o. male.    Shoulder Injury    Patient presents to urgent care after a fall at work 3 days ago where he landed on his left shoulder patient states he is unable to lift his arm above his head without pain.  Patient states he was standing on a pallet and in the process of stepping off when his foot got caught in the pallet and he fell backward onto his back.  He is not certain of how he landed but endorses pain in the shoulder, especially when he moves or rotates the shoulder.  He endorses inability to lift his arm past 90 degrees without significant pain.  States he feels popping and grinding with certain movements.  Denies extremity numbness or tingling.  Past Medical History:  Diagnosis Date   Acute prostatitis 06/03/2022   UCx 05/2022  - pansensitive E coli   CAD (coronary artery disease)    a. 11/2021 NSTEMI/PCI: LAD 30/58m, RI min irregs, LCX 95p/m (3.0x15 Onyx Frontier DES), 69m/d (2.5x22 Onyx Frontier DES), RCA 100p/m. RPDA fills via collats from dLAD. RPL3 fills via collats from OM3; b. 01/2022 ETT: Ex time 6:34. 1mm horizontal ST depression in V5, V6.  Rec nuc stress test; c. 02/2022 MV: EF 53%, mid-basal inflat HK. Inf ischemia - felt to be 2/2 CTO RCA w/ L->R  collats.   COVID-19 05/20/2022   Gout    History of echocardiogram    a. 11/2021 Echo: EF 55-60%, no rwma, nl RV fxn, triv MR.   Hyperlipidemia LDL goal <70    Hypertension 12/26/2005   Morbid obesity (HCC)    Plantar fascia rupture 08/29/2010   Tobacco use    a. Smokes 1 cigar daily.    Patient Active Problem List   Diagnosis Date Noted   Transaminitis 07/01/2022   OSA on CPAP 06/30/2022   Lump in chest 01/01/2022   Tinnitus of both ears 12/27/2021   CAD (coronary artery disease)  12/17/2021   Kidney stone 05/16/2020   Palpitations 05/16/2020   Tinea corporis 02/09/2020   Vitamin D deficiency 12/20/2019   Chronic gout 07/05/2019   Hypotestosteronism 11/02/2016   Encounter for general adult medical examination with abnormal findings 10/19/2014   Dyslipidemia 04/17/2014   Severe obesity (BMI 35.0-39.9) with comorbidity (HCC) 03/23/2014   Erectile dysfunction 03/23/2014   GERD 03/21/2008   Essential hypertension 05/06/2006   Prediabetes 05/06/2006    Past Surgical History:  Procedure Laterality Date   CORONARY STENT INTERVENTION N/A 12/19/2021   Procedure: CORONARY STENT INTERVENTION;  Surgeon: Iran Ouch, MD;  Location: [facility];  Service: Cardiovascular;  Laterality: N/A;   KNEE ARTHROSCOPY     Left and right acl    LEFT HEART CATH AND CORONARY ANGIOGRAPHY N/A 12/19/2021   Procedure: LEFT HEART CATH AND CORONARY ANGIOGRAPHY;  Surgeon: Iran Ouch, MD;  Location: [facility];  Service: Cardiovascular;  Laterality: N/A;   ORIF ELBOW FRACTURE     left   ORIF FEMUR FRACTURE     left   REPLACEMENT TOTAL KNEE     left   SHOULDER SURGERY  2003       Home Medications    Prior to  Admission medications   Medication Sig Start Date End Date Taking? Authorizing Provider  allopurinol (ZYLOPRIM) 300 MG tablet Take 1 tablet (300 mg total) by mouth daily. 06/23/22   Fuller Plan, MD  aspirin EC 81 MG tablet Take 1 tablet (81 mg total) by mouth daily. Swallow whole. 12/19/21 12/19/22  Darlin Priestly, MD  atorvastatin (LIPITOR) 80 MG tablet Take 1 tablet (80 mg total) by mouth daily. 02/12/22 02/07/23  Iran Ouch, MD  CALCIUM-MAGNESIUM-ZINC PO Take 3 tablets by mouth daily.    [provider]  carvedilol (COREG) 3.125 MG tablet Take 1 tablet (3.125 mg total) by mouth 2 (two) times daily with a meal. 02/12/22 02/07/23  Iran Ouch, MD  Cholecalciferol (VITAMIN D3) 125 MCG (5000 UT) CAPS Take 1 capsule by mouth daily.     [provider]  colchicine 0.6 MG tablet Take 1 tablet (0.6 mg total) by mouth daily. 06/23/22   Fuller Plan, MD  isosorbide mononitrate (IMDUR) 30 MG 24 hr tablet Take 1 tablet (30 mg total) by mouth daily. 05/28/22 08/26/22  Creig Hines, NP  loratadine (CLARITIN) 10 MG tablet Take 10 mg by mouth daily.    [provider]  losartan (COZAAR) 25 MG tablet Take 1 tablet (25 mg total) by mouth daily. 02/12/22 02/07/23  Iran Ouch, MD  nitroGLYCERIN (NITROSTAT) 0.4 MG SL tablet Place 1 tablet (0.4 mg total) under the tongue every 5 (five) minutes as needed for chest pain. 05/28/22 08/26/22  Creig Hines, NP  prasugrel (EFFIENT) 10 MG TABS tablet Take 1 tablet (10 mg total) by mouth daily. 12/20/21 12/20/22  Darlin Priestly, MD  Semaglutide-Weight Management 0.25 MG/0.5ML SOAJ Inject 0.25 mg into the skin once a week. 07/01/22   Eustaquio Boyden, MD  Semaglutide-Weight Management 0.5 MG/0.5ML SOAJ Inject 0.5 mg into the skin once a week. 07/29/22   Eustaquio Boyden, MD  sildenafil (VIAGRA) 50 MG tablet Take 1 tablet (50 mg total) by mouth daily as needed for erectile dysfunction. 03/31/22   Eustaquio Boyden, MD    Family History Family History  Problem Relation Age of Onset   Hypertension Father    CAD Maternal Grandfather 92       massive MI   CAD Paternal Grandfather        MI and CABG   Hypertension Paternal Grandmother    Diabetes Paternal Grandmother    Hypertension Cousin    Cancer Maternal Grandmother        breast   Cancer Other 5       niece with rhabdomyosarcoma    Social History Social History   Tobacco Use   Smoking status: Former    Types: Cigarettes, Cigars    Quit date: 12/19/2021    Years since quitting: 0.5    Passive exposure: Past   Smokeless tobacco: Former   Tobacco comments:    Quit cigarettes in 2009.  Quit cigars in November 2023  Vaping Use   Vaping Use: Never used  Substance Use Topics   Alcohol use: No     Alcohol/week: 0.0 standard drinks of alcohol   Drug use: No     Allergies   Patient has no known allergies.   Review of Systems Review of Systems   Physical Exam Triage Vital Signs ED Triage Vitals  Enc Vitals Group     BP      Pulse      Resp      Temp  Temp src      SpO2      Weight      Height      Head Circumference      Peak Flow      Pain Score      Pain Loc      Pain Edu?      Excl. in GC?    No data found.  Updated Vital Signs There were no vitals taken for this visit.  Visual Acuity Right Eye Distance:   Left Eye Distance:   Bilateral Distance:    Right Eye Near:   Left Eye Near:    Bilateral Near:     Physical Exam Vitals reviewed.  Constitutional:      Appearance: Normal appearance.  Musculoskeletal:     Right shoulder: Tenderness and bony tenderness present. No deformity or crepitus. Decreased range of motion. Decreased strength.     Comments: No crepitus is noted by the provider during exam.  There is decreased range of motion, limited by pain.  There is some tenderness with palpation.  Skin:    General: Skin is warm and dry.  Neurological:     General: No focal deficit present.     Mental Status: He is alert and oriented to person, place, and time.  Psychiatric:        Mood and Affect: Mood normal.        Behavior: Behavior normal.      UC Treatments / Results  Labs (all labs ordered are listed, but only abnormal results are displayed) Labs Reviewed - No data to display  EKG   Radiology No results found.  Procedures Procedures (including critical care time)  Medications Ordered in UC Medications - No data to display  Initial Impression / Assessment and Plan / UC Course  I have reviewed the triage vital signs and the nursing notes.  Pertinent labs & imaging results that were available during my care of the patient were reviewed by me and considered in my medical decision making (see chart for details).   MORIO DUKART is a 52 y.o. male presenting with L shoulder pain following a fall from standing positive. Patient is afebrile without recent antipyretics, satting well on room air. Overall is well appearing, well hydrated, without respiratory distress.  No left shoulder crepitus is noted on exam.  There is limited range of motion and some tenderness with palpation at the left acromion.  No scapula or clavicular pain.  Reviewed relevant chart history. Additional history obtained from patient family/caregiver present during the exam.  Fall resulting in MSK injury.  Left shoulder x-ray is ordered to assess for skeletal defect.  Likely need for follow-up with orthopedic provider.  Impression:  1. No acute fracture or dislocation. 2. 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  Results communicated to patient plan telephone.  Final Clinical Impressions(s) / UC Diagnoses   Final diagnoses:  None   Discharge Instructions   None    ED Prescriptions   None    PDMP not reviewed this encounter.   Charma Igo, [place] 07/04/22 1257

## 2022-07-04 NOTE — ED Triage Notes (Signed)
Pt states he fell at work on Wednesday and landed on his left shoulder. Pt is not able to lift arm above head without pain. Pt stated it will may be a workers  comp but does not have paperwork yet but has talked with boss. I talked with pt stating we would need paperwork to have workers comp sent in and he states he will pay for the visit with his insurance and just give information from visit to boss and they will handle payment.

## 2022-07-04 NOTE — Discharge Instructions (Addendum)
Use Tylenol for pain relief as needed or topical analgesics such as diclofenac gel.  Recommend evaluation by an orthopedic provider with additional imaging especially if your pain persists.

## 2022-07-28 ENCOUNTER — Telehealth: Payer: Self-pay | Admitting: Family Medicine

## 2022-07-28 NOTE — Telephone Encounter (Signed)
Patient needs PA for  Semaglutide-Weight Management 0.5 MG/0.5ML SOAJ

## 2022-07-28 NOTE — Telephone Encounter (Signed)
Rx for 0.5 mg [facility] sent to [facility] on 07/01/22.   Spoke with pt notifying him of above info. Pt verbalizes understanding. States he will contact them to see if they have the 0.5 mg dose available. If not, he will have rx transferred to [facility].

## 2022-07-28 NOTE — Telephone Encounter (Signed)
Prescription Request  07/28/2022  LOV: 06/30/2022  What is the name of the medication or equipment? Semaglutide-Weight Management 0.25 MG/0.5ML SOAJ   Have you contacted your pharmacy to request a refill? Yes   Which pharmacy would you like this sent to?   [facility] [address] [facility] - [place], [place] - [address] AT [facility] [address] [place] [address] Phone: [phone] Fax: [phone]   Patient notified that their request is being sent to the clinical staff for review and that they should receive a response within 2 business days.   Please advise at Mobile [phone] (mobile)  Patient wanted Dr. Reece Agar to know he is doing well on this medication and has had no issues from side effects

## 2022-07-29 ENCOUNTER — Other Ambulatory Visit: Payer: Self-pay | Admitting: Family Medicine

## 2022-07-29 ENCOUNTER — Other Ambulatory Visit (HOSPITAL_COMMUNITY): Payer: Self-pay

## 2022-07-29 NOTE — Telephone Encounter (Signed)
Ran test claim, received paid claim. No additional PA needed.  PA Effective: 07/01/2022 through 12/31/2022

## 2022-08-21 ENCOUNTER — Ambulatory Visit: Payer: BC Managed Care – PPO | Admitting: Family Medicine

## 2022-08-21 ENCOUNTER — Encounter: Payer: Self-pay | Admitting: Family Medicine

## 2022-08-21 DIAGNOSIS — Z6836 Body mass index (BMI) 36.0-36.9, adult: Secondary | ICD-10-CM

## 2022-08-21 DIAGNOSIS — I10 Essential (primary) hypertension: Secondary | ICD-10-CM

## 2022-08-21 DIAGNOSIS — R7401 Elevation of levels of liver transaminase levels: Secondary | ICD-10-CM | POA: Diagnosis not present

## 2022-08-21 DIAGNOSIS — I25118 Atherosclerotic heart disease of native coronary artery with other forms of angina pectoris: Secondary | ICD-10-CM

## 2022-08-21 MED ORDER — SEMAGLUTIDE-WEIGHT MANAGEMENT 0.5 MG/0.5ML ~~LOC~~ SOAJ: 0.5000 mg | SUBCUTANEOUS | 1 refills | Status: DC

## 2022-08-21 NOTE — Patient Instructions (Addendum)
Continue wegovy 0.5mg  weekly.  Return in 6-8 weeks for follow up visit.

## 2022-08-21 NOTE — Progress Notes (Signed)
Ph: [phone] Fax: [phone]   Patient ID: Eric Huff, male    DOB: 1970-07-06, 52 y.o.   MRN: [id number]  This visit was conducted in person.  BP 134/80   Pulse 63   Temp (!) 97.4 F (36.3 C) (Temporal)   Ht 5\' 9"  (1.753 m)   Wt 247 lb 2 oz (112.1 kg)   SpO2 98%   BMI 36.49 kg/m    CC: weight management visit Subjective:   HPI: Eric Huff is a 52 y.o. male presenting on 08/21/2022 for Medical Management of Chronic Issues (Here for 6-8 wk wt mgmt f/u. Pt accompanied by wife, [name]. )   Had a fall at work - fell off pallet onto truck - injured left shoulder. H/o prior left shoulder injury with arthritis. Avoids NSAIDs because of CAD.   Starting weight: 250 lbs  Last weight: 250 lbs  Today's weight 247 lbs   Wegovy started 06/2022, tolerated 0.25mg  dose well, now on 0.5mg  dose. No nausea, diarrhea, constipation, or epigastric pain. No fmhx MTC or MEN2.   24 hour recall: 9:30am breakfast - [place] steak biscuit and hashbrown, water  11am lunch - peanut/chocolate cookie from [employer], water  7:30pm dinner - 1/2 sushi burger from a restaurant, water  8:30pm dinner - baked pork chop sandwich on honey wheat with mayo and ketchup, water  Normally has tangerines for snacks, not yesterday  Drinks pedialyte at work   Activity regimen: Completed cardiac rehab 02/2022.  Stays active at work   CAD s/p NSTEMI with chronically occluded RCA and severe Circ disease s/p 2 DES 11/2021, followed regularly by cardiology. Chest discomfort improved with addition of imdur 30mg  daily.   HTN - continues carvedilol, losartan, isosorbide.      Relevant past medical, surgical, family and social history reviewed and updated as indicated. Interim medical history since our last visit reviewed. Allergies and medications reviewed and updated. Outpatient Medications Prior to Visit  Medication Sig Dispense Refill   allopurinol (ZYLOPRIM) 300 MG tablet Take 1 tablet (300 mg  total) by mouth daily. 90 tablet 3   aspirin EC 81 MG tablet Take 1 tablet (81 mg total) by mouth daily. Swallow whole.     atorvastatin (LIPITOR) 80 MG tablet Take 1 tablet (80 mg total) by mouth daily. 90 tablet 3   CALCIUM-MAGNESIUM-ZINC PO Take 3 tablets by mouth daily.     carvedilol (COREG) 3.125 MG tablet Take 1 tablet (3.125 mg total) by mouth 2 (two) times daily with a meal. 180 tablet 3   Cholecalciferol (VITAMIN D3) 125 MCG (5000 UT) CAPS Take 1 capsule by mouth daily.     colchicine 0.6 MG tablet Take 1 tablet (0.6 mg total) by mouth daily. 90 tablet 0   isosorbide mononitrate (IMDUR) 30 MG 24 hr tablet Take 1 tablet (30 mg total) by mouth daily. 30 tablet 3   loratadine (CLARITIN) 10 MG tablet Take 10 mg by mouth daily.     losartan (COZAAR) 25 MG tablet Take 1 tablet (25 mg total) by mouth daily. 90 tablet 3   nitroGLYCERIN (NITROSTAT) 0.4 MG SL tablet Place 1 tablet (0.4 mg total) under the tongue every 5 (five) minutes as needed for chest pain. 25 tablet 3   prasugrel (EFFIENT) 10 MG TABS tablet Take 1 tablet (10 mg total) by mouth daily. 30 tablet 11   sildenafil (VIAGRA) 50 MG tablet Take 1 tablet (50 mg total) by mouth daily as needed for erectile dysfunction. 10 tablet  0   Semaglutide-Weight Management 0.5 MG/0.5ML SOAJ Inject 0.5 mg into the skin once a week. 2 mL 3   WEGOVY 0.25 MG/0.5ML SOAJ INJECT THE CONTENTS OF ONE PEN UNDER THE SKIN ONCE WEEKLY ON THE SAME DAY EACH WEEK 2 mL 0   No facility-administered medications prior to visit.     Per HPI unless specifically indicated in ROS section below Review of Systems  Objective:  BP 134/80   Pulse 63   Temp (!) 97.4 F (36.3 C) (Temporal)   Ht 5\' 9"  (1.753 m)   Wt 247 lb 2 oz (112.1 kg)   SpO2 98%   BMI 36.49 kg/m   Wt Readings from Last 3 Encounters:  08/21/22 247 lb 2 oz (112.1 kg)  06/30/22 250 lb (113.4 kg)  06/23/22 253 lb (114.8 kg)      Physical Exam Vitals and nursing note reviewed.  Constitutional:       Appearance: Normal appearance. He is not ill-appearing.  HENT:     Mouth/Throat:     Mouth: Mucous membranes are moist.     Pharynx: Oropharynx is clear. No oropharyngeal exudate or posterior oropharyngeal erythema.  Eyes:     Extraocular Movements: Extraocular movements intact.     Pupils: Pupils are equal, round, and reactive to light.  Neck:     Thyroid: No thyroid mass or thyromegaly.  Cardiovascular:     Rate and Rhythm: Normal rate and regular rhythm.     Pulses: Normal pulses.     Heart sounds: Normal heart sounds. No murmur heard. Pulmonary:     Effort: Pulmonary effort is normal. No respiratory distress.     Breath sounds: Normal breath sounds. No wheezing, rhonchi or rales.  Musculoskeletal:     Cervical back: Normal range of motion and neck supple.     Right lower leg: No edema.     Left lower leg: No edema.  Skin:    General: Skin is warm and dry.     Findings: No rash.  Neurological:     Mental Status: He is alert.  Psychiatric:        Mood and Affect: Mood normal.        Behavior: Behavior normal.       Results for orders placed or performed in visit on 06/23/22  COMPLETE METABOLIC PANEL WITH GFR  Result Value Ref Range   Glucose, Bld 102 (H) 65 - 99 mg/dL   BUN 15 7 - 25 mg/dL   Creat 2.95 6.21 - 3.08 mg/dL   eGFR 89 > OR = 60 MV/HQI/6.96E9   BUN/Creatinine Ratio SEE NOTE: 6 - 22 (calc)   Sodium 141 135 - 146 mmol/L   Potassium 4.8 3.5 - 5.3 mmol/L   Chloride 105 98 - 110 mmol/L   CO2 24 20 - 32 mmol/L   Calcium 9.8 8.6 - 10.3 mg/dL   Total Protein 6.6 6.1 - 8.1 g/dL   Albumin 4.3 3.6 - 5.1 g/dL   Globulin 2.3 1.9 - 3.7 g/dL (calc)   AG Ratio 1.9 1.0 - 2.5 (calc)   Total Bilirubin 0.5 0.2 - 1.2 mg/dL   Alkaline phosphatase (APISO) 100 35 - 144 U/L   AST 41 (H) 10 - 35 U/L   ALT 63 (H) 9 - 46 U/L  Uric acid  Result Value Ref Range   Uric Acid, Serum 5.3 4.0 - 8.0 mg/dL  CBC with Differential/Platelet  Result Value Ref Range   WBC 5.8 3.8 -  10.8  Thousand/uL   RBC 4.69 4.20 - 5.80 Million/uL   Hemoglobin 13.0 (L) 13.2 - 17.1 g/dL   HCT 16.1 09.6 - 04.5 %   MCV 83.6 80.0 - 100.0 fL   MCH 27.7 27.0 - 33.0 pg   MCHC 33.2 32.0 - 36.0 g/dL   RDW 40.9 81.1 - 91.4 %   Platelets 298 140 - 400 Thousand/uL   MPV 9.9 7.5 - 12.5 fL   Neutro Abs 2,332 1,500 - 7,800 cells/uL   Lymphs Abs 2,401 850 - 3,900 cells/uL   Absolute Monocytes 644 200 - 950 cells/uL   Eosinophils Absolute 354 15 - 500 cells/uL   Basophils Absolute 70 0 - 200 cells/uL   Neutrophils Relative % 40.2 %   Total Lymphocyte 41.4 %   Monocytes Relative 11.1 %   Eosinophils Relative 6.1 %   Basophils Relative 1.2 %    Assessment & Plan:   Problem List Items Addressed This Visit     Essential hypertension    Chronic, stable continue current regimen.       Severe obesity (BMI 35.0-39.9) with comorbidity (HCC) - Primary    Tolerating wegovy 0.5mg  weekly, started this month - continue this dose. Reassess control at 6-8 week f/u visit.  Reviewed 24 hour dietary recall, recommendations made.       Relevant Medications   Semaglutide-Weight Management 0.5 MG/0.5ML SOAJ   CAD (coronary artery disease)    Appreciate cardiology care - continue aspirin, effient, imdur, carvedilol, statin, losartan and now [facility].       Transaminitis    Noted on latest labs after initial improvement.  ?fatty liver vs colchicine vs statin related. Consider updated labs next visit.         Meds ordered this encounter  Medications   Semaglutide-Weight Management 0.5 MG/0.5ML SOAJ    Sig: Inject 0.5 mg into the skin once a week.    Dispense:  3 mL    Refill:  1    No orders of the defined types were placed in this encounter.   Patient Instructions  Continue wegovy 0.5mg  weekly.  Return in 6-8 weeks for follow up visit.   Follow up plan: Return in about 6 weeks (around 10/02/2022) for follow up visit.  Eustaquio Boyden, MD

## 2022-08-21 NOTE — Assessment & Plan Note (Signed)
Chronic, stable continue current regimen.  

## 2022-08-21 NOTE — Assessment & Plan Note (Signed)
Appreciate cardiology care - continue aspirin, effient, imdur, carvedilol, statin, losartan and now [facility].

## 2022-08-21 NOTE — Assessment & Plan Note (Signed)
Tolerating wegovy 0.5mg  weekly, started this month - continue this dose. Reassess control at 6-8 week f/u visit.  Reviewed 24 hour dietary recall, recommendations made.

## 2022-08-21 NOTE — Assessment & Plan Note (Signed)
Noted on latest labs after initial improvement.  ?fatty liver vs colchicine vs statin related. Consider updated labs next visit.

## 2022-08-30 ENCOUNTER — Emergency Department
Admission: EM | Admit: 2022-08-30 | Discharge: 2022-08-30 | Disposition: A | Discharge status: Home / Self Care | Payer: BC Managed Care – PPO | Attending: Emergency Medicine | Admitting: Emergency Medicine

## 2022-08-30 ENCOUNTER — Emergency Department: Payer: BC Managed Care – PPO

## 2022-08-30 ENCOUNTER — Other Ambulatory Visit: Payer: Self-pay

## 2022-08-30 DIAGNOSIS — R112 Nausea with vomiting, unspecified: Secondary | ICD-10-CM

## 2022-08-30 DIAGNOSIS — K429 Umbilical hernia without obstruction or gangrene: Secondary | ICD-10-CM | POA: Diagnosis not present

## 2022-08-30 DIAGNOSIS — R197 Diarrhea, unspecified: Secondary | ICD-10-CM | POA: Insufficient documentation

## 2022-08-30 DIAGNOSIS — R160 Hepatomegaly, not elsewhere classified: Secondary | ICD-10-CM | POA: Diagnosis not present

## 2022-08-30 DIAGNOSIS — K5730 Diverticulosis of large intestine without perforation or abscess without bleeding: Secondary | ICD-10-CM | POA: Diagnosis not present

## 2022-08-30 DIAGNOSIS — N200 Calculus of kidney: Secondary | ICD-10-CM | POA: Diagnosis not present

## 2022-08-30 HISTORY — DX: Acute myocardial infarction, unspecified: I21.9

## 2022-08-30 LAB — LIPASE, BLOOD: Lipase: 38 U/L (ref 11–51)

## 2022-08-30 LAB — COMPREHENSIVE METABOLIC PANEL
ALT: 30 U/L (ref 0–44)
AST: 31 U/L (ref 15–41)
Albumin: 4.2 g/dL (ref 3.5–5.0)
Alkaline Phosphatase: 135 U/L — ABNORMAL HIGH (ref 38–126)
Anion gap: 13 (ref 5–15)
BUN: 16 mg/dL (ref 6–20)
CO2: 24 mmol/L (ref 22–32)
Calcium: 9 mg/dL (ref 8.9–10.3)
Chloride: 100 mmol/L (ref 98–111)
Creatinine, Ser: 1.34 mg/dL — ABNORMAL HIGH (ref 0.61–1.24)
GFR, Estimated: >60 mL/min (ref >60)
Glucose, Bld: 122 mg/dL — ABNORMAL HIGH (ref 70–99)
Potassium: 4.2 mmol/L (ref 3.5–5.1)
Sodium: 137 mmol/L (ref 135–145)
Total Bilirubin: 0.6 mg/dL (ref 0.3–1.2)
Total Protein: 7.3 g/dL (ref 6.5–8.1)

## 2022-08-30 LAB — CBC
HCT: 54.1 % — ABNORMAL HIGH (ref 39.0–52.0)
Hemoglobin: 18.2 g/dL — ABNORMAL HIGH (ref 13.0–17.0)
MCH: 28 pg (ref 26.0–34.0)
MCHC: 33.6 g/dL (ref 30.0–36.0)
MCV: 83.4 fL (ref 80.0–100.0)
Platelets: 334 10*3/uL (ref 150–400)
RBC: 6.49 MIL/uL — ABNORMAL HIGH (ref 4.22–5.81)
RDW: 14.4 % (ref 11.5–15.5)
WBC: 15.3 10*3/uL — ABNORMAL HIGH (ref 4.0–10.5)
nRBC: 0 % (ref 0.0–0.2)

## 2022-08-30 MED ORDER — IOHEXOL 300 MG/ML  SOLN
100.0000 mL | Freq: Once | INTRAMUSCULAR | Status: AC | PRN
  Administered 2022-08-30: 100 mL via INTRAVENOUS

## 2022-08-30 MED ORDER — ONDANSETRON 8 MG PO TBDP: 8.0000 mg | ORAL_TABLET | Freq: Three times a day (TID) | ORAL | 0 refills | Status: DC | PRN

## 2022-08-30 MED ORDER — SODIUM CHLORIDE 0.9 % IV BOLUS
2000.0000 mL | Freq: Once | INTRAVENOUS | Status: AC
  Administered 2022-08-30: 2000 mL via INTRAVENOUS

## 2022-08-30 NOTE — ED Triage Notes (Addendum)
Pt c/o diarrhea x1 week and reports throwing up 6 x's since this morning and is unable to keep meds or fluids down. Pt is AOX4, last BM was this morning. Pt denies exposure to known illness or contaminated food- reports he started taking Wegovy recently. Pt denies CP, dizziness.

## 2022-08-30 NOTE — ED Provider Notes (Signed)
[facility] Provider Note   Event Date/Time   First MD Initiated Contact with Patient 08/30/22 2100     (approximate) History  Diarrhea  HPI Eric Huff is a 52 y.o. male with a stated past medical history of ACS and recently started on Wegovy who presents for 3 weeks of diarrhea 1 week vomiting.  Patient states that the symptoms started after the first week of medication.  Patient states he has been having mild bilateral lower abdominal quadrant pain intermittently this time and usually resolving with diarrhea. ROS: Patient currently denies any vision changes, tinnitus, difficulty speaking, facial droop, sore throat, chest pain, shortness of breath, dysuria, or weakness/numbness/paresthesias in any extremity   Physical Exam  Triage Vital Signs: ED Triage Vitals  Encounter Vitals Group     BP 08/30/22 1841 (!) 139/105     Systolic BP Percentile --      Diastolic BP Percentile --      Pulse Rate 08/30/22 1841 (!) 102     Resp 08/30/22 1841 17     Temp 08/30/22 1841 98.7 F (37.1 C)     Temp Source 08/30/22 1841 Oral     SpO2 08/30/22 1841 97 %     Weight --      Height --      Head Circumference --      Peak Flow --      Pain Score 08/30/22 1840 4     Pain Loc --      Pain Education --      Exclude from Growth Chart --    Most recent vital signs: Vitals:   08/30/22 1841 08/30/22 2130  BP: (!) 139/105 (!) 153/101  Pulse: (!) 102 86  Resp: 17 16  Temp: 98.7 F (37.1 C) 98.6 F (37 C)  SpO2: 97% 95%   General: Awake, oriented x4. CV:  Good peripheral perfusion.  Resp:  Normal effort.  Abd:  No distention.  Other:  Middle-aged overweight Caucasian male laying in bed in no acute distress ED Results / Procedures / Treatments  Labs (all labs ordered are listed, but only abnormal results are displayed) Labs Reviewed  COMPREHENSIVE METABOLIC PANEL - Abnormal; Notable for the following components:      Result Value   Glucose, Bld 122 (*)     Creatinine, Ser 1.34 (*)    Alkaline Phosphatase 135 (*)    All other components within normal limits  CBC - Abnormal; Notable for the following components:   WBC 15.3 (*)    RBC 6.49 (*)    Hemoglobin 18.2 (*)    HCT 54.1 (*)    All other components within normal limits  LIPASE, BLOOD  URINALYSIS, ROUTINE W REFLEX MICROSCOPIC   RADIOLOGY ED MD interpretation: CT of the abdomen pelvis with IV contrast interpreted independently by me and shows no acute abnormality or explanation for abdominal pain -Agree with radiology assessment Official radiology report(s): CT ABDOMEN PELVIS W CONTRAST  Result Date: 08/30/2022 CLINICAL DATA:  Acute abdominal pain.  Diarrhea.  Vomiting. EXAM: CT ABDOMEN AND PELVIS WITH CONTRAST TECHNIQUE: Multidetector CT imaging of the abdomen and pelvis was performed using the standard protocol following bolus administration of intravenous contrast. RADIATION DOSE REDUCTION: This exam was performed according to the departmental dose-optimization program which includes automated exposure control, adjustment of the mA and/or kV according to patient size and/or use of iterative reconstruction technique. CONTRAST:  OMNIPAQUE IOHEXOL 300 MG/ML  SOLN COMPARISON:  None Available. FINDINGS:  Lower chest: Clear lung bases. There are coronary artery calcifications. Hepatobiliary: Borderline hepatic steatosis. Enlarged liver spanning 19.8 cm cranial caudal. No focal liver abnormality. Gallbladder physiologically distended, no calcified stone. No biliary dilatation. Pancreas: Unremarkable. No pancreatic ductal dilatation or surrounding inflammatory changes. Spleen: Normal in size without focal abnormality. Adrenals/Urinary Tract: Normal adrenal glands. No hydronephrosis or perinephric edema. Homogeneous renal enhancement with symmetric excretion on delayed phase imaging. Punctate nonobstructing stone in the lower pole of the right kidney. Urinary bladder is nondistended. Stomach/Bowel:  The stomach is nondistended. There is no small bowel obstruction or inflammation. Normal appendix. Small volume of formed stool in the colon. Minimal sigmoid diverticulosis. No diverticulitis. Vascular/Lymphatic: Aortic atherosclerosis without aneurysm. Patent portal vein. Scattered central small mesenteric nodes, all subcentimeter short axis. No enlarged lymph nodes in the abdomen or pelvis. Reproductive: Prostate is unremarkable. Other: No free air or ascites. Small fat containing umbilical hernia. Minimal fat in both inguinal canals. 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 Mild lower lumbar degenerative disc disease. Scattered bone islands in the pelvis. IMPRESSION: 1. No acute abnormality or explanation for abdominal pain. 2. Punctate nonobstructing right renal stone. 3. Minimal sigmoid diverticulosis without diverticulitis. 4. Borderline hepatic steatosis with hepatomegaly. 5. 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 Aortic Atherosclerosis (ICD10-I70.0). Electronically Signed   By: Narda Rutherford M.D.   On: 08/30/2022 22:31   PROCEDURES: Critical Care performed: No Procedures MEDICATIONS ORDERED IN ED: Medications  sodium chloride 0.9 % bolus 2,000 mL (0 mLs Intravenous Stopped 08/30/22 2325)  iohexol (OMNIPAQUE) 300 MG/ML solution 100 mL (100 mLs Intravenous Contrast Given 08/30/22 2216)   IMPRESSION / MDM / ASSESSMENT AND PLAN / ED COURSE  I reviewed the triage vital signs and the nursing notes.                             The patient is on the cardiac monitor to evaluate for evidence of arrhythmia and/or significant heart rate changes. Patient's presentation is most consistent with acute presentation with potential threat to life or bodily function. Patient presents for acute nausea/vomiting The cause of the patients symptoms is not clear, but the patient is overall well appearing and is  suspected to have a transient course of illness.  Given History and Exam there does not appear to be an emergent cause of the symptoms such as small bowel obstruction, coronary syndrome, bowel ischemia, DKA, pancreatitis, appendicitis, other acute abdomen or other emergent problem.  Reassessment: After treatment, the patient is feeling much better, tolerating PO fluids, and shows no signs of dehydration.   Disposition: Discharge home with prompt primary care physician follow up in the next 48 hours. Strict return precautions discussed.   FINAL CLINICAL IMPRESSION(S) / ED DIAGNOSES   Final diagnoses:  Nausea vomiting and diarrhea   Rx / DC Orders   ED Discharge Orders          Ordered    ondansetron (ZOFRAN-ODT) 8 MG disintegrating tablet  Every 8 hours PRN        08/30/22 2258           Note:  This document was prepared using Dragon voice recognition software and may include unintentional dictation errors.   Merwyn Katos, MD 08/30/22 [phone]

## 2022-08-31 ENCOUNTER — Encounter: Payer: Self-pay | Admitting: Family Medicine

## 2022-09-07 ENCOUNTER — Ambulatory Visit: Payer: BC Managed Care – PPO | Admitting: Family Medicine

## 2022-09-07 ENCOUNTER — Encounter: Payer: Self-pay | Admitting: Family Medicine

## 2022-09-07 VITALS — BP 134/76 | HR 60 | Temp 97.4°F | Ht 69.0 in | Wt 243.1 lb

## 2022-09-07 DIAGNOSIS — R1031 Right lower quadrant pain: Secondary | ICD-10-CM

## 2022-09-07 DIAGNOSIS — T50905A Adverse effect of unspecified drugs, medicaments and biological substances, initial encounter: Secondary | ICD-10-CM

## 2022-09-07 DIAGNOSIS — D721 Eosinophilia, unspecified: Secondary | ICD-10-CM

## 2022-09-07 DIAGNOSIS — H9202 Otalgia, left ear: Secondary | ICD-10-CM | POA: Diagnosis not present

## 2022-09-07 DIAGNOSIS — K4020 Bilateral inguinal hernia, without obstruction or gangrene, not specified as recurrent: Secondary | ICD-10-CM | POA: Diagnosis not present

## 2022-09-07 DIAGNOSIS — R7401 Elevation of levels of liver transaminase levels: Secondary | ICD-10-CM

## 2022-09-07 DIAGNOSIS — M4310 Spondylolisthesis, site unspecified: Secondary | ICD-10-CM | POA: Insufficient documentation

## 2022-09-07 DIAGNOSIS — R197 Diarrhea, unspecified: Secondary | ICD-10-CM

## 2022-09-07 DIAGNOSIS — R1110 Vomiting, unspecified: Secondary | ICD-10-CM | POA: Insufficient documentation

## 2022-09-07 DIAGNOSIS — R1032 Left lower quadrant pain: Secondary | ICD-10-CM | POA: Diagnosis not present

## 2022-09-07 DIAGNOSIS — K5790 Diverticulosis of intestine, part unspecified, without perforation or abscess without bleeding: Secondary | ICD-10-CM | POA: Insufficient documentation

## 2022-09-07 HISTORY — DX: Adverse effect of unspecified drugs, medicaments and biological substances, initial encounter: T50.905A

## 2022-09-07 LAB — CBC WITH DIFFERENTIAL/PLATELET
Basophils Absolute: 0.1 10*3/uL (ref 0.0–0.1)
Basophils Relative: 0.6 % (ref 0.0–3.0)
Eosinophils Absolute: 7.4 10*3/uL — ABNORMAL HIGH (ref 0.0–0.7)
Eosinophils Relative: 48.6 % — ABNORMAL HIGH (ref 0.0–5.0)
HCT: 43.9 % (ref 39.0–52.0)
Hemoglobin: 14.1 g/dL (ref 13.0–17.0)
Lymphocytes Relative: 17.4 % (ref 12.0–46.0)
Lymphs Abs: 2.7 10*3/uL (ref 0.7–4.0)
MCHC: 32.2 g/dL (ref 30.0–36.0)
MCV: 86.1 fl (ref 78.0–100.0)
Monocytes Absolute: 0.7 10*3/uL (ref 0.1–1.0)
Monocytes Relative: 4.3 % (ref 3.0–12.0)
Neutro Abs: 4.4 10*3/uL (ref 1.4–7.7)
Neutrophils Relative %: 29.1 % — ABNORMAL LOW (ref 43.0–77.0)
Platelets: 230 10*3/uL (ref 150.0–400.0)
RBC: 5.1 Mil/uL (ref 4.22–5.81)
RDW: 15.6 % — ABNORMAL HIGH (ref 11.5–15.5)
WBC: 15.2 10*3/uL — ABNORMAL HIGH (ref 4.0–10.5)

## 2022-09-07 LAB — COMPREHENSIVE METABOLIC PANEL
ALT: 35 U/L (ref 0–53)
AST: 29 U/L (ref 0–37)
Albumin: 3.6 g/dL (ref 3.5–5.2)
Alkaline Phosphatase: 114 U/L (ref 39–117)
BUN: 11 mg/dL (ref 6–23)
CO2: 27 mEq/L (ref 19–32)
Calcium: 8.6 mg/dL (ref 8.4–10.5)
Chloride: 104 mEq/L (ref 96–112)
Creatinine, Ser: 0.97 mg/dL (ref 0.40–1.50)
GFR: 88.77 mL/min (ref >60.00)
Glucose, Bld: 99 mg/dL (ref 70–99)
Potassium: 4 mEq/L (ref 3.5–5.1)
Sodium: 137 mEq/L (ref 135–145)
Total Bilirubin: 0.3 mg/dL (ref 0.2–1.2)
Total Protein: 5.8 g/dL — ABNORMAL LOW (ref 6.0–8.3)

## 2022-09-07 MED ORDER — AMOXICILLIN 875 MG PO TABS: 875.0000 mg | ORAL_TABLET | Freq: Two times a day (BID) | ORAL | 0 refills | Status: AC

## 2022-09-07 NOTE — Assessment & Plan Note (Signed)
Mild erythema to left ear TM, discomfort present for 1 wk.  Will treat for mild acute otitis with amoxicillin 875mg  BID 1 wk course.  Update if not improved with this. Discussed probiotic use after antibiotic course.

## 2022-09-07 NOTE — Progress Notes (Signed)
Ph: [phone] Fax: [phone]   Patient ID: Eric Huff, male    DOB: [birth year], 52 y.o.   MRN: [id number]  This visit was conducted in person.  BP 134/76   Pulse 60   Temp (!) 97.4 F (36.3 C) (Temporal)   Ht 5\' 9"  (1.753 m)   Wt 243 lb 2 oz (110.3 kg)   SpO2 97%   BMI 35.90 kg/m    CC: ER f/u visit  Subjective:   HPI: Eric Huff is a 52 y.o. male presenting on 09/07/2022 for Hospitalization Follow-up (Seen on 08/30/22 at [facility] ED, dx N/V/D. Pt accompanied by wife, [name]. )   Recent ER visit for diarrhea, vomiting and bilateral lower abdominal discomfort in setting of starting Wegovy 0.25mg  06/2022 followed by 0.5mg  weekly starting 07/2022. Treated with IVF x2L.   Treated with PRN zofran with benefit.  ER records reviewed - labs showed Cr 1.34 (previously 1.01), ALP 135, WBC 15, Hgb 18.2. Contrasted CT abd/pelvis without acute abnormality, did show small fat containing inguinal hernias and bilateral L5 pars defects with mild anterolisthesis onto S1, mild lumbar DDD.   Last Wegovy dose was last Friday Aug 2nd.   Since home feeling better, nausea/ vomiting/ diarrhea fully resolved. Back to eating smaller portions.   1 wk h/o left earache, pain extending into jaw. No fevers/chills, hearing changes, URI symptoms or congestion. No recent swimming.      Relevant past medical, surgical, family and social history reviewed and updated as indicated. Interim medical history since our last visit reviewed. Allergies and medications reviewed and updated. Outpatient Medications Prior to Visit  Medication Sig Dispense Refill   allopurinol (ZYLOPRIM) 300 MG tablet Take 1 tablet (300 mg total) by mouth daily. 90 tablet 3   aspirin EC 81 MG tablet Take 1 tablet (81 mg total) by mouth daily. Swallow whole.     atorvastatin (LIPITOR) 80 MG tablet Take 1 tablet (80 mg total) by mouth daily. 90 tablet 3   CALCIUM-MAGNESIUM-ZINC PO Take 3 tablets by mouth daily.      carvedilol (COREG) 3.125 MG tablet Take 1 tablet (3.125 mg total) by mouth 2 (two) times daily with a meal. 180 tablet 3   Cholecalciferol (VITAMIN D3) 125 MCG (5000 UT) CAPS Take 1 capsule by mouth daily.     colchicine 0.6 MG tablet Take 1 tablet (0.6 mg total) by mouth daily. 90 tablet 0   loratadine (CLARITIN) 10 MG tablet Take 10 mg by mouth daily.     losartan (COZAAR) 25 MG tablet Take 1 tablet (25 mg total) by mouth daily. 90 tablet 3   nitroGLYCERIN (NITROSTAT) 0.4 MG SL tablet Place 1 tablet (0.4 mg total) under the tongue every 5 (five) minutes as needed for chest pain. 25 tablet 3   ondansetron (ZOFRAN-ODT) 8 MG disintegrating tablet Take 1 tablet (8 mg total) by mouth every 8 (eight) hours as needed for nausea or vomiting. 20 tablet 0   prasugrel (EFFIENT) 10 MG TABS tablet Take 1 tablet (10 mg total) by mouth daily. 30 tablet 11   sildenafil (VIAGRA) 50 MG tablet Take 1 tablet (50 mg total) by mouth daily as needed for erectile dysfunction. 10 tablet 0   isosorbide mononitrate (IMDUR) 30 MG 24 hr tablet Take 1 tablet (30 mg total) by mouth daily. 30 tablet 3   Semaglutide-Weight Management 0.5 MG/0.5ML SOAJ Inject 0.5 mg into the skin once a week. 3 mL 1   No facility-administered medications prior to  visit.     Per HPI unless specifically indicated in ROS section below Review of Systems  Objective:  BP 134/76   Pulse 60   Temp (!) 97.4 F (36.3 C) (Temporal)   Ht 5\' 9"  (1.753 m)   Wt 243 lb 2 oz (110.3 kg)   SpO2 97%   BMI 35.90 kg/m   Wt Readings from Last 3 Encounters:  09/07/22 243 lb 2 oz (110.3 kg)  08/21/22 247 lb 2 oz (112.1 kg)  06/30/22 250 lb (113.4 kg)      Physical Exam Vitals and nursing note reviewed.  Constitutional:      Appearance: Normal appearance. He is not ill-appearing.  HENT:     Head: Normocephalic and atraumatic.     Right Ear: Tympanic membrane, ear canal and external ear normal. There is no impacted cerumen.     Left Ear: Ear canal  and external ear normal. There is no impacted cerumen. Tympanic membrane is injected.     Nose: Nose normal.     Mouth/Throat:     Mouth: Mucous membranes are moist.     Pharynx: Oropharynx is clear. No oropharyngeal exudate or posterior oropharyngeal erythema.  Eyes:     Extraocular Movements: Extraocular movements intact.     Conjunctiva/sclera: Conjunctivae normal.     Pupils: Pupils are equal, round, and reactive to light.  Cardiovascular:     Rate and Rhythm: Normal rate and regular rhythm.     Pulses: Normal pulses.     Heart sounds: Normal heart sounds. No murmur heard. Pulmonary:     Effort: Pulmonary effort is normal. No respiratory distress.     Breath sounds: Normal breath sounds. No wheezing, rhonchi or rales.  Abdominal:     General: Bowel sounds are normal. There is no distension.     Palpations: Abdomen is soft. There is no mass.     Tenderness: There is no abdominal tenderness. There is no guarding or rebound.     Hernia: No hernia is present.  Musculoskeletal:     Cervical back: Normal range of motion and neck supple.     Right lower leg: No edema.     Left lower leg: No edema.  Lymphadenopathy:     Cervical: No cervical adenopathy.  Skin:    General: Skin is warm and dry.     Findings: No rash.  Neurological:     Mental Status: He is alert.  Psychiatric:        Mood and Affect: Mood normal.        Behavior: Behavior normal.       Results for orders placed or performed during the hospital encounter of 08/30/22  Lipase, blood  Result Value Ref Range   Lipase 38 11 - 51 U/L  Comprehensive metabolic panel  Result Value Ref Range   Sodium 137 135 - 145 mmol/L   Potassium 4.2 3.5 - 5.1 mmol/L   Chloride 100 98 - 111 mmol/L   CO2 24 22 - 32 mmol/L   Glucose, Bld 122 (H) 70 - 99 mg/dL   BUN 16 6 - 20 mg/dL   Creatinine, Ser 0.98 (H) 0.61 - 1.24 mg/dL   Calcium 9.0 8.9 - 11.9 mg/dL   Total Protein 7.3 6.5 - 8.1 g/dL   Albumin 4.2 3.5 - 5.0 g/dL   AST 31  15 - 41 U/L   ALT 30 0 - 44 U/L   Alkaline Phosphatase 135 (H) 38 - 126 U/L   Total  Bilirubin 0.6 0.3 - 1.2 mg/dL   GFR, Estimated >40 >98 mL/min   Anion gap 13 5 - 15  CBC  Result Value Ref Range   WBC 15.3 (H) 4.0 - 10.5 K/uL   RBC 6.49 (H) 4.22 - 5.81 MIL/uL   Hemoglobin 18.2 (H) 13.0 - 17.0 g/dL   HCT 11.9 (H) 14.7 - 82.9 %   MCV 83.4 80.0 - 100.0 fL   MCH 28.0 26.0 - 34.0 pg   MCHC 33.6 30.0 - 36.0 g/dL   RDW 56.2 13.0 - 86.5 %   Platelets 334 150 - 400 K/uL   nRBC 0.0 0.0 - 0.2 %    Assessment & Plan:   Problem List Items Addressed This Visit     Transaminitis    This is largely resolved. Imaging showed enlarged liver with fatty liver changes. Will continue to monitor.       Drug reaction - Primary    Anticipate combination of food poisoning and Wegovy use leading to nausea, diarrhea, followed by vomiting. Labs showed bump in creatinine and ALP as well as hemoconcentration - repeat labs today.  He has decided to stop [facility] given this bad side effect - agree should remain off of this medication.       Relevant Orders   Comprehensive metabolic panel   CBC with Differential/Platelet   Acute ear pain, left    Mild erythema to left ear TM, discomfort present for 1 wk.  Will treat for mild acute otitis with amoxicillin 875mg  BID 1 wk course.  Update if not improved with this. Discussed probiotic use after antibiotic course.       Vomiting and diarrhea    Anticipate combination of food poisoning and Wegovy use.  He is now off Wegovy - will remain off of this.  Update labs today (CBC, CMP)      Inguinal hernia bilateral, non-recurrent    Small fat containing hernias noted incidentally. Reviewed avoiding heavy lifting/straining.      Pars defect with spondylolisthesis    Incidentally noted on imaging study - discussed this. To let me know if develops lower back pain.       Diverticulosis    Incidentally noted. Reviewed watching for signs of diverticulitis,  start treatment with clear liquid diet, and seek medical care if worsening symptoms.         Meds ordered this encounter  Medications   amoxicillin (AMOXIL) 875 MG tablet    Sig: Take 1 tablet (875 mg total) by mouth 2 (two) times daily for 7 days.    Dispense:  14 tablet    Refill:  0    Orders Placed This Encounter  Procedures   Comprehensive metabolic panel   CBC with Differential/Platelet    Patient Instructions  Labs today  Stay off [facility] Cancel appointment in September.  Return after 12/27/2022 for physical.   Follow up plan: Return if symptoms worsen or fail to improve.  Eustaquio Boyden, MD

## 2022-09-07 NOTE — Assessment & Plan Note (Signed)
Anticipate combination of food poisoning and Wegovy use.  He is now off Wegovy - will remain off of this.  Update labs today (CBC, CMP)

## 2022-09-07 NOTE — Assessment & Plan Note (Signed)
Incidentally noted. Reviewed watching for signs of diverticulitis, start treatment with clear liquid diet, and seek medical care if worsening symptoms.

## 2022-09-07 NOTE — Assessment & Plan Note (Signed)
This is largely resolved. Imaging showed enlarged liver with fatty liver changes. Will continue to monitor.

## 2022-09-07 NOTE — Assessment & Plan Note (Signed)
Incidentally noted on imaging study - discussed this. To let me know if develops lower back pain.

## 2022-09-07 NOTE — Patient Instructions (Addendum)
Labs today  Stay off [facility] appointment in September.  Return after 12/27/2022 for physical.

## 2022-09-07 NOTE — Assessment & Plan Note (Signed)
Anticipate combination of food poisoning and Wegovy use leading to nausea, diarrhea, followed by vomiting. Labs showed bump in creatinine and ALP as well as hemoconcentration - repeat labs today.  He has decided to stop [facility] given this bad side effect - agree should remain off of this medication.

## 2022-09-07 NOTE — Assessment & Plan Note (Addendum)
Small fat containing hernias noted incidentally. Reviewed avoiding heavy lifting/straining.

## 2022-09-08 ENCOUNTER — Other Ambulatory Visit: Payer: BC Managed Care – PPO

## 2022-09-08 DIAGNOSIS — D721 Eosinophilia, unspecified: Secondary | ICD-10-CM

## 2022-09-08 NOTE — Addendum Note (Signed)
Addended by: Eustaquio Boyden on: 09/08/2022 10:02 AM   Modules accepted: Orders

## 2022-09-10 ENCOUNTER — Other Ambulatory Visit: Payer: Self-pay | Admitting: Family Medicine

## 2022-09-10 DIAGNOSIS — D721 Eosinophilia, unspecified: Secondary | ICD-10-CM

## 2022-09-10 NOTE — Addendum Note (Signed)
Addended by: Eustaquio Boyden on: 09/10/2022 11:33 AM   Modules accepted: Orders

## 2022-09-14 ENCOUNTER — Other Ambulatory Visit: Payer: Self-pay | Admitting: Nurse Practitioner

## 2022-09-15 ENCOUNTER — Other Ambulatory Visit (INDEPENDENT_AMBULATORY_CARE_PROVIDER_SITE_OTHER): Payer: BC Managed Care – PPO

## 2022-09-15 ENCOUNTER — Ambulatory Visit: Payer: BC Managed Care – PPO | Attending: Nurse Practitioner | Admitting: Nurse Practitioner

## 2022-09-15 ENCOUNTER — Encounter: Payer: Self-pay | Admitting: Nurse Practitioner

## 2022-09-15 VITALS — BP 108/68 | HR 62 | Ht 69.0 in | Wt 240.5 lb

## 2022-09-15 DIAGNOSIS — I2510 Atherosclerotic heart disease of native coronary artery without angina pectoris: Secondary | ICD-10-CM

## 2022-09-15 DIAGNOSIS — I10 Essential (primary) hypertension: Secondary | ICD-10-CM | POA: Diagnosis not present

## 2022-09-15 DIAGNOSIS — E785 Hyperlipidemia, unspecified: Secondary | ICD-10-CM

## 2022-09-15 DIAGNOSIS — D721 Eosinophilia, unspecified: Secondary | ICD-10-CM

## 2022-09-15 DIAGNOSIS — R002 Palpitations: Secondary | ICD-10-CM

## 2022-09-15 DIAGNOSIS — G4733 Obstructive sleep apnea (adult) (pediatric): Secondary | ICD-10-CM

## 2022-09-15 LAB — CBC WITH DIFFERENTIAL/PLATELET
Basophils Absolute: 0.1 10*3/uL (ref 0.0–0.1)
Basophils Relative: 1.4 % (ref 0.0–3.0)
Eosinophils Absolute: 1.3 10*3/uL — ABNORMAL HIGH (ref 0.0–0.7)
Eosinophils Relative: 21.7 % — ABNORMAL HIGH (ref 0.0–5.0)
HCT: 42.5 % (ref 39.0–52.0)
Hemoglobin: 13.9 g/dL (ref 13.0–17.0)
Lymphocytes Relative: 33.8 % (ref 12.0–46.0)
Lymphs Abs: 2 10*3/uL (ref 0.7–4.0)
MCHC: 32.7 g/dL (ref 30.0–36.0)
MCV: 85.6 fl (ref 78.0–100.0)
Monocytes Absolute: 0.6 10*3/uL (ref 0.1–1.0)
Monocytes Relative: 10.6 % (ref 3.0–12.0)
Neutro Abs: 1.9 10*3/uL (ref 1.4–7.7)
Neutrophils Relative %: 32.5 % — ABNORMAL LOW (ref 43.0–77.0)
Platelets: 246 10*3/uL (ref 150.0–400.0)
RBC: 4.96 Mil/uL (ref 4.22–5.81)
RDW: 15.7 % — ABNORMAL HIGH (ref 11.5–15.5)
WBC: 5.8 10*3/uL (ref 4.0–10.5)

## 2022-09-15 LAB — CK: Total CK: 255 U/L — ABNORMAL HIGH (ref 7–232)

## 2022-09-15 LAB — SEDIMENTATION RATE: Sed Rate: 17 mm/h (ref 0–20)

## 2022-09-15 LAB — VITAMIN B12: Vitamin B-12: 344 pg/mL (ref 211–911)

## 2022-09-15 NOTE — Progress Notes (Signed)
Office Visit Note  Patient: Eric Huff             Date of Birth: 01/02/1971           MRN: [id number]             PCP: Eustaquio Boyden, MD Referring: Eustaquio Boyden, MD Visit Date: 09/29/2022   Subjective:  Follow-up   History of Present Illness: Eric Huff is a 52 y.o. male here for follow up for chronic gouty arthritis on allopurinol 300 mg daily and colchicine 0.6 mg daily.  No new flares since our last visit.  He still has decreased range of motion at the right wrist has also developed an area of hypopigmentation on the back of the wrist.  He started on Wegovy injections through PCP office but developed severe GI intolerance and discontinued this.  Blood count showed hypereosinophilia up to 7400 on August 12 downtrending at 1300 on the 20th.  He has a few areas of chronic mildly erythematous skin rash on his back and on his leg but these are unchanged for years..  Previous HPI 06/23/2022 Eric Huff is a 52 y.o. male here for follow up for chronic gouty arthritis doing well today the previous right wrist inflammation started improving 1 day after the injection at our last visit.  He is taking the allopurinol 300 mg once daily has not noticed any problems.  Also taking the colchicine as once daily prophylaxis now states he will need medication refills sooner with his adjustment.  He was treated with a course of Bactrim for acute prostatitis.  He has not had any history of previous frequent genitourinary infections.   Previous HPI 05/26/22 Eric Huff is a 52 y.o. male here for right wrist pain and swelling. Symptoms particularly since NSTEMI and associated PCI via radial artery catheterization last November. 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 He has previous history of gout for about 7 years with intermittent  flares in his feet and rarely in his elbows.  Since this started he has flare up every few weeks continuously and has missed a lot of work due to these attacks. He takes colchicine as needed for flares and usually clear up within a few days. He was treated with a course of steroids at the outset with his cardiologist but not repeated within past few months. He has also noticed some increased pain in the right elbow and forearm that he thinks is tennis elbow. Currently has inflammation that increased since yesterday and progressively worsening. He just finished a course of paxlovid for COVID infection so was avoiding colchicine due to medication interaction.   Review of Systems  Constitutional:  Negative for fatigue.  HENT:  Negative for mouth sores and mouth dryness.   Eyes:  Negative for dryness.  Respiratory:  Negative for shortness of breath.   Cardiovascular:  Negative for chest pain and palpitations.  Gastrointestinal:  Negative for blood in stool, constipation and diarrhea.  Endocrine: Negative for increased urination.  Genitourinary:  Negative for involuntary urination.  Musculoskeletal:  Positive for joint pain, joint pain and morning stiffness. Negative for gait problem, joint swelling, myalgias, muscle weakness, muscle tenderness and myalgias.  Skin:  Negative for color change, rash, hair loss and sensitivity to sunlight.  Allergic/Immunologic: Negative for susceptible to infections.  Neurological:  Negative for dizziness and headaches.  Hematological:  Negative for swollen glands.  Psychiatric/Behavioral:  Negative for depressed mood and sleep disturbance. The patient is not nervous/anxious.     PMFS History:  Patient Active Problem List   Diagnosis Date Noted   Osteoarthritis of multiple joints 09/29/2022   Eosinophilia 09/10/2022   Drug reaction 09/07/2022   Acute ear pain, left 09/07/2022   Vomiting and diarrhea 09/07/2022   Inguinal hernia bilateral, non-recurrent  09/07/2022   Pars defect with spondylolisthesis 09/07/2022   Diverticulosis 09/07/2022   Transaminitis 07/01/2022   OSA on CPAP 06/30/2022   Lump in chest 01/01/2022   Tinnitus of both ears 12/27/2021   CAD (coronary artery disease) 12/17/2021   Kidney stone 05/16/2020   Palpitations 05/16/2020   Tinea corporis 02/09/2020   Vitamin D deficiency 12/20/2019   Chronic gout 07/05/2019   Hypotestosteronism 11/02/2016   Encounter for general adult medical examination with abnormal findings 10/19/2014   Dyslipidemia 04/17/2014   Severe obesity (BMI 35.0-39.9) with comorbidity (HCC) 03/23/2014   Erectile dysfunction 03/23/2014   GERD 03/21/2008   Essential hypertension 05/06/2006   Prediabetes 05/06/2006    Past Medical History:  Diagnosis Date   Acute prostatitis 06/03/2022   UCx 05/2022  - pansensitive E coli   CAD (coronary artery disease)    a. 11/2021 NSTEMI/PCI: LAD 30/41m, RI min irregs, LCX 95p/m (3.0x15 Onyx Frontier DES), 63m/d (2.5x22 Onyx Frontier DES), RCA 100p/m. RPDA fills via collats from dLAD. RPL3 fills via collats from OM3; b. 01/2022 ETT: Ex time 6:34. 1mm horizontal ST depression in V5, V6.  Rec nuc stress test; c. 02/2022 MV: EF 53%, mid-basal inflat HK. Inf ischemia - felt to be 2/2 CTO RCA w/ L->R  collats.   COVID-19 05/20/2022   Gout    History of echocardiogram    a. 11/2021 Echo: EF 55-60%, no rwma, nl RV fxn, triv MR.   Hyperlipidemia LDL goal <70    Hypertension 12/26/2005   Morbid obesity (HCC)    Myocardial infarction (HCC)    Plantar fascia rupture 08/29/2010   Tobacco use    a. Smokes 1 cigar daily.    Family History  Problem Relation Age of Onset   Hypertension Father    CAD Maternal Grandfather 34       massive MI   CAD Paternal Grandfather        MI and CABG   Hypertension Paternal Grandmother    Diabetes Paternal Grandmother    Hypertension Cousin    Cancer Maternal Grandmother        breast   Cancer Other 5       niece with  rhabdomyosarcoma   Past Surgical History:  Procedure Laterality Date   CORONARY STENT INTERVENTION N/A 12/19/2021   Procedure: CORONARY STENT INTERVENTION;  Surgeon: Iran Ouch, MD;  Location: [facility] IN[facility]IVE [facility];  Service: Cardiovascular;  Laterality: N/A;   KNEE ARTHROSCOPY     Left and right acl    LEFT HEART CATH AND CORONARY ANGIOGRAPHY N/A 12/19/2021   Procedure: LEFT HEART CATH AND CORONARY ANGIOGRAPHY;  Surgeon: Iran Ouch, MD;  Location: [facility] CV [facility];  Service: Cardiovascular;  Laterality: N/A;   ORIF ELBOW FRACTURE     left   ORIF FEMUR FRACTURE     left   REPLACEMENT TOTAL KNEE     left   SHOULDER SURGERY  2003   Social History   Social History Narrative   ** Merged History Encounter **       "[name]" Lives with  wife, youngest daughter, 2 dogs, bearded dragon Occupation: Works as a Transport planner, second shift Activity: no regular exercise, some yardwork  Diet: good water, fruits/vegetables daily, smaller portions more frequency   Immunization History  Administered Date(s) Administered   Td 01/26/1997, 01/27/2003   Tdap 11/10/2018   Zoster Recombinant(Shingrix) 12/24/2020, 03/13/2021     Objective: Vital Signs: BP 132/74 (BP Location: Left Arm, Patient Position: Sitting, Cuff Size: Normal)   Pulse (!) 59   Resp 14   Ht 5\' 9"  (1.753 m)   Wt 244 lb (110.7 kg)   BMI 36.03 kg/m    Physical Exam Constitutional:      Appearance: He is obese.  Eyes:     Conjunctiva/sclera: Conjunctivae normal.  Cardiovascular:     Rate and Rhythm: Normal rate and regular rhythm.  Pulmonary:     Effort: Pulmonary effort is normal.     Breath sounds: Normal breath sounds.  Lymphadenopathy:     Cervical: No cervical adenopathy.  Skin:    General: Skin is warm and dry.     Findings: Rash present.     Comments: Flat mildly erythematous rash on the right side of mid back and behind the right knee Small well-demarcated hypopigmented patch on dorsum of the  right wrist  Neurological:     Mental Status: He is alert.  Psychiatric:        Mood and Affect: Mood normal.      Musculoskeletal Exam:  Shoulders full ROM no tenderness or swelling Elbows full ROM no tenderness or swelling Right wrist slightly decreased extension range of motion, no tenderness to pressure or palpable swelling Fingers full ROM no tenderness or swelling Knees full ROM no tenderness or swelling Ankles full ROM no tenderness or swelling MTPs full ROM, mild tenderness to pressure over right 1st MTP   Investigation: No additional findings.  Imaging: CT ABDOMEN PELVIS W CONTRAST  Result Date: 08/30/2022 CLINICAL DATA:  Acute abdominal pain.  Diarrhea.  Vomiting. EXAM: CT ABDOMEN AND PELVIS WITH CONTRAST TECHNIQUE: Multidetector CT imaging of the abdomen and pelvis was performed using the standard protocol following bolus administration of intravenous contrast. RADIATION DOSE REDUCTION: This exam was performed according to the departmental dose-optimization program which includes automated exposure control, adjustment of the mA and/or kV according to patient size and/or use of iterative reconstruction technique. CONTRAST:  OMNIPAQUE IOHEXOL 300 MG/ML  SOLN COMPARISON:  None Available. FINDINGS: Lower chest: Clear lung bases. There are coronary artery calcifications. Hepatobiliary: Borderline hepatic steatosis. Enlarged liver spanning 19.8 cm cranial caudal. No focal liver abnormality. Gallbladder physiologically distended, no calcified stone. No biliary dilatation. Pancreas: Unremarkable. No pancreatic ductal dilatation or surrounding inflammatory changes. Spleen: Normal in size without focal abnormality. Adrenals/Urinary Tract: Normal adrenal glands. No hydronephrosis or perinephric edema. Homogeneous renal enhancement with symmetric excretion on delayed phase imaging. Punctate nonobstructing stone in the lower pole of the right kidney. Urinary bladder is nondistended.  Stomach/Bowel: The stomach is nondistended. There is no small bowel obstruction or inflammation. Normal appendix. Small volume of formed stool in the colon. Minimal sigmoid diverticulosis. No diverticulitis. Vascular/Lymphatic: Aortic atherosclerosis without aneurysm. Patent portal vein. Scattered central small mesenteric nodes, all subcentimeter short axis. No enlarged lymph nodes in the abdomen or pelvis. Reproductive: Prostate is unremarkable. Other: No free air or ascites. Small fat containing umbilical hernia. Minimal fat in both inguinal canals. 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 Mild lower lumbar degenerative disc disease. Scattered bone  islands in the pelvis. IMPRESSION: 1. No acute abnormality or explanation for abdominal pain. 2. Punctate nonobstructing right renal stone. 3. Minimal sigmoid diverticulosis without diverticulitis. 4. Borderline hepatic steatosis with hepatomegaly. 5. 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 Aortic Atherosclerosis (ICD10-I70.0). Electronically Signed   By: Narda Rutherford M.D.   On: 08/30/2022 22:31    Recent Labs: Lab Results  Component Value Date   WBC 5.8 09/15/2022   HGB 13.9 09/15/2022   PLT 246.0 09/15/2022   NA 137 09/07/2022   K 4.0 09/07/2022   CL 104 09/07/2022   CO2 27 09/07/2022   GLUCOSE 99 09/07/2022   BUN 11 09/07/2022   CREATININE 0.97 09/07/2022   BILITOT 0.3 09/07/2022   ALKPHOS 114 09/07/2022   AST 29 09/07/2022   ALT 35 09/07/2022   PROT 5.8 (L) 09/07/2022   ALBUMIN 3.6 09/07/2022   CALCIUM 8.6 09/07/2022   GFRAA 97 02/15/2006    Speciality Comments: No specialty comments available.  Procedures:  No procedures performed Allergies: Semaglutide   Assessment / Plan:     Visit Diagnoses: Idiopathic chronic gout of multiple sites without tophus - recommend continuing prophylactic colchicine every day for up to 3 months  as he remains at risk of flareup. 06/23/2022 Uric Acid 5.3 - Plan: colchicine 0.6 MG tablet  Gout appears to be well-controlled with no major flareup now at 65-month follow-up on allopurinol.  He had recent blood count checked there was hypereosinophilia but appears to be related to that [facility] and does not have symptoms suggestive for allopurinol hypersensitivity reaction.  Recommended to monitor and let us know if there are any new or extensive skin rashes.  Plan to continue on allopurinol 300 mg daily.  Can decrease colchicine to just as needed for gout flare instead of daily prophylaxis.  Primary osteoarthritis involving multiple joints  Has some osteoarthritis of multiple areas primary as well as possibly some secondary OA in the right wrist after previous gout attack.  Also with lumbar spine degenerative joint changes based on recent CT scan though not symptomatic.  Discussed preventative strategies for osteoarthritis including regular exercise and weight management as well as limiting repeat injuries inflammation and maintaining a healthy diet, handout provided.   Orders: No orders of the defined types were placed in this encounter.  Meds ordered this encounter  Medications   colchicine 0.6 MG tablet    Sig: Take as needed 1-2 times daily during gout flare    Dispense:  90 tablet    Refill:  0     Follow-Up Instructions: Return in about 6 months (around 03/29/2023) for gout on allopurinol f/u 6mos.   Fuller Plan, MD  Note - This record has been created using [employer].  Chart creation errors have been sought, but may not always  have been located. Such creation errors do not reflect on  the standard of medical care.

## 2022-09-15 NOTE — Progress Notes (Signed)
Office Visit    Patient Name: Eric Huff Date of Encounter: 09/15/2022  Primary Care Provider:  Eustaquio Boyden, MD Primary Cardiologist:  [name], MD  Chief Complaint    52 y.o. male with history of CAD status post non-STEMI and PCI, hypertension, hyperlipidemia, tobacco abuse, gout, and obesity, who presents for follow-up related to CAD.  Past Medical History    Past Medical History:  Diagnosis Date   Acute prostatitis 06/03/2022   UCx 05/2022  - pansensitive E coli   CAD (coronary artery disease)    a. 11/2021 NSTEMI/PCI: LAD 30/61m, RI min irregs, LCX 95p/m (3.0x15 Onyx Frontier DES), 10m/d (2.5x22 Onyx Frontier DES), RCA 100p/m. RPDA fills via collats from dLAD. RPL3 fills via collats from OM3; b. 01/2022 ETT: Ex time 6:34. 1mm horizontal ST depression in V5, V6.  Rec nuc stress test; c. 02/2022 MV: EF 53%, mid-basal inflat HK. Inf ischemia - felt to be 2/2 CTO RCA w/ L->R  collats.   COVID-19 05/20/2022   Gout    History of echocardiogram    a. 11/2021 Echo: EF 55-60%, no rwma, nl RV fxn, triv MR.   Hyperlipidemia LDL goal <70    Hypertension 12/26/2005   Morbid obesity (HCC)    Myocardial infarction (HCC)    Plantar fascia rupture 08/29/2010   Tobacco use    a. Smokes 1 cigar daily.   Past Surgical History:  Procedure Laterality Date   CORONARY STENT INTERVENTION N/A 12/19/2021   Procedure: CORONARY STENT INTERVENTION;  Surgeon: Iran Ouch, MD;  Location: [facility] INV[facility] CV [facility];  Service: Cardiovascular;  Laterality: N/A;   KNEE ARTHROSCOPY     Left and right acl    LEFT HEART CATH AND CORONARY ANGIOGRAPHY N/A 12/19/2021   Procedure: LEFT HEART CATH AND CORONARY ANGIOGRAPHY;  Surgeon: Iran Ouch, MD;  Location: [facility];  Service: Cardiovascular;  Laterality: N/A;   ORIF ELBOW FRACTURE     left   ORIF FEMUR FRACTURE     left   REPLACEMENT TOTAL KNEE     left   SHOULDER SURGERY  2003    Allergies  Allergies  Allergen  Reactions   Semaglutide Diarrhea and Nausea And Vomiting    On 0.5mg  dose     History of Present Illness      52 y.o. y/o male  with above past medical history including CAD, hypertension, hyperlipidemia, tobacco abuse, GERD, and obesity.  In November 2023, he was admitted with non-STEMI.  Echo showed an EF of 55 to 60% without regional wall motion abnormalities.  Diagnostic cath showed severe two-vessel CAD with chronically occluded right coronary artery with well-developed left-to-right collaterals, as well as severe left circumflex stenosis, which was felt to be the culprit. The LAD had mild to moderate nonobstructive disease. The proximal and distal left circumflex was treated with nonoverlapping drug-eluting stents (2). Postprocedure, he had severe right wrist pain and swelling. He was treated with colchicine and prednisone, and referred to rheumatology.  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  Following an episode of c/p and dypsnea in 05/2022, Imdur was added to his regimen   Since his last visit,  he has felt well.  He said no recurrence of chest pain.  He remains active in and out of work without symptoms or limitations.  He has been taking his isosorbide mononitrate somewhat intermittently.  He denies dyspnea, palpitations, PND, orthopnea, dizziness, syncope, edema, or early satiety.  Home Medications    Current Outpatient Medications  Medication Sig Dispense Refill   allopurinol (ZYLOPRIM) 300 MG tablet Take 1 tablet (300 mg total) by mouth daily. 90 tablet 3   aspirin EC 81 MG tablet Take 1 tablet (81 mg total) by mouth daily.  Swallow whole.     atorvastatin (LIPITOR) 80 MG tablet Take 1 tablet (80 mg total) by mouth daily. 90 tablet 3   CALCIUM-MAGNESIUM-ZINC PO Take 3 tablets by mouth daily.     carvedilol (COREG) 3.125 MG tablet Take 1 tablet (3.125 mg total) by mouth 2 (two) times daily with a meal. 180 tablet 3   Cholecalciferol (VITAMIN D3) 125 MCG (5000 UT) CAPS Take 1 capsule by mouth daily.     colchicine 0.6 MG tablet Take 1 tablet (0.6 mg total) by mouth daily. 90 tablet 0   isosorbide mononitrate (IMDUR) 30 MG 24 hr tablet TAKE ONE TABLET BY MOUTH EVERY DAY 30 tablet 0   loratadine (CLARITIN) 10 MG tablet Take 10 mg by mouth daily.     losartan (COZAAR) 25 MG tablet Take 1 tablet (25 mg total) by mouth daily. 90 tablet 3   nitroGLYCERIN (NITROSTAT) 0.4 MG SL tablet Place 1 tablet (0.4 mg total) under the tongue every 5 (five) minutes as needed for chest pain. 25 tablet 3   prasugrel (EFFIENT) 10 MG TABS tablet Take 1 tablet (10 mg total) by mouth daily. 30 tablet 11   sildenafil (VIAGRA) 50 MG tablet Take 1 tablet (50 mg total) by mouth daily as needed for erectile dysfunction. 10 tablet 0   No current facility-administered medications for this visit.     Review of Systems    Doing well.  He denies chest pain, palpitations, dyspnea, pnd, orthopnea, n, v, dizziness, syncope, edema, weight gain, or early satiety.  All other systems reviewed and are otherwise negative except as noted above.    Physical Exam    VS:  BP 108/68 (BP Location: Left Arm, Patient Position: Sitting, Cuff Size: Normal)   Pulse 62   Ht 5\' 9"  (1.753 m)   Wt 240 lb 8 oz (109.1 kg)   SpO2 99%   BMI 35.52 kg/m  , BMI Body mass index is 35.52 kg/m.     GEN: Well nourished, well developed, in no acute distress. HEENT: normal. Neck: Supple, no JVD, carotid bruits, or masses. Cardiac: RRR, no murmurs, rubs, or gallops. No clubbing, cyanosis, edema.  Radials 2+/PT 2+ and equal bilaterally.  Respiratory:  Respirations regular and  unlabored, clear to auscultation bilaterally. GI: Soft, nontender, nondistended, BS + x 4. MS: no deformity or atrophy. Skin: warm and dry, no rash. Neuro:  Strength and sensation are intact. Psych: Normal affect.  Accessory Clinical Findings    Lab Results  Component Value Date   WBC 15.2 (H) 09/07/2022   HGB 14.1 09/07/2022   HCT 43.9 09/07/2022   MCV 86.1 09/07/2022   PLT 230.0 09/07/2022   Lab Results  Component Value Date   CREATININE 0.97 09/07/2022   BUN 11 09/07/2022   NA 137 09/07/2022   K 4.0 09/07/2022   CL 104 09/07/2022   CO2 27 09/07/2022   Lab Results  Component Value  Date   ALT 35 09/07/2022   AST 29 09/07/2022   ALKPHOS 114 09/07/2022   BILITOT 0.3 09/07/2022   Lab Results  Component Value Date   CHOL 127 02/27/2022   HDL 34 (L) 02/27/2022   LDLCALC 68 02/27/2022   LDLDIRECT 114.0 12/16/2021   TRIG 127 02/27/2022   CHOLHDL 3.7 02/27/2022    Lab Results  Component Value Date   HGBA1C 5.9 12/16/2021    Assessment & Plan    1.  Coronary artery disease: Status post non-STEMI in November 2023 with finding of chronically occluded right coronary artery with left-to-right collaterals, as well as severe circumflex disease, treated with 2 drug-eluting stents.  Exercise treadmill test earlier this year with horizontal ST segment depression in the setting of baseline ST segment abnormalities, followed by [facility], showing inferior ischemia with normal LV function, and mid to basal inferolateral hypokinesis.  Ischemia was felt to be secondary to chronic total occlusion of the right coronary artery with reliance on collateral blood flow and thus, he has been medically managed.  He has done well since his last visit without any chest pain or dyspnea.  He remains on aspirin, statin, beta-blocker, ARB, prasugrel, and isosorbide therapy, though he notes that he has been taking the latter on an intermittent basis.  I advised that in the absence of symptoms, he can  take a nitrate holiday and wean off altogether.  2.  Essential hypertension: Blood pressure stable carvedilol, losartan, and nitrate therapy.  3.  Hyperlipidemia: LDL 68 earlier this year with normal LFTs.  Continue statin therapy.  4.  Obstructive sleep apnea: Using CPAP.  5.  History of prolonged QTc: Previously stable at 461 ms in May.  6.  Palpitations: Quiescent.  Outside [place] mobile device.  Continue beta-blocker.  7.  Disposition: Follow-up with Dr. Kirke Corin in 3 months.  At that point, he will be nearing 12 months since his event and will need to discuss status of dual antiplatelet therapy.    Nicolasa Ducking, NP 09/15/2022, 4:11 PM

## 2022-09-15 NOTE — Patient Instructions (Signed)
Medication Instructions:  Your Physician recommend you continue on your current medication as directed.    *If you need a refill on your cardiac medications before your next appointment, please call your pharmacy*   Lab Work: None If you have labs (blood work) drawn today and your tests are completely normal, you will receive your results only by: MyChart Message (if you have MyChart) OR A paper copy in the mail If you have any lab test that is abnormal or we need to change your treatment, we will call you to review the results.  Testing/Procedures: None  Follow-Up: At [facility], you and your health needs are our priority.  As part of our continuing mission to provide you with exceptional heart care, we have created designated Provider Care Teams.  These Care Teams include your primary Cardiologist (physician) and Advanced Practice Providers (APPs -  Physician Assistants and Nurse Practitioners) who all work together to provide you with the care you need, when you need it.  We recommend signing up for the patient portal called "MyChart".  Sign up information is provided on this After Visit Summary.  MyChart is used to connect with patients for Virtual Visits (Telemedicine).  Patients are able to view lab/test results, encounter notes, upcoming appointments, etc.  Non-urgent messages can be sent to your provider as well.   To learn more about what you can do with MyChart, go to [email or web address].    Your next appointment:   3 month(s)  Provider:   You may see [name], MD

## 2022-09-18 LAB — TROPONIN I: Troponin I: 5 ng/L (ref <=47)

## 2022-09-18 LAB — TRYPTASE: Tryptase: 1.7 ug/L (ref <11.0)

## 2022-09-19 LAB — IMMUNOGLOBULINS A/E/G/M, SERUM
IgA/Immunoglobulin A, Serum: 226 mg/dL (ref 90–386)
IgE (Immunoglobulin E), Serum: 68 [IU]/mL (ref 6–495)
IgG (Immunoglobin G), Serum: 653 mg/dL (ref 603–1613)
IgM (Immunoglobulin M), Srm: 30 mg/dL (ref 20–172)

## 2022-09-25 ENCOUNTER — Other Ambulatory Visit: Payer: Self-pay | Admitting: Family Medicine

## 2022-09-25 DIAGNOSIS — D721 Eosinophilia, unspecified: Secondary | ICD-10-CM

## 2022-09-29 ENCOUNTER — Encounter: Payer: Self-pay | Admitting: Internal Medicine

## 2022-09-29 ENCOUNTER — Ambulatory Visit: Payer: BC Managed Care – PPO | Attending: Internal Medicine | Admitting: Internal Medicine

## 2022-09-29 VITALS — BP 132/74 | HR 59 | Resp 14 | Ht 69.0 in | Wt 244.0 lb

## 2022-09-29 DIAGNOSIS — M159 Polyosteoarthritis, unspecified: Secondary | ICD-10-CM | POA: Diagnosis not present

## 2022-09-29 DIAGNOSIS — Z5181 Encounter for therapeutic drug level monitoring: Secondary | ICD-10-CM

## 2022-09-29 DIAGNOSIS — T50905D Adverse effect of unspecified drugs, medicaments and biological substances, subsequent encounter: Secondary | ICD-10-CM | POA: Diagnosis not present

## 2022-09-29 DIAGNOSIS — M1A09X0 Idiopathic chronic gout, multiple sites, without tophus (tophi): Secondary | ICD-10-CM

## 2022-09-29 MED ORDER — COLCHICINE 0.6 MG PO TABS
ORAL_TABLET | ORAL | 0 refills | Status: AC
Start: 2022-09-29 — End: ?

## 2022-09-29 NOTE — Patient Instructions (Signed)
Let us know if you experience any new widespread or severe rashes.  Continue allopurinol 300 mg once daily.  Stop taking colchicine, can just take this as needed when experiencing a flare up of symptoms.

## 2022-10-06 ENCOUNTER — Ambulatory Visit: Payer: BC Managed Care – PPO | Admitting: Family Medicine

## 2022-10-27 ENCOUNTER — Other Ambulatory Visit (INDEPENDENT_AMBULATORY_CARE_PROVIDER_SITE_OTHER): Payer: BC Managed Care – PPO

## 2022-10-27 DIAGNOSIS — D721 Eosinophilia, unspecified: Secondary | ICD-10-CM

## 2022-10-28 LAB — CBC WITH DIFFERENTIAL/PLATELET
Basophils Absolute: 0.1 10*3/uL (ref 0.0–0.1)
Basophils Relative: 1.2 % (ref 0.0–3.0)
Eosinophils Absolute: 0.4 10*3/uL (ref 0.0–0.7)
Eosinophils Relative: 5.1 % — ABNORMAL HIGH (ref 0.0–5.0)
HCT: 42 % (ref 39.0–52.0)
Hemoglobin: 13.7 g/dL (ref 13.0–17.0)
Lymphocytes Relative: 30.5 % (ref 12.0–46.0)
Lymphs Abs: 2.1 10*3/uL (ref 0.7–4.0)
MCHC: 32.8 g/dL (ref 30.0–36.0)
MCV: 86.7 fL (ref 78.0–100.0)
Monocytes Absolute: 0.9 10*3/uL (ref 0.1–1.0)
Monocytes Relative: 12.2 % — ABNORMAL HIGH (ref 3.0–12.0)
Neutro Abs: 3.6 10*3/uL (ref 1.4–7.7)
Neutrophils Relative %: 51 % (ref 43.0–77.0)
Platelets: 310 10*3/uL (ref 150.0–400.0)
RBC: 4.84 Mil/uL (ref 4.22–5.81)
RDW: 14.8 % (ref 11.5–15.5)
WBC: 7 10*3/uL (ref 4.0–10.5)

## 2022-11-06 ENCOUNTER — Ambulatory Visit: Payer: BC Managed Care – PPO | Admitting: Family Medicine

## 2022-11-06 VITALS — BP 122/74 | HR 72 | Temp 98.4°F | Ht 69.0 in | Wt 257.4 lb

## 2022-11-06 DIAGNOSIS — J069 Acute upper respiratory infection, unspecified: Secondary | ICD-10-CM | POA: Insufficient documentation

## 2022-11-06 LAB — POC COVID19 BINAXNOW: SARS Coronavirus 2 Ag: NEGATIVE

## 2022-11-06 NOTE — Progress Notes (Signed)
Marikay Alar, MD Phone: [phone]  Eric Huff is a 52 y.o. male who presents today for same-day visit.  Congestion: Patient's onset of symptoms yesterday morning.  Developed some postnasal drip and then congestion.  Has had some headache.  Feels like his congestion is in his head.  No ear pain.  No cough.  No fevers.  He is blowing some nonpurulent material out of his nose.  No COVID or flu exposure.  He does note a coworker had a cold recently.  Patient wonders what he can take to help with this.  Patient wonders about switching his antihistamine that he has been on for about 5 years.  Social History   Tobacco Use  Smoking Status Former   Types: Cigarettes   Passive exposure: Past  Smokeless Tobacco Former    Current Outpatient Medications on File Prior to Visit  Medication Sig Dispense Refill   allopurinol (ZYLOPRIM) 300 MG tablet Take 1 tablet (300 mg total) by mouth daily. 90 tablet 3   aspirin EC 81 MG tablet Take 1 tablet (81 mg total) by mouth daily. Swallow whole.     atorvastatin (LIPITOR) 80 MG tablet Take 1 tablet (80 mg total) by mouth daily. 90 tablet 3   CALCIUM-MAGNESIUM-ZINC PO Take 3 tablets by mouth daily.     carvedilol (COREG) 3.125 MG tablet Take 1 tablet (3.125 mg total) by mouth 2 (two) times daily with a meal. 180 tablet 3   Cholecalciferol (VITAMIN D3) 125 MCG (5000 UT) CAPS Take 1 capsule by mouth daily.     colchicine 0.6 MG tablet Take as needed 1-2 times daily during gout flare 90 tablet 0   isosorbide mononitrate (IMDUR) 30 MG 24 hr tablet TAKE ONE TABLET BY MOUTH EVERY DAY 30 tablet 0   loratadine (CLARITIN) 10 MG tablet Take 10 mg by mouth daily.     losartan (COZAAR) 25 MG tablet Take 1 tablet (25 mg total) by mouth daily. 90 tablet 3   prasugrel (EFFIENT) 10 MG TABS tablet Take 1 tablet (10 mg total) by mouth daily. 30 tablet 11   Probiotic Product (PROBIOTIC PO) Take by mouth.     sildenafil (VIAGRA) 50 MG tablet Take 1 tablet (50 mg  total) by mouth daily as needed for erectile dysfunction. 10 tablet 0   No current facility-administered medications on file prior to visit.     ROS see history of present illness  Objective  Physical Exam Vitals:   11/06/22 1423  BP: 122/74  Pulse: 72  Temp: 98.4 F (36.9 C)  SpO2: 98%    BP Readings from Last 3 Encounters:  11/06/22 122/74  09/29/22 132/74  09/15/22 108/68   Wt Readings from Last 3 Encounters:  11/06/22 257 lb 6.4 oz (116.8 kg)  09/29/22 244 lb (110.7 kg)  09/15/22 240 lb 8 oz (109.1 kg)    Physical Exam Constitutional:      General: He is not in acute distress.    Appearance: He is not diaphoretic.  HENT:     Head: Normocephalic and atraumatic.     Right Ear: Tympanic membrane and ear canal normal.     Left Ear: Ear canal normal.     Ears:     Comments: Left TM mildly erythematous though no purulence and it has good light reflex    Mouth/Throat:     Mouth: Mucous membranes are moist.     Pharynx: Oropharynx is clear.  Cardiovascular:     Rate and Rhythm: Normal  rate and regular rhythm.     Heart sounds: Normal heart sounds.  Pulmonary:     Effort: Pulmonary effort is normal.     Breath sounds: Normal breath sounds.  Lymphadenopathy:     Cervical: No cervical adenopathy.  Skin:    General: Skin is warm and dry.  Neurological:     Mental Status: He is alert.      Assessment/Plan: Please see individual problem list.  Viral upper respiratory tract infection Assessment & Plan: Discussed patient's symptoms are likely related to viral upper respiratory infection.  COVID testing negative today.  We sent out a COVID PCR test to confirm this.  Discussed supportive cares treatment for viral upper respiratory infections.  Discussed he could try switching his antihistamine to Allegra.  Discussed this would be okay.  He can also try Flonase over-the-counter.  He can do nasal saline spray.  Discussed he should start to improve within 5 to 7 days.   If not improving after a week he will let us know.  Discussed I would contact him with his COVID test this weekend if it was positive though if it was negative he would get contacted next week with the results.  Orders: -     POC COVID-19 BinaxNow -     Novel Coronavirus, NAA ([facility])   Return if symptoms worsen or fail to improve.  Work note provided.  Marikay Alar, MD [facility]

## 2022-11-06 NOTE — Patient Instructions (Signed)
Nice to see you. You could try switching your Claritin to Allegra to see if that provides more benefit for your symptoms.  You can also try Flonase over-the-counter.  She can do nasal saline spray as well.  If you are not improving over the next week please let us know.

## 2022-11-06 NOTE — Assessment & Plan Note (Addendum)
Discussed patient's symptoms are likely related to viral upper respiratory infection.  COVID testing negative today.  We sent out a COVID PCR test to confirm this.  Discussed supportive cares treatment for viral upper respiratory infections.  Discussed he could try switching his antihistamine to Allegra.  Discussed this would be okay.  He can also try Flonase over-the-counter.  He can do nasal saline spray.  Discussed he should start to improve within 5 to 7 days.  If not improving after a week he will let us know.  Discussed I would contact him with his COVID test this weekend if it was positive though if it was negative he would get contacted next week with the results.

## 2022-11-07 LAB — NOVEL CORONAVIRUS, NAA: SARS-CoV-2, NAA: NOT DETECTED

## 2022-12-15 ENCOUNTER — Encounter: Payer: Self-pay | Admitting: Cardiovascular Disease

## 2022-12-15 ENCOUNTER — Ambulatory Visit: Payer: BC Managed Care – PPO | Attending: Cardiovascular Disease | Admitting: Cardiovascular Disease

## 2022-12-15 VITALS — BP 120/80 | HR 53 | Ht 69.0 in | Wt 256.2 lb

## 2022-12-15 DIAGNOSIS — E785 Hyperlipidemia, unspecified: Secondary | ICD-10-CM

## 2022-12-15 DIAGNOSIS — I2510 Atherosclerotic heart disease of native coronary artery without angina pectoris: Secondary | ICD-10-CM | POA: Diagnosis not present

## 2022-12-15 DIAGNOSIS — I10 Essential (primary) hypertension: Secondary | ICD-10-CM

## 2022-12-15 MED ORDER — CARVEDILOL 3.125 MG PO TABS: 3.1250 mg | ORAL_TABLET | Freq: Two times a day (BID) | ORAL | 3 refills | Status: DC

## 2022-12-15 MED ORDER — LOSARTAN POTASSIUM 25 MG PO TABS: 25.0000 mg | ORAL_TABLET | Freq: Every day | ORAL | 3 refills | Status: AC

## 2022-12-15 MED ORDER — ATORVASTATIN CALCIUM 80 MG PO TABS: 80.0000 mg | ORAL_TABLET | Freq: Every day | ORAL | 3 refills | Status: DC

## 2022-12-15 MED ORDER — ISOSORBIDE MONONITRATE ER 30 MG PO TB24: 30.0000 mg | ORAL_TABLET | Freq: Every day | ORAL | 3 refills | Status: DC

## 2022-12-15 NOTE — Progress Notes (Signed)
Cardiology Office Note   Date:  12/15/2022   ID:  Eric Huff, DOB Jun 18, 1970, MRN [id number]  PCP:  Eustaquio Boyden, MD  Cardiologist:   [name], MD   Chief Complaint  Patient presents with   Follow-up    3 month f/u c/o sob. Meds reviewed verbally with pt.      History of Present Illness: Eric Huff is a 52 y.o. male who presents for a follow-up visit regarding coronary artery disease.  He has past medical history of essential hypertension, tobacco use, gout and obesity. He presented in November 2023 with non-ST elevation myocardial infarction.  Echocardiogram showed normal LV systolic function. Cardiac catheterization  showed chronically occluded right coronary artery with well-developed left-to-right collaterals and severe stenosis affecting the left circumflex.  The LAD had mild to moderate nonobstructive disease.  I performed successful PCI and drug-eluting stent placement to the mid/distal left circumflex as well as proximal left circumflex with 2 none overlapped stents.  He quit smoking after his myocardial infarction.    He had right hand pain and swelling after cardiac catheterization without hematoma.  This was felt to be due to complex regional pain syndrome.  He was evaluated by rheumatology and received a local steroid injection with improvement.  In addition, he was treated with short-term course of colchicine. 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  He had 1 mm of ST depression with evidence of inferior wall ischemia consistent with known chronically occluded right coronary artery with collaterals.  EF was 53%. He reports no recurrent chest pain.  He does complain of intermittent shortness of breath with overexertion.  He has been using CPAP on a regular basis with good relief of symptoms.  He cannot sleep without it now.  Past Medical History:  Diagnosis Date   Acute prostatitis 06/03/2022   UCx 05/2022   - pansensitive E coli   CAD (coronary artery disease)    a. 11/2021 NSTEMI/PCI: LAD 30/40m, RI min irregs, LCX 95p/m (3.0x15 Onyx Frontier DES), 55m/d (2.5x22 Onyx Frontier DES), RCA 100p/m. RPDA fills via collats from dLAD. RPL3 fills via collats from OM3; b. 01/2022 ETT: Ex time 6:34. 1mm horizontal ST depression in V5, V6.  Rec nuc stress test; c. 02/2022 MV: EF 53%, mid-basal inflat HK. Inf ischemia - felt to be 2/2 CTO RCA w/ L->R  collats.   COVID-19 05/20/2022   Gout    History of echocardiogram    a. 11/2021 Echo: EF 55-60%, no rwma, nl RV fxn, triv MR.   Hyperlipidemia LDL goal <70    Hypertension 12/26/2005   Morbid obesity (HCC)    Myocardial infarction (HCC)    Plantar fascia rupture 08/29/2010   Tobacco use    a. Smokes 1 cigar daily.    Past Surgical History:  Procedure Laterality Date   CORONARY STENT INTERVENTION N/A 12/19/2021   Procedure: CORONARY STENT INTERVENTION;  Surgeon: Iran Ouch, MD;  Location: [facility] INVASIVE CV LAB;  Service: Cardiovascular;  Laterality: N/A;   KNEE ARTHROSCOPY     Left and right acl    LEFT HEART CATH AND CORONARY ANGIOGRAPHY N/A 12/19/2021   Procedure: LEFT HEART CATH AND CORONARY ANGIOGRAPHY;  Surgeon: Iran Ouch, MD;  Location: [facility] INVASIVE CV LAB;  Service: Cardiovascular;  Laterality: N/A;   ORIF ELBOW FRACTURE     left   ORIF FEMUR FRACTURE     left   REPLACEMENT TOTAL KNEE  left   SHOULDER SURGERY  2003     Current Outpatient Medications  Medication Sig Dispense Refill   allopurinol (ZYLOPRIM) 300 MG tablet Take 1 tablet (300 mg total) by mouth daily. 90 tablet 3   aspirin EC 81 MG tablet Take 1 tablet (81 mg total) by mouth daily. Swallow whole.     atorvastatin (LIPITOR) 80 MG tablet Take 1 tablet (80 mg total) by mouth daily. 90 tablet 3   CALCIUM PO Take by mouth daily at 8 pm.     carvedilol (COREG) 3.125 MG tablet Take 1 tablet (3.125 mg total) by mouth 2 (two) times daily with a meal. 180 tablet 3    Cholecalciferol (VITAMIN D3) 125 MCG (5000 UT) CAPS Take 1 capsule by mouth daily.     colchicine 0.6 MG tablet Take as needed 1-2 times daily during gout flare 90 tablet 0   Cyanocobalamin (B-12 PO) Take by mouth daily in the afternoon.     isosorbide mononitrate (IMDUR) 30 MG 24 hr tablet TAKE ONE TABLET BY MOUTH EVERY DAY 30 tablet 0   loratadine (CLARITIN) 10 MG tablet Take 10 mg by mouth daily.     losartan (COZAAR) 25 MG tablet Take 1 tablet (25 mg total) by mouth daily. 90 tablet 3   MAGNESIUM PO Take by mouth daily in the afternoon.     prasugrel (EFFIENT) 10 MG TABS tablet Take 1 tablet (10 mg total) by mouth daily. 30 tablet 11   Probiotic Product (PROBIOTIC PO) Take by mouth.     sildenafil (VIAGRA) 50 MG tablet Take 1 tablet (50 mg total) by mouth daily as needed for erectile dysfunction. 10 tablet 0   Vitamin D-Vitamin K (VITAMIN K2-VITAMIN D3 PO) Take by mouth daily at 8 pm.     No current facility-administered medications for this visit.    Allergies:   Semaglutide    Social History:  The patient  reports that he has quit smoking. His smoking use included cigarettes. He has been exposed to tobacco smoke. He has quit using smokeless tobacco. He reports that he does not drink alcohol and does not use drugs.   Family History:  The patient's family history includes CAD in his paternal grandfather; CAD (age of onset: 25) in his maternal grandfather; Cancer in his maternal grandmother; Cancer (age of onset: 5) in an other family member; Diabetes in his paternal grandmother; Hypertension in his cousin, father, and paternal grandmother.    ROS:  Please see the history of present illness.   Otherwise, review of systems are positive for none.   All other systems are reviewed and negative.    PHYSICAL EXAM: VS:  BP 120/80 (BP Location: Left Arm, Patient Position: Sitting, Cuff Size: Normal)   Pulse (!) 53   Ht 5\' 9"  (1.753 m)   Wt 256 lb 4 oz (116.2 kg)   SpO2 98%   BMI 37.84  kg/m  , BMI Body mass index is 37.84 kg/m. GEN: Well nourished, well developed, in no acute distress  HEENT: normal  Neck: no JVD, carotid bruits, or masses Cardiac: RRR; no murmurs, rubs, or gallops,no edema  Respiratory:  clear to auscultation bilaterally, normal work of breathing GI: soft, nontender, nondistended, + BS MS: no deformity or atrophy  Skin: warm and dry, no rash Neuro:  Strength and sensation are intact Psych: euthymic mood, full affect    EKG:  EKG is ordered today. EKG showed: Sinus bradycardia Inferior infarct , age undetermined When compared with  ECG of 19-Dec-2021 10:54, No significant change was found    Recent Labs: 12/19/2021: Magnesium 1.8 03/31/2022: TSH 1.71 09/07/2022: ALT 35; BUN 11; Creatinine, Ser 0.97; Potassium 4.0; Sodium 137 10/27/2022: Hemoglobin 13.7; Platelets 310.0    Lipid Panel    Component Value Date/Time   CHOL 127 02/27/2022 0655   TRIG 127 02/27/2022 0655   HDL 34 (L) 02/27/2022 0655   CHOLHDL 3.7 02/27/2022 0655   VLDL 25 02/27/2022 0655   LDLCALC 68 02/27/2022 0655   LDLDIRECT 114.0 12/16/2021 0946      Wt Readings from Last 3 Encounters:  12/15/22 256 lb 4 oz (116.2 kg)  11/06/22 257 lb 6.4 oz (116.8 kg)  09/29/22 244 lb (110.7 kg)          No data to display            ASSESSMENT AND PLAN:   1.  Coronary artery disease involving native coronary arteries without angina: He is doing well with no recurrent anginal symptoms.  It has been 1 year since PCI and stent placement.  I discontinued prasugrel today.  Continue aspirin indefinitely.  He uses Imdur as needed and sometimes it helps with his shortness of breath.  2.  Essential hypertension: Blood pressure is well-controlled.  3.  Hyperlipidemia: Most recent lipid profile in February showed an LDL of 68.  Continue high-dose atorvastatin.  4.  Obstructive sleep apnea: Doing very well with CPAP.    Disposition:   FU in 6 months.  Signed,  [name], MD  12/15/2022 4:17 PM    [facility]

## 2022-12-15 NOTE — Patient Instructions (Signed)
Medication Instructions:  STOP the Effient *If you need a refill on your cardiac medications before your next appointment, please call your pharmacy*   Lab Work: None ordered If you have labs (blood work) drawn today and your tests are completely normal, you will receive your results only by: MyChart Message (if you have MyChart) OR A paper copy in the mail If you have any lab test that is abnormal or we need to change your treatment, we will call you to review the results.   Testing/Procedures: None ordered   Follow-Up: At [facility], you and your health needs are our priority.  As part of our continuing mission to provide you with exceptional heart care, we have created designated Provider Care Teams.  These Care Teams include your primary Cardiologist (physician) and Advanced Practice Providers (APPs -  Physician Assistants and Nurse Practitioners) who all work together to provide you with the care you need, when you need it.  We recommend signing up for the patient portal called "MyChart".  Sign up information is provided on this After Visit Summary.  MyChart is used to connect with patients for Virtual Visits (Telemedicine).  Patients are able to view lab/test results, encounter notes, upcoming appointments, etc.  Non-urgent messages can be sent to your provider as well.   To learn more about what you can do with MyChart, go to [email or web address].    Your next appointment:   6 month(s)  Provider:   You may see [name], MD or one of the following Advanced Practice Providers on your designated Care Team:   Nicolasa Ducking, NP Eula Listen, PA-C Cadence Fransico Michael, PA-C Charlsie Quest, NP Carlos Levering, NP

## 2023-01-08 ENCOUNTER — Ambulatory Visit (INDEPENDENT_AMBULATORY_CARE_PROVIDER_SITE_OTHER): Payer: BC Managed Care – PPO | Admitting: Family Medicine

## 2023-01-08 VITALS — BP 124/70 | HR 67 | Temp 97.6°F | Ht 68.75 in | Wt 263.0 lb

## 2023-01-08 DIAGNOSIS — E559 Vitamin D deficiency, unspecified: Secondary | ICD-10-CM

## 2023-01-08 DIAGNOSIS — Z0001 Encounter for general adult medical examination with abnormal findings: Secondary | ICD-10-CM | POA: Diagnosis not present

## 2023-01-08 DIAGNOSIS — R7401 Elevation of levels of liver transaminase levels: Secondary | ICD-10-CM

## 2023-01-08 DIAGNOSIS — Z125 Encounter for screening for malignant neoplasm of prostate: Secondary | ICD-10-CM

## 2023-01-08 DIAGNOSIS — L989 Disorder of the skin and subcutaneous tissue, unspecified: Secondary | ICD-10-CM

## 2023-01-08 DIAGNOSIS — G4733 Obstructive sleep apnea (adult) (pediatric): Secondary | ICD-10-CM

## 2023-01-08 DIAGNOSIS — M1A09X0 Idiopathic chronic gout, multiple sites, without tophus (tophi): Secondary | ICD-10-CM

## 2023-01-08 DIAGNOSIS — R7303 Prediabetes: Secondary | ICD-10-CM

## 2023-01-08 DIAGNOSIS — E785 Hyperlipidemia, unspecified: Secondary | ICD-10-CM

## 2023-01-08 DIAGNOSIS — I10 Essential (primary) hypertension: Secondary | ICD-10-CM

## 2023-01-08 DIAGNOSIS — Z1211 Encounter for screening for malignant neoplasm of colon: Secondary | ICD-10-CM

## 2023-01-08 DIAGNOSIS — I25118 Atherosclerotic heart disease of native coronary artery with other forms of angina pectoris: Secondary | ICD-10-CM

## 2023-01-08 DIAGNOSIS — Z0000 Encounter for general adult medical examination without abnormal findings: Secondary | ICD-10-CM

## 2023-01-08 DIAGNOSIS — N529 Male erectile dysfunction, unspecified: Secondary | ICD-10-CM

## 2023-01-08 NOTE — Progress Notes (Unsigned)
Ph: [phone] Fax: [phone]   Patient ID: Eric Huff, male    DOB: 1970/03/11, 52 y.o.   MRN: [id number]  This visit was conducted in person.  BP 124/70   Pulse 67   Temp 97.6 F (36.4 C) (Oral)   Ht 5' 8.75" (1.746 m)   Wt 263 lb (119.3 kg)   SpO2 96%   BMI 39.12 kg/m    CC: CPE Subjective:   HPI: [name] is a 52 y.o. male presenting on 01/08/2023 for Annual Exam (Pt accompanied by wife, [name])   CAD - NSTEMI s/p left heart catheterization with 2 stents to left circumflex. Discharged on ASA 81 mg and Effient for 1 year. Now only on aspirin indefinitely.   HLD - continues atorvastatin 80mg  daily. OSA - continue nightly CPAP.  Notes some dyspnea after taking night time carvedilol dose. No leg swelling. No recent chest pain.  Using isosorbide PRN.   Recurrent gout - tolerating allopurinol 300mg  daily well without recurrent flare. Also has colchicine to use PRN gout flare. This is followed by rheumatology Dr Dimple Casey.   Intolerant to [employer] - GI upset with nausea/vomiting/diarrhea.   Wants spot on skin checked  Preventative: Colon cancer screening - continue yearly iFOB  Prostate cancer - denies prostate symptoms. H/o prostatitis. Yearly PSA.  Lung cancer screening - eligible, discussed, declines referral  Flu shot - declines  COVID vaccine - declines Tetanus shot - 2005. Tdap 10/2018  Shingrix - 11/2020, 02/2021 Seat belt use discussed  Sunscreen use discussed, no changing moles on skin.  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 Interim  medical history since our last visit reviewed. Allergies and medications reviewed and updated. Outpatient Medications Prior to Visit  Medication Sig Dispense Refill   allopurinol (ZYLOPRIM) 300 MG tablet Take 1 tablet (300 mg total) by mouth daily. 90 tablet 3   atorvastatin (LIPITOR) 80 MG tablet Take 1 tablet (80 mg total) by mouth daily. 90 tablet 3   carvedilol (COREG) 3.125 MG tablet Take 1 tablet (3.125 mg total) by mouth 2 (two) times daily with a meal. 180 tablet 3   Cholecalciferol (VITAMIN D3) 125 MCG (5000 UT) CAPS Take 1 capsule by mouth daily.     colchicine 0.6 MG tablet Take as needed 1-2 times daily during gout flare 90 tablet 0   Cyanocobalamin (B-12 PO) Take by mouth daily in the afternoon.     isosorbide mononitrate (IMDUR) 30 MG 24 hr tablet Take 1 tablet (30 mg total) by mouth daily. 90 tablet 3   loratadine (CLARITIN) 10 MG tablet Take 10 mg by mouth daily.     losartan (COZAAR) 25 MG tablet Take 1 tablet (25 mg total) by mouth daily. 90 tablet 3   MAGNESIUM PO Take by mouth daily in the afternoon.     Probiotic Product (PROBIOTIC PO) Take by mouth.     sildenafil (VIAGRA) 50 MG tablet Take 1 tablet (50 mg total) by mouth daily as needed for erectile dysfunction. 10 tablet 0   Vitamin D-Vitamin K (VITAMIN K2-VITAMIN D3  PO) Take by mouth daily at 8 pm.     CALCIUM PO Take by mouth daily at 8 pm.     No facility-administered medications prior to visit.     Per HPI unless specifically indicated in ROS section below Review of Systems  Constitutional:  Negative for activity change, appetite change, chills, fatigue, fever and unexpected weight change.  HENT:  Negative for hearing loss.   Eyes:  Negative for visual disturbance.  Respiratory:  Positive for shortness of breath (see HPI). Negative for cough, chest tightness and wheezing.   Cardiovascular:  Negative for chest pain, palpitations and leg swelling.  Gastrointestinal:  Negative for abdominal distention, abdominal  pain, blood in stool, constipation, diarrhea, nausea and vomiting.  Genitourinary:  Negative for difficulty urinating and hematuria.  Musculoskeletal:  Negative for arthralgias, myalgias and neck pain.  Skin:  Negative for rash.  Neurological:  Negative for dizziness, seizures, syncope and headaches.  Hematological:  Negative for adenopathy. Does not bruise/bleed easily.  Psychiatric/Behavioral:  Negative for dysphoric mood. The patient is not nervous/anxious.     Objective:  BP 124/70   Pulse 67   Temp 97.6 F (36.4 C) (Oral)   Ht 5' 8.75" (1.746 m)   Wt 263 lb (119.3 kg)   SpO2 96%   BMI 39.12 kg/m   Wt Readings from Last 3 Encounters:  01/08/23 263 lb (119.3 kg)  12/15/22 256 lb 4 oz (116.2 kg)  11/06/22 257 lb 6.4 oz (116.8 kg)      Physical Exam Vitals and nursing note reviewed.  Constitutional:      General: He is not in acute distress.    Appearance: Normal appearance. He is well-developed. He is not ill-appearing.  HENT:     Head: Normocephalic and atraumatic.     Right Ear: Hearing, tympanic membrane, ear canal and external ear normal.     Left Ear: Hearing, tympanic membrane, ear canal and external ear normal.     Nose: Nose normal.     Mouth/Throat:     Mouth: Mucous membranes are moist.     Pharynx: Oropharynx is clear. No oropharyngeal exudate or posterior oropharyngeal erythema.  Eyes:     General: No scleral icterus.    Extraocular Movements: Extraocular movements intact.     Conjunctiva/sclera: Conjunctivae normal.     Pupils: Pupils are equal, round, and reactive to light.  Neck:     Thyroid: No thyroid mass or thyromegaly.  Cardiovascular:     Rate and Rhythm: Normal rate and regular rhythm.     Pulses: Normal pulses.          Radial pulses are 2+ on the right side and 2+ on the left side.     Heart sounds: Normal heart sounds. No murmur heard. Pulmonary:     Effort: Pulmonary effort is normal. No respiratory distress.     Breath sounds: Normal  breath sounds. No wheezing, rhonchi or rales.  Abdominal:     General: Bowel sounds are normal. There is no distension.     Palpations: Abdomen is soft. There is no mass.     Tenderness: There is no abdominal tenderness. There is no guarding or rebound.     Hernia: No hernia is present.  Musculoskeletal:        General: Normal range of motion.     Cervical back: Normal range of motion and neck supple.     Right lower leg: No edema.     Left lower leg: No  edema.  Lymphadenopathy:     Cervical: No cervical adenopathy.  Skin:    General: Skin is warm and dry.     Findings: No rash.  Neurological:     General: No focal deficit present.     Mental Status: He is alert and oriented to person, place, and time.  Psychiatric:        Mood and Affect: Mood normal.        Behavior: Behavior normal.        Thought Content: Thought content normal.        Judgment: Judgment normal.       Results for orders placed or performed in visit on 11/06/22  POC COVID-19   Collection Time: 11/06/22  2:29 PM  Result Value Ref Range   SARS Coronavirus 2 Ag Negative Negative  Novel Coronavirus, NAA ([facility])   Collection Time: 11/06/22  3:15 PM   Specimen: Nasopharyngeal(NP) swabs in vial transport medium   Nasopharynge  Previous  Result Value Ref Range   SARS-CoV-2, NAA Not Detected Not Detected    Assessment & Plan:   Problem List Items Addressed This Visit     Essential hypertension   Chronic, stable. Continue current regimen.       Erectile dysfunction   Does not need viagra refill at this time. Previously filled 50mg .  Aware he must not mix with nitroglycerin or Imdur - which he is now taking only PRN.       Health maintenance examination - Primary   Preventative protocols reviewed and updated unless pt declined. Discussed healthy diet and lifestyle.       Other Visit Diagnoses       Special screening for malignant neoplasms, colon       Relevant Orders   Fecal occult blood,  imunochemical        No orders of the defined types were placed in this encounter.   Orders Placed This Encounter  Procedures   Fecal occult blood, imunochemical    Standing Status:   Future    Expiration Date:   01/08/2024    Patient Instructions  Return at your convenience for fasting labs.  Pass by lab to pick up stool kit.  You are doing well today  Return as needed or in 1 year for next physical   Follow up plan: Return in about 1 year (around 01/08/2024) for annual exam, prior fasting for blood work.  Eustaquio Boyden, MD

## 2023-01-08 NOTE — Assessment & Plan Note (Addendum)
Does not need viagra refill at this time. Previously filled 50mg .  Aware he must not mix with nitroglycerin or Imdur - which he is now taking only PRN.

## 2023-01-08 NOTE — Patient Instructions (Addendum)
Return at your convenience for fasting labs.  Pass by lab to pick up stool kit.  You are doing well today  Return as needed or in 1 year for next physical

## 2023-01-08 NOTE — Assessment & Plan Note (Signed)
Preventative protocols reviewed and updated unless pt declined. Discussed healthy diet and lifestyle.  

## 2023-01-08 NOTE — Assessment & Plan Note (Signed)
Chronic, stable. Continue current regimen. 

## 2023-01-11 ENCOUNTER — Encounter: Payer: Self-pay | Admitting: Family Medicine

## 2023-01-11 DIAGNOSIS — L989 Disorder of the skin and subcutaneous tissue, unspecified: Secondary | ICD-10-CM | POA: Insufficient documentation

## 2023-01-11 NOTE — Assessment & Plan Note (Signed)
Suspect irritated SK vs wart. Suggested trial OTC cortizone-10. Update if ongoing or changing.

## 2023-01-11 NOTE — Assessment & Plan Note (Signed)
Chronic, continues atorvastatin 80mg  daily. Update FLP when he returns fasting.  The ASCVD Risk score (Arnett DK, et al., 2019) failed to calculate for the following reasons:   Risk score cannot be calculated because patient has a medical history suggesting prior/existing ASCVD

## 2023-01-11 NOTE — Assessment & Plan Note (Signed)
Chronic, stable on allopurinol 300mg  daily, followed by rheumatology.

## 2023-01-11 NOTE — Assessment & Plan Note (Addendum)
Appreciate cardiology care - continues aspirin, now off brillinta. Continue statin losartan and carvedilol. Using isosorbide mononitrate (Imdur) only PRN.

## 2023-01-11 NOTE — Assessment & Plan Note (Addendum)
Update A1c when he returns for labs

## 2023-01-11 NOTE — Assessment & Plan Note (Addendum)
Imaging previously showed fatty liver changes.  Update LFTs.

## 2023-01-11 NOTE — Assessment & Plan Note (Signed)
Update levels on vit D 5000 units daily.

## 2023-01-11 NOTE — Assessment & Plan Note (Signed)
Encourage healthy diet and lifestyle choices to affect sustainable weight loss. Significant intolerance to [facility] when tried earlier this year.

## 2023-01-11 NOTE — Assessment & Plan Note (Signed)
Continues nightly CPAP use.

## 2023-01-12 ENCOUNTER — Other Ambulatory Visit (INDEPENDENT_AMBULATORY_CARE_PROVIDER_SITE_OTHER): Payer: BC Managed Care – PPO

## 2023-01-12 DIAGNOSIS — M1A09X0 Idiopathic chronic gout, multiple sites, without tophus (tophi): Secondary | ICD-10-CM | POA: Diagnosis not present

## 2023-01-12 DIAGNOSIS — E785 Hyperlipidemia, unspecified: Secondary | ICD-10-CM

## 2023-01-12 DIAGNOSIS — E559 Vitamin D deficiency, unspecified: Secondary | ICD-10-CM

## 2023-01-12 DIAGNOSIS — R7303 Prediabetes: Secondary | ICD-10-CM | POA: Diagnosis not present

## 2023-01-12 DIAGNOSIS — Z125 Encounter for screening for malignant neoplasm of prostate: Secondary | ICD-10-CM

## 2023-01-12 LAB — COMPREHENSIVE METABOLIC PANEL
ALT: 40 U/L (ref 0–53)
AST: 31 U/L (ref 0–37)
Albumin: 4.4 g/dL (ref 3.5–5.2)
Alkaline Phosphatase: 103 U/L (ref 39–117)
BUN: 17 mg/dL (ref 6–23)
CO2: 30 meq/L (ref 19–32)
Calcium: 9.3 mg/dL (ref 8.4–10.5)
Chloride: 103 meq/L (ref 96–112)
Creatinine, Ser: 1.01 mg/dL (ref 0.40–1.50)
GFR: 84.36 mL/min (ref >60.00)
Glucose, Bld: 96 mg/dL (ref 70–99)
Potassium: 4.2 meq/L (ref 3.5–5.1)
Sodium: 139 meq/L (ref 135–145)
Total Bilirubin: 0.7 mg/dL (ref 0.2–1.2)
Total Protein: 6.7 g/dL (ref 6.0–8.3)

## 2023-01-12 LAB — PSA: PSA: 0.8 ng/mL (ref 0.10–4.00)

## 2023-01-12 LAB — LIPID PANEL
Cholesterol: 124 mg/dL (ref 0–200)
HDL: 38.4 mg/dL — ABNORMAL LOW (ref >39.00)
LDL Cholesterol: 60 mg/dL (ref 0–99)
NonHDL: 85.73
Total CHOL/HDL Ratio: 3
Triglycerides: 127 mg/dL (ref 0.0–149.0)
VLDL: 25.4 mg/dL (ref 0.0–40.0)

## 2023-01-12 LAB — VITAMIN D 25 HYDROXY (VIT D DEFICIENCY, FRACTURES): VITD: 38.94 ng/mL (ref 30.00–100.00)

## 2023-01-12 LAB — URIC ACID: Uric Acid, Serum: 6.5 mg/dL (ref 4.0–7.8)

## 2023-01-12 LAB — HEMOGLOBIN A1C: Hgb A1c MFr Bld: 6.1 % (ref 4.6–6.5)

## 2023-02-08 ENCOUNTER — Other Ambulatory Visit: Payer: Self-pay | Admitting: Family Medicine

## 2023-02-23 ENCOUNTER — Encounter: Payer: BC Managed Care – PPO | Admitting: Family Medicine

## 2023-03-16 NOTE — Progress Notes (Signed)
 Office Visit Note  Patient: Eric Huff             Date of Birth: [birth year]           MRN: [id number]             PCP: Eustaquio Boyden, MD Referring: Eustaquio Boyden, MD Visit Date: 03/30/2023   Subjective:  Follow-up   Discussed the use of AI scribe software for clinical note transcription with the patient, who gave verbal consent to proceed.  History of Present Illness   Eric Huff is a 52 y.o. male here for follow up for chronic gouty arthritis on allopurinol 300 mg daily and colchicine 0.6 mg as needed.  He has experienced three small gout flare-ups since his last visit in September, primarily affecting his toes. These episodes were painful but not associated with significant swelling. He managed these episodes with colchicine, taking one dose for the first flare-up and two doses over two days for the subsequent ones. He continues to take allopurinol for maintenance.  He identifies processed foods, particularly from fast-food chains like [employer] and [employer], as potential triggers for his gout flare-ups. He also notes that dehydration may contribute to his symptoms, as he often sweats at work and does not hydrate sufficiently.  He mentions experiencing occasional stiffness and a 'popping' sensation in his fingers, particularly in the morning or after prolonged use, such as working on the computer. This has been occurring for about a month and affects two fingers, but the sensation resolves quickly without lasting effects.  No significant fluid retention, but occasional minor swelling. No new or worsening symptoms in his wrist since receiving a prior injection, and he maintains good movement despite some osteoarthritis.      Previous HPI 09/29/2022 Eric Huff is a 53 y.o. male here for follow up for chronic gouty arthritis on allopurinol 300 mg daily and colchicine 0.6 mg daily.  No new flares since our last visit.  He still has decreased range of motion  at the right wrist has also developed an area of hypopigmentation on the back of the wrist.  He started on Wegovy injections through PCP office but developed severe GI intolerance and discontinued this.  Blood count showed hypereosinophilia up to 7400 on August 12 downtrending at 1300 on the 20th.  He has a few areas of chronic mildly erythematous skin rash on his back and on his leg but these are unchanged for years..   Previous HPI 06/23/2022 Eric Huff is a 53 y.o. male here for follow up for chronic gouty arthritis doing well today the previous right wrist inflammation started improving 1 day after the injection at our last visit.  He is taking the allopurinol 300 mg once daily has not noticed any problems.  Also taking the colchicine as once daily prophylaxis now states he will need medication refills sooner with his adjustment.  He was treated with a course of Bactrim for acute prostatitis.  He has not had any history of previous frequent genitourinary infections.   Previous HPI 05/26/22 Eric Huff is a 53 y.o. male here for right wrist pain and swelling. Symptoms particularly since NSTEMI and associated PCI via radial artery catheterization last November. 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 He has previous history of gout for about 7 years with intermittent flares in his feet and  rarely in his elbows.  Since this started he has flare up every few weeks continuously and has missed a lot of work due to these attacks. He takes colchicine as needed for flares and usually clear up within a few days. He was treated with a course of steroids at the outset with his cardiologist but not repeated within past few months. He has also noticed some increased pain in the right elbow and forearm that he thinks is tennis elbow. Currently has inflammation  that increased since yesterday and progressively worsening. He just finished a course of paxlovid for COVID infection so was avoiding colchicine due to medication interaction.   Review of Systems  Constitutional:  Negative for fatigue.  HENT:  Negative for mouth sores and mouth dryness.   Eyes:  Negative for dryness.  Respiratory:  Negative for shortness of breath.   Cardiovascular:  Negative for chest pain and palpitations.  Gastrointestinal:  Negative for blood in stool, constipation and diarrhea.  Endocrine: Negative for increased urination.  Genitourinary:  Negative for involuntary urination.  Musculoskeletal:  Positive for joint pain, joint pain and morning stiffness. Negative for gait problem, joint swelling, myalgias, muscle weakness, muscle tenderness and myalgias.  Skin:  Negative for color change, rash, hair loss and sensitivity to sunlight.  Allergic/Immunologic: Negative for susceptible to infections.  Neurological:  Negative for dizziness and headaches.  Hematological:  Negative for swollen glands.  Psychiatric/Behavioral:  Negative for depressed mood and sleep disturbance. The patient is not nervous/anxious.     PMFS History:  Patient Active Problem List   Diagnosis Date Noted   Trigger finger, right ring finger 03/30/2023   Skin lesion of right lower extremity 01/11/2023   Osteoarthritis of multiple joints 09/29/2022   Eosinophilia 09/10/2022   Inguinal hernia bilateral, non-recurrent 09/07/2022   Pars defect with spondylolisthesis 09/07/2022   Diverticulosis 09/07/2022   Transaminitis 07/01/2022   OSA on CPAP 06/30/2022   Lump in chest 01/01/2022   Tinnitus of both ears 12/27/2021   CAD (coronary artery disease) 12/17/2021   Kidney stone 05/16/2020   Tinea corporis 02/09/2020   Vitamin D deficiency 12/20/2019   Chronic gout 07/05/2019   Hypotestosteronism 11/02/2016   Health maintenance examination 10/19/2014   Dyslipidemia 04/17/2014   Severe obesity (BMI  35.0-39.9) with comorbidity (HCC) 03/23/2014   Erectile dysfunction 03/23/2014   GERD 03/21/2008   Essential hypertension 05/06/2006   Prediabetes 05/06/2006    Past Medical History:  Diagnosis Date   Acute prostatitis 06/03/2022   UCx 05/2022  - pansensitive E coli   CAD (coronary artery disease)    a. 11/2021 NSTEMI/PCI: LAD 30/71m, RI min irregs, LCX 95p/m (3.0x15 Onyx Frontier DES), 41m/d (2.5x22 Onyx Frontier DES), RCA 100p/m. RPDA fills via collats from dLAD. RPL3 fills via collats from OM3; b. 01/2022 ETT: Ex time 6:34. 1mm horizontal ST depression in V5, V6.  Rec nuc stress test; c. 02/2022 MV: EF 53%, mid-basal inflat HK. Inf ischemia - felt to be 2/2 CTO RCA w/ L->R  collats.   COVID-19 05/20/2022   Drug reaction 09/07/2022   Food poisoning + [id number] - now off this     Gout    History of echocardiogram    a. 11/2021 Echo: EF 55-60%, no rwma, nl RV fxn, triv MR.   Hyperlipidemia LDL goal <70    Hypertension 12/26/2005   Morbid obesity (HCC)    Myocardial infarction (HCC)    Plantar fascia rupture 08/29/2010   Tobacco use    a. Smokes  1 cigar daily.    Family History  Problem Relation Age of Onset   Hypertension Father    CAD Maternal Grandfather 53       massive MI   CAD Paternal Grandfather        MI and CABG   Hypertension Paternal Grandmother    Diabetes Paternal Grandmother    Hypertension Cousin    Cancer Maternal Grandmother        breast   Cancer Other 5       niece with rhabdomyosarcoma   Past Surgical History:  Procedure Laterality Date   CORONARY STENT INTERVENTION N/A 12/19/2021   Procedure: CORONARY STENT INTERVENTION;  Surgeon: Iran Ouch, MD;  Location: [facility] INVASIVE CV [facility];  Service: Cardiovascular;  Laterality: N/A;   KNEE ARTHROSCOPY     Left and right acl    LEFT HEART CATH AND CORONARY ANGIOGRAPHY N/A 12/19/2021   Procedure: LEFT HEART CATH AND CORONARY ANGIOGRAPHY;  Surgeon: Iran Ouch, MD;  Location: [facility] INVASIVE CV LAB;   Service: Cardiovascular;  Laterality: N/A;   ORIF ELBOW FRACTURE     left   ORIF FEMUR FRACTURE     left   REPLACEMENT TOTAL KNEE     left   SHOULDER SURGERY  2003   Social History   Social History Narrative   ** Merged History Encounter **       "[name]" Lives with wife, youngest daughter, 2 dogs, bearded dragon Occupation: Works as a Transport planner, second shift Activity: no regular exercise, some yardwork  Diet: good water, fruits/vegetables daily, smaller portions more frequency   Immunization History  Administered Date(s) Administered   Td 01/26/1997, 01/27/2003   Tdap 11/10/2018   Zoster Recombinant(Shingrix) 12/24/2020, 03/13/2021     Objective: Vital Signs: BP 125/76 (BP Location: Right Arm, Patient Position: Sitting, Cuff Size: Large)   Pulse 67   Resp 14   Ht 5\' 9"  (1.753 m)   Wt 261 lb (118.4 kg)   BMI 38.54 kg/m    Physical Exam Eyes:     Conjunctiva/sclera: Conjunctivae normal.  Cardiovascular:     Rate and Rhythm: Normal rate and regular rhythm.  Pulmonary:     Effort: Pulmonary effort is normal.     Breath sounds: Normal breath sounds.  Musculoskeletal:     Right lower leg: No edema.     Left lower leg: No edema.  Lymphadenopathy:     Cervical: No cervical adenopathy.  Skin:    General: Skin is warm and dry.     Findings: No rash.  Neurological:     Mental Status: He is alert.  Psychiatric:        Mood and Affect: Mood normal.      Musculoskeletal Exam:  Mild restriction shoulder and elbow ROM with hard endpoint, without tenderness to pressure or swelling Right wrist mild restriction in extension ROM, no focal tenderness Fingers full ROM, some chronic bony widening of MCPs, no focal tenderness or swelling, no reproducible triggering or palpable nodules Knees bilateral replacement scars, full ROM no tenderness or swelling Ankles full ROM no tenderness or swelling Mild bony MTP nodule, flat feet, no palpable swelling or  tenderness   Investigation: No additional findings.  Imaging: No results found.  Recent Labs: Lab Results  Component Value Date   WBC 7.0 10/27/2022   HGB 13.7 10/27/2022   PLT 310.0 10/27/2022   NA 139 01/12/2023   K 4.2 01/12/2023   CL 103 01/12/2023   CO2 30  01/12/2023   GLUCOSE 96 01/12/2023   BUN 17 01/12/2023   CREATININE 1.01 01/12/2023   BILITOT 0.7 01/12/2023   ALKPHOS 103 01/12/2023   AST 31 01/12/2023   ALT 40 01/12/2023   PROT 6.7 01/12/2023   ALBUMIN 4.4 01/12/2023   CALCIUM 9.3 01/12/2023   GFRAA 97 02/15/2006    Speciality Comments: No specialty comments available.  Procedures:  No procedures performed Allergies: Semaglutide   Assessment / Plan:     Visit Diagnoses: Idiopathic chronic gout of multiple sites without tophus - 01/12/2023 Uric Acid 6.5 - Plan: allopurinol (ZYLOPRIM) 300 MG tablet Three mild flare-ups since September, managed with colchicine. Uric acid level at 6.5, above target. Processed foods and dehydration identified as triggers. - Continue allopurinol 300 mg daily. - Use colchicine as needed for flare-ups. - Encourage increased hydration.  Primary osteoarthritis involving multiple joints multiple areas affected significant reduction in range of motion from shoulders down to wrists also had previous bilateral knee replacements.  Cannot tolerate NSAIDs due to other medications.  He tried adding turmeric which his wife was using for arthritis and feels this has been partially helpful. -Right printed handout on additional oral arthritis supplements  Trigger finger, right ring finger Symptoms of trigger finger in third and fourth fingers, likely related to osteoarthritis, managed with stretching. - Provide information on hand stretches and trigger finger management. - Consider finger splint if symptoms worsen.    Orders: No orders of the defined types were placed in this encounter.  Meds ordered this encounter  Medications    allopurinol (ZYLOPRIM) 300 MG tablet    Sig: Take 1 tablet (300 mg total) by mouth daily.    Dispense:  90 tablet    Refill:  3     Follow-Up Instructions: Return in about 6 months (around 09/30/2023) for Gout/OA f/u 6mos.   Fuller Plan, MD  Note - This record has been created using [employer].  Chart creation errors have been sought, but may not always  have been located. Such creation errors do not reflect on  the standard of medical care.

## 2023-03-30 ENCOUNTER — Encounter: Payer: Self-pay | Admitting: Internal Medicine

## 2023-03-30 ENCOUNTER — Ambulatory Visit: Payer: BC Managed Care – PPO | Attending: Internal Medicine | Admitting: Internal Medicine

## 2023-03-30 VITALS — BP 125/76 | HR 67 | Resp 14 | Ht 69.0 in | Wt 261.0 lb

## 2023-03-30 DIAGNOSIS — M65341 Trigger finger, right ring finger: Secondary | ICD-10-CM | POA: Insufficient documentation

## 2023-03-30 DIAGNOSIS — M1A09X0 Idiopathic chronic gout, multiple sites, without tophus (tophi): Secondary | ICD-10-CM

## 2023-03-30 DIAGNOSIS — M150 Primary generalized (osteo)arthritis: Secondary | ICD-10-CM | POA: Diagnosis not present

## 2023-03-30 DIAGNOSIS — Z5181 Encounter for therapeutic drug level monitoring: Secondary | ICD-10-CM

## 2023-03-30 MED ORDER — ALLOPURINOL 300 MG PO TABS: 300.0000 mg | ORAL_TABLET | Freq: Every day | ORAL | 3 refills | Status: DC

## 2023-03-30 NOTE — Patient Instructions (Signed)
 Information for patients with Gout  Gout defined-Gout occurs when urate crystals accumulate in your joint causing the inflammation and intense pain of gout attack.  Urate crystals can form when you have high levels of uric acid in your blood.  Your body produces uric acid when it breaks down prurines-substances that are found naturally in your body, as well as in certain foods such as organ meats, anchioves, herring, asparagus, and mushrooms.  Normally uric acid dissolves in your blood and passes through your kidneys into your urine.  But sometimes your body either produces too much uric acid or your kidneys excrete too little uric acid.  When this happens, uric acid can build up, forming sharp needle-like urate crystals in a joint or surrounding tissue that cause pain, inflammation and swelling.    Gout is characterized by sudden, severe attacks of pain, redness and tenderness in joints, often the joint at the base of the big toe.  Gout is complex form of arthritis that can affect anyone.  Men are more likely to get gout but women become increasingly more susceptible to gout after menopause.  An acute attack of gout can wake you up in the middle of the night with the sensation that your big toe is on fire.  The affected joint is hot, swollen and so tender that even the weight or the sheet on it may seem intolerable.  If you experience symptoms of an acute gout attack it is important to your doctor as soon as the symptoms start.  Gout that goes untreated can lead to worsening pain and joint damage.  Risk Factors:  You are more likely to develop gout if you have high levels of uric acid in your body.    Factors that increase the uric acid level in your body include:  Lifestyle factors.  Excessive alcohol use-generally more than two drinks a day for men and more than one for women increase the risk of gout.  Medical conditions.  Certain conditions make it more likely that you will develop gout.   These include hypertension, and chronic conditions such as diabetes, high levels of fat and cholesterol in the blood, and narrowing of the arteries.  Certain medications.  The uses of Thiazide diuretics- commonly used to treat hypertension and low dose aspirin can also increase uric acid levels.  Family history of gout.  If other members of your family have had gout, you are more likely to develop the disease.  Age and sex. Gout occurs more often in men than it does in women, primarily because women tend to have lower uric acid levels than men do.  Men are more likely to develop gout earlier usually between the ages of 2-50- whereas women generally develop signs and symptoms after menopause.   Treatment:  Treatment for gout usually involves medications.  What medications you and your doctor choose will be based on your current health and other medications you currently take.  Gout medications can be used to treat acute gout attacks and prevent future attacks as well as reduce your risk of complications from gout such as the development of tophi from urate crystal deposits.  Alternative medicine:   Certain foods have been studied for their potential to lower uric acid levels, including:  Coffee.  Studies have found an association between coffee drinking (regular and decaf) and lower uric acid levels.  The evidence is not enough to encourage non-coffee drinkers to start, but it may give clues to new ways of  treating gout in the future.  Vitamin C.  Supplements containing vitamin C may reduce the levels of uric acid in your blood.  However, vitamin as a treatment for gout. Don't assume that if a little vitamin C is good, than lots is better.  Megadoses of vitamin C may increase your bodies uric acid levels.  Cherries.  Cherries have been associated with lower levels of uric acid in studies, but it isn't clear if they have any effect on gout signs and symptoms.  Eating more cherries and other  dar-colored fruits, such as blackberries, blueberries, purple grapes and raspberries, may be a safe way to support your gout treatment.    Lifestyle/Diet Recommendations:  Drink 8 to 16 cups ( about 2 to 4 liters) of fluid each day, with at least half being water. Avoid alcohol Eat a moderate amount of protein, preferably from healthy sources, such as low-fat or fat-free dairy, tofu, eggs, and nut butters. Limit you daily intake of meat, fish, and poultry to 4 to 6 ounces. Avoid high fat meats and desserts. Decrease you intake of shellfish, beef, lamb, pork, eggs and cheese. Choose a good source of vitamin C daily such as citrus fruits, strawberries, broccoli,  brussel sprouts, papaya, and cantaloupe.  Choose a good source of vitamin A every other day such as yellow fruits, or dark green/yellow vegetables. Avoid drastic weigh reduction or fasting.  If weigh loss is desired lose it over a period of several months. See "dietary considerations.." chart for specific food recommendations.  Dietary Considerations for people with Gout  Food with negligible purine content (0-15 mg of purine nitrogen per 100 grams food)  May use as desired except on calorie variations  Non fat milk Cocoa Cereals (except in list II) Hard candies  Buttermilk Carbonated drinks Vegetables (except in list II) Sherbet  Coffee Fruits Sugar Honey  Tea Cottage Cheese Gelatin-jell-o Salt  Fruit juice Breads Angel food Cake   Herbs/spices Jams/Jellies [employer]    Foods that do not contain excessive purine content, but must be limited due to fat content  Cream Eggs Oil and Salad Dressing  Half and Half Peanut Butter Chocolate  Whole Milk Cakes Potato Chips  Butter Ice Cream Fried Foods  Cheese Nuts Waffles, pancakes   List II: Food with moderate purine content (50-150 mg of purine nitrogen per 100 grams of food)  Limit total amount each day to 5 oz. cooked Lean meat, other than those on list  III   Poultry, other than those on list III Fish, other than those on list III   Seafood, other than those on list III  These foods may be used occasionally  Peas Lentils Bran  Spinach Oatmeal Dried Beans and Peas  Asparagus Wheat Germ Mushrooms   Additional information about meat choices  Choose fish and poultry, particularly without skin, often.  Select lean, well trimmed cuts of meat.  Avoid all fatty meats, bacon , sausage, fried meats, fried fish, or poultry, luncheon meats, cold cuts, hot dogs, meats canned or frozen in gravy, spareribs and frozen and packaged prepared meats.   List III: Foods with HIGH purine content / Foods to AVOID (150-800 mg of purine nitrogen per 100 grams of food)  Anchovies Herring Meat Broths  Liver Mackerel Meat Extracts  Kidney Scallops Meat Drippings  Sardines Wild Game Mincemeat  Sweetbreads Goose Gravy  Heart Tongue Yeast, baker's and brewers   Commercial soups made with any of the foods listed in List II  or List III  In addition avoid all alcoholic beverages

## 2023-04-08 DIAGNOSIS — S61216A Laceration without foreign body of right little finger without damage to nail, initial encounter: Secondary | ICD-10-CM | POA: Diagnosis not present

## 2023-04-15 ENCOUNTER — Telehealth: Payer: Self-pay | Admitting: Cardiovascular Disease

## 2023-04-15 NOTE — Telephone Encounter (Signed)
 Received [employer] form to be completed. Placed in nurse box.

## 2023-04-20 NOTE — Telephone Encounter (Signed)
 I signed it. Please fill up the contact info.

## 2023-04-21 NOTE — Telephone Encounter (Signed)
 Forms have been faxed

## 2023-05-17 ENCOUNTER — Other Ambulatory Visit: Payer: Self-pay | Admitting: Family Medicine

## 2023-06-15 ENCOUNTER — Ambulatory Visit: Payer: BC Managed Care – PPO | Attending: Cardiovascular Disease | Admitting: Cardiovascular Disease

## 2023-06-15 ENCOUNTER — Encounter: Payer: Self-pay | Admitting: Cardiovascular Disease

## 2023-06-15 VITALS — BP 122/70 | HR 61 | Ht 69.0 in | Wt 268.4 lb

## 2023-06-15 DIAGNOSIS — E785 Hyperlipidemia, unspecified: Secondary | ICD-10-CM | POA: Diagnosis not present

## 2023-06-15 DIAGNOSIS — I2510 Atherosclerotic heart disease of native coronary artery without angina pectoris: Secondary | ICD-10-CM | POA: Diagnosis not present

## 2023-06-15 DIAGNOSIS — R0602 Shortness of breath: Secondary | ICD-10-CM | POA: Diagnosis not present

## 2023-06-15 DIAGNOSIS — Z6839 Body mass index (BMI) 39.0-39.9, adult: Secondary | ICD-10-CM

## 2023-06-15 DIAGNOSIS — E66812 Obesity, class 2: Secondary | ICD-10-CM

## 2023-06-15 DIAGNOSIS — I10 Essential (primary) hypertension: Secondary | ICD-10-CM

## 2023-06-15 NOTE — Progress Notes (Signed)
 Cardiology Office Note   Date:  06/16/2023   ID:  Eric, Huff [birth year], MRN [id number]  PCP:  Claire Crick, MD  Cardiologist:   Antionette Kirks, MD   No chief complaint on file.     History of Present Illness: Eric Huff is a 53 y.o. male who presents for a follow-up visit regarding coronary artery disease.  He has past medical history of essential hypertension, tobacco use, sleep apnea on CPAP, gout and obesity. He presented in November 2023 with non-ST elevation myocardial infarction.  Echocardiogram showed normal LV systolic function. Cardiac catheterization  showed chronically occluded right coronary artery with well-developed left-to-right collaterals and severe stenosis affecting the left circumflex.  The LAD had mild to moderate nonobstructive disease.  I performed successful PCI and drug-eluting stent placement to the mid/distal left circumflex as well as proximal left circumflex with 2 none overlapped stents.  He quit smoking after his myocardial infarction.    He had right hand pain and swelling after cardiac catheterization without hematoma.  This was felt to be due to complex regional pain syndrome.  He was evaluated by rheumatology and received a local steroid injection with improvement.  In addition, he was treated with short-term course of colchicine .  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  He had 1 mm of ST depression with evidence of inferior wall ischemia consistent with known chronically occluded right coronary artery with collaterals.  EF was 53%.   He has been doing reasonably well with no recurrent chest pain.  However, he reports worsening exertional dyspnea.  He feels like he has to take a deep breath frequently.  He continues to struggle with gradual weight gain.  He is worried some of his medications might be causing that.  Past Medical History:  Diagnosis Date   Acute prostatitis 06/03/2022    UCx 05/2022  - pansensitive E coli   CAD (coronary artery disease)    a. 11/2021 NSTEMI/PCI: LAD 30/26m, RI min irregs, LCX 95p/m (3.0x15 Onyx Frontier DES), 17m/d (2.5x22 Onyx Frontier DES), RCA 100p/m. RPDA fills via collats from dLAD. RPL3 fills via collats from OM3; b. 01/2022 ETT: Ex time 6:34. 1mm horizontal ST depression in V5, V6.  Rec nuc stress test; c. 02/2022 MV: EF 53%, mid-basal inflat HK. Inf ischemia - felt to be 2/2 CTO RCA w/ L->R  collats.   COVID-19 05/20/2022   Drug reaction 09/07/2022   Food poisoning + Wegovy  - now off this     Gout    History of echocardiogram    a. 11/2021 Echo: EF 55-60%, no rwma, nl RV fxn, triv MR.   Hyperlipidemia LDL goal <70    Hypertension 12/26/2005   Morbid obesity (HCC)    Myocardial infarction (HCC)    Plantar fascia rupture 08/29/2010   Tobacco use    a. Smokes 1 cigar daily.    Past Surgical History:  Procedure Laterality Date   CORONARY STENT INTERVENTION N/A 12/19/2021   Procedure: CORONARY STENT INTERVENTION;  Surgeon: Wenona Hamilton, MD;  Location: [facility] INVASIVE CV LAB;  Service: Cardiovascular;  Laterality: N/A;   KNEE ARTHROSCOPY     Left and right acl    LEFT HEART CATH AND CORONARY ANGIOGRAPHY N/A 12/19/2021   Procedure: LEFT HEART CATH AND CORONARY ANGIOGRAPHY;  Surgeon: Wenona Hamilton, MD;  Location: [facility] INVASIVE CV LAB;  Service: Cardiovascular;  Laterality: N/A;   ORIF ELBOW FRACTURE     left  ORIF FEMUR FRACTURE     left   REPLACEMENT TOTAL KNEE     left   SHOULDER SURGERY  2003     Current Outpatient Medications  Medication Sig Dispense Refill   allopurinol  (ZYLOPRIM ) 300 MG tablet Take 1 tablet (300 mg total) by mouth daily. 90 tablet 3   aspirin  EC 81 MG tablet Take 81 mg by mouth daily. Swallow whole.     atorvastatin  (LIPITOR ) 80 MG tablet Take 1 tablet (80 mg total) by mouth daily. 90 tablet 3   colchicine  0.6 MG tablet Take as needed 1-2 times daily during gout flare 90 tablet 0   Cyanocobalamin   (B-12 PO) Take by mouth daily in the afternoon.     loratadine (CLARITIN) 10 MG tablet Take 10 mg by mouth daily.     losartan  (COZAAR ) 25 MG tablet Take 1 tablet (25 mg total) by mouth daily. 90 tablet 3   sildenafil  (VIAGRA ) 50 MG tablet Take 1 tablet (50 mg total) by mouth daily as needed for erectile dysfunction. 10 tablet 0   TURMERIC PO Take by mouth.     Vitamin D -Vitamin K (VITAMIN K2-VITAMIN D3 PO) Take by mouth daily at 8 pm.     Cholecalciferol (VITAMIN D3) 125 MCG (5000 UT) CAPS Take 1 capsule by mouth daily. (Patient not taking: Reported on 06/15/2023)     No current facility-administered medications for this visit.    Allergies:   Semaglutide     Social History:  The patient  reports that he has quit smoking. His smoking use included cigarettes. He has been exposed to tobacco smoke. He has quit using smokeless tobacco. He reports that he does not drink alcohol and does not use drugs.   Family History:  The patient's family history includes CAD in his paternal grandfather; CAD (age of onset: 44) in his maternal grandfather; Cancer in his maternal grandmother; Cancer (age of onset: 5) in an other family member; Diabetes in his paternal grandmother; Hypertension in his cousin, father, and paternal grandmother.    ROS:  Please see the history of present illness.   Otherwise, review of systems are positive for none.   All other systems are reviewed and negative.    PHYSICAL EXAM: VS:  BP 122/70 (BP Location: Left Arm, Patient Position: Sitting, Cuff Size: Normal)   Pulse 61   Ht 5\' 9"  (1.753 m)   Wt 268 lb 6.4 oz (121.7 kg)   SpO2 96%   BMI 39.64 kg/m  , BMI Body mass index is 39.64 kg/m. GEN: Well nourished, well developed, in no acute distress  HEENT: normal  Neck: no JVD, carotid bruits, or masses Cardiac: RRR; no murmurs, rubs, or gallops,no edema  Respiratory:  clear to auscultation bilaterally, normal work of breathing GI: soft, nontender, nondistended, + BS MS: no  deformity or atrophy  Skin: warm and dry, no rash Neuro:  Strength and sensation are intact Psych: euthymic mood, full affect    EKG:  EKG is ordered today. EKG showed: Normal sinus rhythm Incomplete right bundle branch block Inferior infarct (cited on or before 15-Dec-2022) When compared with ECG of 15-Dec-2022 16:04, No significant change was found    Recent Labs: 10/27/2022: Hemoglobin 13.7; Platelets 310.0 01/12/2023: ALT 40; BUN 17; Creatinine, Ser 1.01; Potassium 4.2; Sodium 139    Lipid Panel    Component Value Date/Time   CHOL 124 01/12/2023 1300   TRIG 127.0 01/12/2023 1300   HDL 38.40 (L) 01/12/2023 1300   CHOLHDL 3 01/12/2023  1300   VLDL 25.4 01/12/2023 1300   LDLCALC 60 01/12/2023 1300   LDLDIRECT 114.0 12/16/2021 0946      Wt Readings from Last 3 Encounters:  06/15/23 268 lb 6.4 oz (121.7 kg)  03/30/23 261 lb (118.4 kg)  01/08/23 263 lb (119.3 kg)          No data to display            ASSESSMENT AND PLAN:   1.  Coronary artery disease involving native coronary arteries without angina: He is doing well with no recurrent chest pain.  However, he reports worsening exertional dyspnea and thus we will obtain an echocardiogram.    2.  Essential hypertension: Blood pressure is well-controlled.  He is only on a small dose carvedilol  and thus we will stop this medication given concerns about continued weight gain.  3.  Hyperlipidemia: Continue high-dose atorvastatin .  Most recent lipid profile showed an LDL of 60.  4.  Obstructive sleep apnea: Doing very well with CPAP.  5.  Obesity with a BMI of 39.6.  I discussed with him the importance of healthy diet and regular exercise.    Disposition:   FU in 6 months.  Signed,  Antionette Kirks, MD  06/16/2023 1:36 PM    [facility]

## 2023-06-15 NOTE — Patient Instructions (Signed)
 Medication Instructions:  STOP the Carvedilol   *If you need a refill on your cardiac medications before your next appointment, please call your pharmacy*  Lab Work: None ordered If you have labs (blood work) drawn today and your tests are completely normal, you will receive your results only by: MyChart Message (if you have MyChart) OR A paper copy in the mail If you have any lab test that is abnormal or we need to change your treatment, we will call you to review the results.  Testing/Procedures: Your physician has requested that you have an echocardiogram. Echocardiography is a painless test that uses sound waves to create images of your heart. It provides your doctor with information about the size and shape of your heart and how well your heart's chambers and valves are working.   You may receive an ultrasound enhancing agent through an IV if needed to better visualize your heart during the echo. This procedure takes approximately one hour.  There are no restrictions for this procedure.  This will take place at [address] [facility] [address] [facility], [place] [address]  Please note: We ask at that you not bring children with you during ultrasound (echo/ vascular) testing. Due to room size and safety concerns, children are not allowed in the ultrasound rooms during exams. Our front office staff cannot provide observation of children in our lobby area while testing is being conducted. An adult accompanying a patient to their appointment will only be allowed in the ultrasound room at the discretion of the ultrasound technician under special circumstances. We apologize for any inconvenience.   Follow-Up: At [facility], you and your health needs are our priority.  As part of our continuing mission to provide you with exceptional heart care, our providers are all part of one team.  This team includes your primary Cardiologist (physician) and Advanced Practice Providers or  APPs (Physician Assistants and Nurse Practitioners) who all work together to provide you with the care you need, when you need it.  Your next appointment:   6 month(s)  Provider:   You may see Antionette Kirks, MD or one of the following Advanced Practice Providers on your designated Care Team:   Laneta Pintos, NP Gildardo Labrador, PA-C Varney Gentleman, PA-C Cadence [place], PA-C Ronald Cockayne, NP Morey Ar, NP    We recommend signing up for the patient portal called "MyChart".  Sign up information is provided on this After Visit Summary.  MyChart is used to connect with patients for Virtual Visits (Telemedicine).  Patients are able to view lab/test results, encounter notes, upcoming appointments, etc.  Non-urgent messages can be sent to your provider as well.   To learn more about what you can do with MyChart, go to [email or web address].

## 2023-06-29 ENCOUNTER — Ambulatory Visit: Attending: Cardiovascular Disease

## 2023-06-29 DIAGNOSIS — E349 Endocrine disorder, unspecified: Secondary | ICD-10-CM | POA: Diagnosis not present

## 2023-06-29 DIAGNOSIS — R7303 Prediabetes: Secondary | ICD-10-CM | POA: Diagnosis not present

## 2023-06-29 DIAGNOSIS — N529 Male erectile dysfunction, unspecified: Secondary | ICD-10-CM | POA: Diagnosis not present

## 2023-06-29 DIAGNOSIS — E785 Hyperlipidemia, unspecified: Secondary | ICD-10-CM | POA: Diagnosis not present

## 2023-06-29 DIAGNOSIS — I2510 Atherosclerotic heart disease of native coronary artery without angina pectoris: Secondary | ICD-10-CM | POA: Diagnosis not present

## 2023-06-29 DIAGNOSIS — G4733 Obstructive sleep apnea (adult) (pediatric): Secondary | ICD-10-CM | POA: Diagnosis not present

## 2023-06-29 DIAGNOSIS — R0602 Shortness of breath: Secondary | ICD-10-CM

## 2023-06-29 DIAGNOSIS — Z0000 Encounter for general adult medical examination without abnormal findings: Secondary | ICD-10-CM | POA: Diagnosis not present

## 2023-06-29 DIAGNOSIS — Z1331 Encounter for screening for depression: Secondary | ICD-10-CM | POA: Diagnosis not present

## 2023-06-29 DIAGNOSIS — I10 Essential (primary) hypertension: Secondary | ICD-10-CM | POA: Diagnosis not present

## 2023-06-29 LAB — ECHOCARDIOGRAM COMPLETE
AR max vel: 3.29 cm2
AV Area VTI: 3.25 cm2
AV Area mean vel: 3.16 cm2
AV Mean grad: 4 mmHg
AV Peak grad: 6.8 mmHg
Ao pk vel: 1.3 m/s
Area-P 1/2: 3.48 cm2
S' Lateral: 3.2 cm
Single Plane A2C EF: 51.5 %

## 2023-06-30 ENCOUNTER — Ambulatory Visit: Payer: Self-pay | Admitting: Cardiovascular Disease

## 2023-07-29 ENCOUNTER — Emergency Department
Admission: EM | Admit: 2023-07-29 | Discharge: 2023-07-29 | Disposition: A | Discharge status: Home / Self Care | Payer: Worker's Compensation | Attending: Emergency Medicine | Admitting: Emergency Medicine

## 2023-07-29 ENCOUNTER — Other Ambulatory Visit: Payer: Self-pay

## 2023-07-29 DIAGNOSIS — Y990 Civilian activity done for income or pay: Secondary | ICD-10-CM | POA: Insufficient documentation

## 2023-07-29 DIAGNOSIS — X500XXA Overexertion from strenuous movement or load, initial encounter: Secondary | ICD-10-CM | POA: Diagnosis not present

## 2023-07-29 DIAGNOSIS — S46211A Strain of muscle, fascia and tendon of other parts of biceps, right arm, initial encounter: Secondary | ICD-10-CM | POA: Insufficient documentation

## 2023-07-29 DIAGNOSIS — S4991XA Unspecified injury of right shoulder and upper arm, initial encounter: Secondary | ICD-10-CM | POA: Diagnosis present

## 2023-07-29 MED ORDER — IBUPROFEN 600 MG PO TABS: 600.0000 mg | ORAL_TABLET | Freq: Once | ORAL | Status: DC

## 2023-07-29 MED ORDER — IBUPROFEN 800 MG PO TABS
800.0000 mg | ORAL_TABLET | Freq: Once | ORAL | Status: AC
  Administered 2023-07-29: 800 mg via ORAL
  Filled 2023-07-29: qty 1

## 2023-07-29 NOTE — Discharge Instructions (Signed)
 As we discussed, we think you likely have a partial tear to your distal biceps tendon.  For now try to minimize the use of your arm and keep it in a position of comfort.  You can use the sling for comfort as well.  Try taking 600 to 800 mg of ibuprofen 3 times a day with meals.  You can also take 1000 mg of acetaminophen  (Tylenol ) every 6 hours.  You can use ice packs for comfort as well.  We recommend you call the office of Dr. Tobie first thing in the morning and schedule the next available follow-up appointment in the orthopedics clinic with him or one of his colleagues.  The orthopedic specialist will be able to advise you about additional recommended treatment and how long you need to rest your arm and/or be out of work.

## 2023-07-29 NOTE — ED Triage Notes (Signed)
 Pt reports he was lifting/pulling something heavy at work earlier [employer] and felt a pop in his right bicep and is now having pain and swelling.

## 2023-07-29 NOTE — ED Provider Notes (Signed)
 [facility] Provider Note    Event Date/Time   First MD Initiated Contact with Patient 07/29/23 [phone]     (approximate)   History   Arm Injury   HPI Eric Huff is a 53 y.o. male who presents for evaluation of pain in the inside of his right elbow.  He said he was lifting and pulling something heavy at work and then felt a pop at the base of his right bicep in the crook of his elbow.  He has a difficult time flexing his arm now and it is painful and he has had some swelling.  No numbness or tingling below.  No discoloration.  Pain is mostly when he flexes his arm although it is a little bit uncomfortable at rest.  No other injuries.     Physical Exam   Triage Vital Signs: ED Triage Vitals  Encounter Vitals Group     BP 07/29/23 0053 (!) 181/102     Girls Systolic BP Percentile --      Girls Diastolic BP Percentile --      Boys Systolic BP Percentile --      Boys Diastolic BP Percentile --      Pulse Rate 07/29/23 0053 (!) 59     Resp 07/29/23 0053 18     Temp 07/29/23 0053 98.6 F (37 C)     Temp src --      SpO2 07/29/23 0053 100 %     Weight 07/29/23 0052 120.2 kg (265 lb)     Height 07/29/23 0052 1.753 m (5' 9)     Head Circumference --      Peak Flow --      Pain Score 07/29/23 0052 4     Pain Loc --      Pain Education --      Exclude from Growth Chart --     Most recent vital signs: Vitals:   07/29/23 0053  BP: (!) 181/102  Pulse: (!) 59  Resp: 18  Temp: 98.6 F (37 C)  SpO2: 100%    General: Awake, no distress.  CV:  Good peripheral perfusion.  Resp:  Normal effort. Speaking easily and comfortably, no accessory muscle usage nor intercostal retractions.   Abd:  No distention.  Other:  No visible or gross deformity of the patient's bicep.  No tenderness to palpation at the proximal insertion point of the biceps tendon, but he has tenderness to palpation at the insertion point of the distal biceps tendon at the elbow.   Neurovascularly intact.   ED Results / Procedures / Treatments   Labs (all labs ordered are listed, but only abnormal results are displayed) Labs Reviewed - No data to display     PROCEDURES:  Critical Care performed: No  Procedures    IMPRESSION / MDM / ASSESSMENT AND PLAN / ED COURSE  I reviewed the triage vital signs and the nursing notes.                              Differential diagnosis includes, but is not limited to, biceps tendon tear, other musculoskeletal strain.  Patient's presentation is most consistent with acute, uncomplicated illness.  Interventions/Medications given:  Medications  ibuprofen (ADVIL) tablet 800 mg (800 mg Oral Given 07/29/23 0204)    (Note:  hospital course my include additional interventions and/or labs/studies not listed above.)   Strongly suspect partial tear of distal biceps  tendon.  Discussed conservative management, minimize weightbearing, RICE, and close orthopedics follow-up.  Patient understands and agrees with the plan.  Provided sling for comfort and ibuprofen.         FINAL CLINICAL IMPRESSION(S) / ED DIAGNOSES   Final diagnoses:  Rupture of right distal biceps tendon, initial encounter     Rx / DC Orders   ED Discharge Orders     None        Note:  This document was prepared using Dragon voice recognition software and may include unintentional dictation errors.   Gordan Huxley, MD 07/29/23 0530

## 2023-08-02 ENCOUNTER — Ambulatory Visit
Admission: RE | Admit: 2023-08-02 | Discharge: 2023-08-02 | Disposition: A | Discharge status: Home / Self Care | Source: Ambulatory Visit | Attending: Orthopedic Surgery | Admitting: Orthopedic Surgery

## 2023-08-02 ENCOUNTER — Other Ambulatory Visit: Payer: Self-pay | Admitting: Orthopedic Surgery

## 2023-08-02 DIAGNOSIS — M19021 Primary osteoarthritis, right elbow: Secondary | ICD-10-CM | POA: Diagnosis not present

## 2023-08-02 DIAGNOSIS — M25521 Pain in right elbow: Secondary | ICD-10-CM

## 2023-08-02 DIAGNOSIS — M25421 Effusion, right elbow: Secondary | ICD-10-CM | POA: Diagnosis not present

## 2023-08-02 DIAGNOSIS — M948X2 Other specified disorders of cartilage, upper arm: Secondary | ICD-10-CM | POA: Diagnosis not present

## 2023-08-02 DIAGNOSIS — S59901A Unspecified injury of right elbow, initial encounter: Secondary | ICD-10-CM | POA: Diagnosis not present

## 2023-08-02 MED ORDER — GADOBUTROL 1 MMOL/ML IV SOLN
10.0000 mL | Freq: Once | INTRAVENOUS | Status: AC | PRN
  Administered 2023-08-02: 10 mL via INTRAVENOUS

## 2023-08-04 DIAGNOSIS — M19021 Primary osteoarthritis, right elbow: Secondary | ICD-10-CM | POA: Diagnosis not present

## 2023-08-09 DIAGNOSIS — M24021 Loose body in right elbow: Secondary | ICD-10-CM | POA: Diagnosis not present

## 2023-08-09 DIAGNOSIS — M19021 Primary osteoarthritis, right elbow: Secondary | ICD-10-CM | POA: Diagnosis not present

## 2023-08-17 DIAGNOSIS — G4733 Obstructive sleep apnea (adult) (pediatric): Secondary | ICD-10-CM | POA: Diagnosis not present

## 2023-08-17 DIAGNOSIS — Z1211 Encounter for screening for malignant neoplasm of colon: Secondary | ICD-10-CM | POA: Diagnosis not present

## 2023-08-17 DIAGNOSIS — Z1212 Encounter for screening for malignant neoplasm of rectum: Secondary | ICD-10-CM | POA: Diagnosis not present

## 2023-08-26 LAB — COLOGUARD: COLOGUARD: NEGATIVE

## 2023-09-21 NOTE — Progress Notes (Signed)
 Office Visit Note  Patient: Eric Huff             Date of Birth: [birth year]           MRN: [id number]             PCP: Epifanio Alm SQUIBB, MD Referring: Rilla Baller, MD Visit Date: 10/05/2023   Subjective:  Follow-up   Discussed the use of AI scribe software for clinical note transcription with the patient, who gave verbal consent to proceed.  History of Present Illness   Eric Huff is a 53 y.o. male here for follow up for chronic gouty arthritis on allopurinol  300 mg daily and colchicine  0.6 mg as needed.    He has been managing his gout with allopurinol  300 mg daily and has used colchicine  once or twice in the last six months for flare-ups. He can usually predict when a flare-up is about to start and takes colchicine  to prevent it from worsening. Recently, he ate shrimp multiple times without experiencing a flare-up, which he found surprising but good. His gout symptoms typically start in his toe, but he has not experienced any major flare-ups recently.  He had recent labs checked with his internal medicine clinic in June including uric acid which was 6.5. His CBC and CMP were normal.  He discusses a separate issue with his elbow, initially thought to be a bicep tear. He describes a history of swelling and a 'catch' in his arm when using it, such as when pulling a coffee pot. Imaging including MRI in July showed osteochondral tear and severe osteoarthritis of multiple surfaces. He saw orthopedic surgery clinic with initial plan of physical therapy and may consider a guided injection if issues persist. He describes a history of arthritis and loose bodies in the joint, as seen on imaging according to his doctors.  He also mentions a history of arthritis in multiple joints, including his wrist and fingers. He uses a splint at night sometimes to prevent his fingers from bending, which has helped reduce symptoms of trigger finger.    Previous HPI 03/30/2023 Eric Huff is a 54 y.o. male here for follow up for chronic gouty arthritis on allopurinol  300 mg daily and colchicine  0.6 mg as needed.   He has experienced three small gout flare-ups since his last visit in September, primarily affecting his toes. These episodes were painful but not associated with significant swelling. He managed these episodes with colchicine , taking one dose for the first flare-up and two doses over two days for the subsequent ones. He continues to take allopurinol  for maintenance.   He identifies processed foods, particularly from fast-food chains like [employer] and [employer], as potential triggers for his gout flare-ups. He also notes that dehydration may contribute to his symptoms, as he often sweats at work and does not hydrate sufficiently.   He mentions experiencing occasional stiffness and a 'popping' sensation in his fingers, particularly in the morning or after prolonged use, such as working on the computer. This has been occurring for about a month and affects two fingers, but the sensation resolves quickly without lasting effects.   No significant fluid retention, but occasional minor swelling. No new or worsening symptoms in his wrist since receiving a prior injection, and he maintains good movement despite some osteoarthritis.       Previous HPI 09/29/2022 Eric Huff is a 53 y.o. male here for follow up for chronic gouty arthritis on allopurinol  300 mg  daily and colchicine  0.6 mg daily.  No new flares since our last visit.  He still has decreased range of motion at the right wrist has also developed an area of hypopigmentation on the back of the wrist.  He started on Wegovy  injections through PCP office but developed severe GI intolerance and discontinued this.  Blood count showed hypereosinophilia up to 7400 on August 12 downtrending at 1300 on the 20th.  He has a few areas of chronic mildly erythematous skin rash on his back and on his leg but these are unchanged  for years..   Previous HPI 06/23/2022 Eric Huff is a 53 y.o. male here for follow up for chronic gouty arthritis doing well today the previous right wrist inflammation started improving 1 day after the injection at our last visit.  He is taking the allopurinol  300 mg once daily has not noticed any problems.  Also taking the colchicine  as once daily prophylaxis now states he will need medication refills sooner with his adjustment.  He was treated with a course of Bactrim  for acute prostatitis.  He has not had any history of previous frequent genitourinary infections.   Previous HPI 05/26/22 Eric Huff is a 53 y.o. male here for right wrist pain and swelling. Symptoms particularly since NSTEMI and associated PCI via radial artery catheterization last November. 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 He has previous history of gout for about 7 years with intermittent flares in his feet and rarely in his elbows.  Since this started he has flare up every few weeks continuously and has missed a lot of work due to these attacks. He takes colchicine  as needed for flares and usually clear up within a few days. He was treated with a course of steroids at the outset with his cardiologist but not repeated within past few months. He has also noticed some increased pain in the right elbow and forearm that he thinks is tennis elbow. Currently has inflammation that increased since yesterday and progressively worsening. He just finished a course of paxlovid  for COVID infection so was avoiding colchicine  due to medication interaction.   Review of Systems  Constitutional:  Negative for fatigue.  HENT:  Negative for mouth sores and mouth dryness.   Eyes:  Negative for dryness.  Respiratory:  Negative for shortness of breath.   Cardiovascular:  Negative for  chest pain and palpitations.  Gastrointestinal:  Negative for blood in stool, constipation and diarrhea.  Endocrine: Negative for increased urination.  Genitourinary:  Negative for involuntary urination.  Musculoskeletal:  Negative for joint pain, gait problem, joint pain, joint swelling, myalgias, muscle weakness, morning stiffness, muscle tenderness and myalgias.  Skin:  Negative for color change, rash, hair loss and sensitivity to sunlight.  Allergic/Immunologic: Negative for susceptible to infections.  Neurological:  Negative for dizziness and headaches.  Hematological:  Negative for swollen glands.  Psychiatric/Behavioral:  Negative for depressed mood and sleep disturbance. The patient is not nervous/anxious.   All other systems reviewed and are negative.   PMFS History:  Patient Active Problem List   Diagnosis Date Noted   Trigger finger, right ring finger 03/30/2023   Skin lesion of right lower extremity 01/11/2023   Osteoarthritis of multiple joints 09/29/2022   Eosinophilia 09/10/2022   Inguinal hernia bilateral, non-recurrent 09/07/2022   Pars defect with spondylolisthesis 09/07/2022   Diverticulosis 09/07/2022   Transaminitis  07/01/2022   OSA on CPAP 06/30/2022   Lump in chest 01/01/2022   Tinnitus of both ears 12/27/2021   CAD (coronary artery disease) 12/17/2021   Kidney stone 05/16/2020   Tinea corporis 02/09/2020   Vitamin D  deficiency 12/20/2019   Chronic gout 07/05/2019   Hypotestosteronism 11/02/2016   Health maintenance examination 10/19/2014   Dyslipidemia 04/17/2014   Severe obesity (BMI 35.0-39.9) with comorbidity (HCC) 03/23/2014   Erectile dysfunction 03/23/2014   GERD 03/21/2008   Essential hypertension 05/06/2006   Prediabetes 05/06/2006    Past Medical History:  Diagnosis Date   Acute prostatitis 06/03/2022   UCx 05/2022  - pansensitive E coli   CAD (coronary artery disease)    a. 11/2021 NSTEMI/PCI: LAD 30/8m, RI min irregs, LCX 95p/m (3.0x15  Onyx Frontier DES), 34m/d (2.5x22 Onyx Frontier DES), RCA 100p/m. RPDA fills via collats from dLAD. RPL3 fills via collats from OM3; b. 01/2022 ETT: Ex time 6:34. 1mm horizontal ST depression in V5, V6.  Rec nuc stress test; c. 02/2022 MV: EF 53%, mid-basal inflat HK. Inf ischemia - felt to be 2/2 CTO RCA w/ L->R  collats.   COVID-19 05/20/2022   Drug reaction 09/07/2022   Food poisoning + Wegovy  - now off this     Gout    History of echocardiogram    a. 11/2021 Echo: EF 55-60%, no rwma, nl RV fxn, triv MR.   Hyperlipidemia LDL goal <70    Hypertension 12/26/2005   Morbid obesity (HCC)    Myocardial infarction (HCC)    Plantar fascia rupture 08/29/2010   Tobacco use    a. Smokes 1 cigar daily.    Family History  Problem Relation Age of Onset   Hypertension Father    CAD Maternal Grandfather 62       massive MI   CAD Paternal Grandfather        MI and CABG   Hypertension Paternal Grandmother    Diabetes Paternal Grandmother    Hypertension Cousin    Cancer Maternal Grandmother        breast   Cancer Other 5       niece with rhabdomyosarcoma   Past Surgical History:  Procedure Laterality Date   CORONARY STENT INTERVENTION N/A 12/19/2021   Procedure: CORONARY STENT INTERVENTION;  Surgeon: Darron Deatrice LABOR, MD;  Location: [facility];  Service: Cardiovascular;  Laterality: N/A;   KNEE ARTHROSCOPY     Left and right acl    LEFT HEART CATH AND CORONARY ANGIOGRAPHY N/A 12/19/2021   Procedure: LEFT HEART CATH AND CORONARY ANGIOGRAPHY;  Surgeon: Darron Deatrice LABOR, MD;  Location: [facility];  Service: Cardiovascular;  Laterality: N/A;   ORIF ELBOW FRACTURE     left   ORIF FEMUR FRACTURE     left   REPLACEMENT TOTAL KNEE     left   SHOULDER SURGERY  2003   Social History   Social History Narrative   ** Merged History Encounter **       [name] Lives with wife, youngest daughter, 2 dogs, bearded dragon Occupation: Works as a Transport planner, second  shift Activity: no regular exercise, some yardwork  Diet: good water, fruits/vegetables daily, smaller portions more frequency   Immunization History  Administered Date(s) Administered   Td 01/26/1997, 01/27/2003   Tdap 11/10/2018   Zoster Recombinant(Shingrix ) 12/24/2020, 03/13/2021     Objective: Vital Signs: BP 131/79 (BP Location: Left Arm, Patient Position: Sitting, Cuff Size: Normal)   Pulse 79  Temp (!) 97.5 F (36.4 C)   Resp 16   Ht 5' 9 (1.753 m)   Wt 272 lb 9.6 oz (123.7 kg)   BMI 40.26 kg/m    Physical Exam Eyes:     Conjunctiva/sclera: Conjunctivae normal.  Cardiovascular:     Rate and Rhythm: Normal rate and regular rhythm.  Pulmonary:     Effort: Pulmonary effort is normal.     Breath sounds: Normal breath sounds.  Musculoskeletal:     Right lower leg: No edema.     Left lower leg: No edema.  Lymphadenopathy:     Cervical: No cervical adenopathy.  Skin:    General: Skin is warm and dry.     Findings: No rash.  Neurological:     Mental Status: He is alert.  Psychiatric:        Mood and Affect: Mood normal.      Musculoskeletal Exam:  Mild restriction shoulder and elbow ROM with hard endpoint, without tenderness to pressure or swelling Right wrist mild restriction in extension ROM, no focal tenderness Right elbow minimal tenderness, no palpable effusion, left elbow extension ROM reduced about 10-15 degrees hard endpoint Fingers full ROM, some chronic bony widening of MCPs, no focal tenderness or swelling, no reproducible triggering or palpable nodules Knees bilateral replacement scars, full ROM no tenderness or swelling Ankles full ROM no tenderness or swelling Mild bony MTP nodule, flat feet, no palpable swelling or tenderness  Investigation: No additional findings.  Imaging: No results found.  Recent Labs: Lab Results  Component Value Date   WBC 7.0 10/27/2022   HGB 13.7 10/27/2022   PLT 310.0 10/27/2022   NA 139 01/12/2023   K 4.2  01/12/2023   CL 103 01/12/2023   CO2 30 01/12/2023   GLUCOSE 96 01/12/2023   BUN 17 01/12/2023   CREATININE 1.01 01/12/2023   BILITOT 0.7 01/12/2023   ALKPHOS 103 01/12/2023   AST 31 01/12/2023   ALT 40 01/12/2023   PROT 6.7 01/12/2023   ALBUMIN 4.4 01/12/2023   CALCIUM  9.3 01/12/2023   GFRAA 97 02/15/2006    Speciality Comments: No specialty comments available.  Procedures:  No procedures performed Allergies: Semaglutide    Assessment & Plan Idiopathic chronic gout of multiple sites without tophus Gout well-controlled on allopurinol  300 mg daily, uric acid at 6.5 mg/dL. CBC and CMP also reviewed and were normal. No major flare-ups in six months. Colchicine  effective for minor flare-ups. Tolerated shrimp without flare-up, indicating good control. Discussed target of 6 but with current minimal disease activity will just maintain at current dosing. - Continue allopurinol  300 mg daily. - Use colchicine  as needed for flare-ups. Orders:   allopurinol  (ZYLOPRIM ) 300 MG tablet; Take 1 tablet (300 mg total) by mouth daily.  Primary osteoarthritis involving multiple joints Osteoarthritis in multiple joints with bone-on-bone articulation. Family history suggests genetic component. Managed with lifestyle modifications and splints for trigger finger. Symptoms improve in cooler weather.    Trigger finger, right ring finger Symptoms managed with night splints to prevent fingers from staying bent. - Continue using splints at night as needed.    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 Previous imaging ruled out bicep tear. Conservative management with physical therapy ongoing. Surgery not recommended due to complications and limited benefit. Risks include decreased range of motion and potential future joint replacement. Guided steroid injection may be considered if symptoms persist. - Continue physical therapy. - Consider guided  steroid injection  if symptoms persist - through orthopedics office with workcomp case       Orders: No orders of the defined types were placed in this encounter.  Meds ordered this encounter  Medications   allopurinol  (ZYLOPRIM ) 300 MG tablet    Sig: Take 1 tablet (300 mg total) by mouth daily.    Dispense:  90 tablet    Refill:  3     Follow-Up Instructions: Return in about 1 year (around 10/04/2024) for Gout on allopurinol  f/u 38yr.   Lonni LELON Ester, MD  Note - This record has been created using [employer].  Chart creation errors have been sought, but may not always  have been located. Such creation errors do not reflect on  the standard of medical care.

## 2023-10-05 ENCOUNTER — Encounter: Payer: Self-pay | Admitting: Internal Medicine

## 2023-10-05 ENCOUNTER — Ambulatory Visit: Attending: Internal Medicine | Admitting: Internal Medicine

## 2023-10-05 VITALS — BP 131/79 | HR 79 | Temp 97.5°F | Resp 16 | Ht 69.0 in | Wt 272.6 lb

## 2023-10-05 DIAGNOSIS — M24021 Loose body in right elbow: Secondary | ICD-10-CM | POA: Diagnosis not present

## 2023-10-05 DIAGNOSIS — M150 Primary generalized (osteo)arthritis: Secondary | ICD-10-CM

## 2023-10-05 DIAGNOSIS — M65341 Trigger finger, right ring finger: Secondary | ICD-10-CM

## 2023-10-05 DIAGNOSIS — M1A09X0 Idiopathic chronic gout, multiple sites, without tophus (tophi): Secondary | ICD-10-CM | POA: Diagnosis not present

## 2023-10-05 MED ORDER — ALLOPURINOL 300 MG PO TABS: 300.0000 mg | ORAL_TABLET | Freq: Every day | ORAL | 3 refills | Status: AC

## 2023-10-05 NOTE — Assessment & Plan Note (Signed)
 Symptoms managed with night splints to prevent fingers from staying bent. - Continue using splints at night as needed.

## 2023-10-05 NOTE — Assessment & Plan Note (Signed)
 Gout well-controlled on allopurinol  300 mg daily, uric acid at 6.5 mg/dL. CBC and CMP also reviewed and were normal. No major flare-ups in six months. Colchicine  effective for minor flare-ups. Tolerated shrimp without flare-up, indicating good control. Discussed target of 6 but with current minimal disease activity will just maintain at current dosing. - Continue allopurinol  300 mg daily. - Use colchicine  as needed for flare-ups. Orders:   allopurinol  (ZYLOPRIM ) 300 MG tablet; Take 1 tablet (300 mg total) by mouth daily.

## 2023-10-05 NOTE — Assessment & Plan Note (Signed)
 Osteoarthritis in multiple joints with bone-on-bone articulation. Family history suggests genetic component. Managed with lifestyle modifications and splints for trigger finger. Symptoms improve in cooler weather.

## 2023-12-14 DIAGNOSIS — R7303 Prediabetes: Secondary | ICD-10-CM | POA: Diagnosis not present

## 2023-12-14 DIAGNOSIS — R5383 Other fatigue: Secondary | ICD-10-CM | POA: Diagnosis not present

## 2023-12-16 DIAGNOSIS — I10 Essential (primary) hypertension: Secondary | ICD-10-CM | POA: Diagnosis not present

## 2023-12-16 DIAGNOSIS — R5383 Other fatigue: Secondary | ICD-10-CM | POA: Diagnosis not present

## 2023-12-16 DIAGNOSIS — R7303 Prediabetes: Secondary | ICD-10-CM | POA: Diagnosis not present

## 2023-12-16 DIAGNOSIS — I2510 Atherosclerotic heart disease of native coronary artery without angina pectoris: Secondary | ICD-10-CM | POA: Diagnosis not present

## 2023-12-31 DIAGNOSIS — R7303 Prediabetes: Secondary | ICD-10-CM | POA: Diagnosis not present

## 2023-12-31 DIAGNOSIS — I10 Essential (primary) hypertension: Secondary | ICD-10-CM | POA: Diagnosis not present

## 2023-12-31 DIAGNOSIS — R5383 Other fatigue: Secondary | ICD-10-CM | POA: Diagnosis not present

## 2023-12-31 DIAGNOSIS — I2510 Atherosclerotic heart disease of native coronary artery without angina pectoris: Secondary | ICD-10-CM | POA: Diagnosis not present

## 2024-01-06 ENCOUNTER — Other Ambulatory Visit (HOSPITAL_COMMUNITY): Payer: Self-pay

## 2024-01-11 ENCOUNTER — Encounter: Payer: BC Managed Care – PPO | Admitting: Family Medicine

## 2024-02-09 ENCOUNTER — Other Ambulatory Visit: Payer: Self-pay | Admitting: Cardiovascular Disease

## 2024-02-09 NOTE — Telephone Encounter (Signed)
 Lipid panel done 06/29/23

## 2024-10-03 ENCOUNTER — Ambulatory Visit: Admitting: Internal Medicine
